# Patient Record
Sex: Male | Born: 1960 | Race: Black or African American | Hispanic: No | Smoking: Current every day smoker
Health system: Southern US, Community
[De-identification: ages and names within clinical notes are randomized; demographics above are authoritative.]

## PROBLEM LIST (undated history)

## (undated) DIAGNOSIS — R569 Unspecified convulsions: Secondary | ICD-10-CM

## (undated) DIAGNOSIS — I10 Essential (primary) hypertension: Secondary | ICD-10-CM

## (undated) DIAGNOSIS — F10239 Alcohol dependence with withdrawal, unspecified: Secondary | ICD-10-CM

## (undated) DIAGNOSIS — F10939 Alcohol use, unspecified with withdrawal, unspecified: Secondary | ICD-10-CM

## (undated) DIAGNOSIS — F1010 Alcohol abuse, uncomplicated: Secondary | ICD-10-CM

## (undated) DIAGNOSIS — F1920 Other psychoactive substance dependence, uncomplicated: Secondary | ICD-10-CM

## (undated) HISTORY — PX: BACK SURGERY: SHX140

---

## 2018-10-21 ENCOUNTER — Encounter: Payer: Self-pay | Admitting: Pediatric Intensive Care

## 2018-10-26 ENCOUNTER — Encounter: Payer: Self-pay | Admitting: Pediatric Intensive Care

## 2018-10-26 NOTE — Congregational Nurse Program (Signed)
  Dept: [facility] Nurse Program Note  Date of Encounter: 10/21/2018  Past Medical History: No past medical history on file.  Encounter Details:new client encounter at [facility]. Client has come here from [facility]. He has a long history of alcohol use including admission at detox programs. He has not had a drink in 3 days and states that he is in withdrawal. CIWA 29 with elevated BP. Client referred to [facility] clinic for treatment. Report to [facility] Placey NNP. Lisette Abu RN BSN CNP [phone]

## 2018-11-06 ENCOUNTER — Encounter (HOSPITAL_COMMUNITY): Payer: Self-pay

## 2018-11-06 ENCOUNTER — Other Ambulatory Visit: Payer: Self-pay

## 2018-11-06 ENCOUNTER — Emergency Department (HOSPITAL_COMMUNITY)
Admission: EM | Admit: 2018-11-06 | Discharge: 2018-11-07 | Disposition: A | Discharge status: Home / Self Care | Payer: No Typology Code available for payment source | Attending: Emergency Medicine | Admitting: Emergency Medicine

## 2018-11-06 ENCOUNTER — Emergency Department (HOSPITAL_COMMUNITY): Payer: Self-pay

## 2018-11-06 DIAGNOSIS — F1010 Alcohol abuse, uncomplicated: Secondary | ICD-10-CM | POA: Insufficient documentation

## 2018-11-06 DIAGNOSIS — Z590 Homelessness: Secondary | ICD-10-CM | POA: Insufficient documentation

## 2018-11-06 DIAGNOSIS — R072 Precordial pain: Secondary | ICD-10-CM

## 2018-11-06 DIAGNOSIS — R7989 Other specified abnormal findings of blood chemistry: Secondary | ICD-10-CM | POA: Insufficient documentation

## 2018-11-06 DIAGNOSIS — F329 Major depressive disorder, single episode, unspecified: Secondary | ICD-10-CM | POA: Insufficient documentation

## 2018-11-06 DIAGNOSIS — F332 Major depressive disorder, recurrent severe without psychotic features: Secondary | ICD-10-CM | POA: Diagnosis present

## 2018-11-06 DIAGNOSIS — F17210 Nicotine dependence, cigarettes, uncomplicated: Secondary | ICD-10-CM | POA: Insufficient documentation

## 2018-11-06 DIAGNOSIS — R45851 Suicidal ideations: Secondary | ICD-10-CM | POA: Insufficient documentation

## 2018-11-06 DIAGNOSIS — R778 Other specified abnormalities of plasma proteins: Secondary | ICD-10-CM

## 2018-11-06 DIAGNOSIS — F1410 Cocaine abuse, uncomplicated: Secondary | ICD-10-CM | POA: Diagnosis present

## 2018-11-06 DIAGNOSIS — I10 Essential (primary) hypertension: Secondary | ICD-10-CM | POA: Insufficient documentation

## 2018-11-06 DIAGNOSIS — Y903 Blood alcohol level of 60-79 mg/100 ml: Secondary | ICD-10-CM | POA: Insufficient documentation

## 2018-11-06 DIAGNOSIS — R0789 Other chest pain: Secondary | ICD-10-CM | POA: Insufficient documentation

## 2018-11-06 HISTORY — DX: Essential (primary) hypertension: I10

## 2018-11-06 LAB — TROPONIN I
Troponin I: 0.04 ng/mL (ref <0.03)
Troponin I: 0.06 ng/mL (ref <0.03)
Troponin I: 0.06 ng/mL (ref <0.03)

## 2018-11-06 LAB — COMPREHENSIVE METABOLIC PANEL
ALT: 21 U/L (ref 0–44)
AST: 32 U/L (ref 15–41)
Albumin: 3.5 g/dL (ref 3.5–5.0)
Alkaline Phosphatase: 47 U/L (ref 38–126)
Anion gap: 15 (ref 5–15)
BUN: 7 mg/dL (ref 6–20)
CO2: 22 mmol/L (ref 22–32)
Calcium: 8.8 mg/dL — ABNORMAL LOW (ref 8.9–10.3)
Chloride: 101 mmol/L (ref 98–111)
Creatinine, Ser: 0.92 mg/dL (ref 0.61–1.24)
GFR calc Af Amer: >60 mL/min (ref >60)
GFR calc non Af Amer: >60 mL/min (ref >60)
Glucose, Bld: 81 mg/dL (ref 70–99)
Potassium: 3.7 mmol/L (ref 3.5–5.1)
Sodium: 138 mmol/L (ref 135–145)
Total Bilirubin: 0.6 mg/dL (ref 0.3–1.2)
Total Protein: 7.4 g/dL (ref 6.5–8.1)

## 2018-11-06 LAB — CBC WITH DIFFERENTIAL/PLATELET
Abs Immature Granulocytes: 0.03 10*3/uL (ref 0.00–0.07)
Basophils Absolute: 0 10*3/uL (ref 0.0–0.1)
Basophils Relative: 0 %
Eosinophils Absolute: 0.1 10*3/uL (ref 0.0–0.5)
Eosinophils Relative: 1 %
HCT: 38.4 % — ABNORMAL LOW (ref 39.0–52.0)
Hemoglobin: 12.4 g/dL — ABNORMAL LOW (ref 13.0–17.0)
Immature Granulocytes: 0 %
Lymphocytes Relative: 21 %
Lymphs Abs: 2.2 10*3/uL (ref 0.7–4.0)
MCH: 28.8 pg (ref 26.0–34.0)
MCHC: 32.3 g/dL (ref 30.0–36.0)
MCV: 89.1 fL (ref 80.0–100.0)
Monocytes Absolute: 1.3 10*3/uL — ABNORMAL HIGH (ref 0.1–1.0)
Monocytes Relative: 12 %
Neutro Abs: 6.9 10*3/uL (ref 1.7–7.7)
Neutrophils Relative %: 66 %
Platelets: 269 10*3/uL (ref 150–400)
RBC: 4.31 MIL/uL (ref 4.22–5.81)
RDW: 13.8 % (ref 11.5–15.5)
WBC: 10.6 10*3/uL — ABNORMAL HIGH (ref 4.0–10.5)
nRBC: 0 % (ref 0.0–0.2)

## 2018-11-06 LAB — RAPID URINE DRUG SCREEN, HOSP PERFORMED
Amphetamines: NOT DETECTED
Barbiturates: NOT DETECTED
Benzodiazepines: POSITIVE — AB
Cocaine: POSITIVE — AB
Opiates: NOT DETECTED
Tetrahydrocannabinol: NOT DETECTED

## 2018-11-06 LAB — ETHANOL: Alcohol, Ethyl (B): 70 mg/dL — ABNORMAL HIGH (ref <10)

## 2018-11-06 MED ORDER — LORAZEPAM 1 MG PO TABS
1.0000 mg | ORAL_TABLET | Freq: Once | ORAL | Status: AC
  Administered 2018-11-06: 1 mg via ORAL
  Filled 2018-11-06: qty 1

## 2018-11-06 MED ORDER — HYDROXYZINE HCL 25 MG PO TABS
25.0000 mg | ORAL_TABLET | Freq: Once | ORAL | Status: AC
  Administered 2018-11-06: 16:00:00 25 mg via ORAL
  Filled 2018-11-06: qty 1

## 2018-11-06 NOTE — ED Provider Notes (Signed)
[facility] EMERGENCY DEPARTMENT Provider Note   CSN: [id number] Arrival date & time: 11/06/18  8315    History   Chief Complaint Chief Complaint  Patient presents with  . Suicidal    HPI  Alvin Warren is a 58 y.o. male who presents with SI. PMH significant for HTN, "enlarged heart", polysubstance abuse. He states that he "just can't do it anymore". He is from [place]. 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 His daughter, who lives here in [place], bought him a Psychiatric nurse here. He is homeless and was staying at [facility] but he was moved to a [employer] due to [employer]. He has been using alcohol and cocaine. He last used cocaine last night and drank this morning. He denies self injury but states that he would jump in front of a car. He denies any access to weapons. He denies fever, URI symptoms, cough, SOB, abdominal pain, diarrhea. He was tested for flu and COVID for screening but denies any symptoms. He also reports some dull central chest pain that has been intermittent for days. Nothing makes it better or worse. It's "eased off" some at this point. He has some N/V last night. He is supposed to take antihypertensives and mental health meds but hasn't been taking them.      HPI  Past Medical History:  Diagnosis Date  . Hypertension     There are no active problems to display for this patient.   History reviewed. No pertinent surgical history.      Home Medications    Prior to Admission medications   Not on File    Family History No family history on file.  Social History Social History   Tobacco Use  . Smoking status: Current Every Day Smoker    Packs/day: 0.50    Types: Cigarettes  Substance Use Topics  . Alcohol use: Yes  . Drug use: Yes    Types: Cocaine    Comment: last used crack cocaine last night     Allergies   Patient has no allergy information on record.   Review of Systems Review of Systems  Constitutional: Negative for fever.   Respiratory: Negative for cough and shortness of breath.   Cardiovascular: Positive for chest pain. Negative for palpitations and leg swelling.  Gastrointestinal: Positive for vomiting. Negative for abdominal pain, diarrhea and nausea.  Psychiatric/Behavioral: Positive for dysphoric mood and suicidal ideas. Negative for self-injury.  All other systems reviewed and are negative.    Physical Exam Updated Vital Signs BP (!) 145/88   Pulse 82   Temp 98.4 F (36.9 C) (Oral)   Resp 16   Ht 6\' 3"  (1.905 m)   Wt 99.8 kg   SpO2 96%   BMI 27.50 kg/m   Physical Exam Vitals signs and nursing note reviewed.  Constitutional:      General: He is not in acute distress.    Appearance: Normal appearance. He is well-developed. He is not ill-appearing.  HENT:     Head: Normocephalic and atraumatic.  Eyes:     General: No scleral icterus.       Right eye: No discharge.        Left eye: No discharge.     Conjunctiva/sclera: Conjunctivae normal.     Pupils: Pupils are equal, round, and reactive to light.  Neck:     Musculoskeletal: Normal range of motion.  Cardiovascular:     Rate and Rhythm: Normal rate and regular rhythm.  Pulmonary:  Effort: Pulmonary effort is normal. No respiratory distress.     Breath sounds: Normal breath sounds.  Abdominal:     General: There is no distension.     Palpations: Abdomen is soft.     Tenderness: There is no abdominal tenderness.  Skin:    General: Skin is warm and dry.  Neurological:     Mental Status: He is alert and oriented to person, place, and time.  Psychiatric:        Mood and Affect: Mood is depressed. Affect is flat.        Speech: Speech is delayed (soft spoken).        Behavior: Behavior is slowed. Behavior is cooperative.        Thought Content: Thought content includes suicidal ideation. Thought content includes suicidal plan.        Cognition and Memory: Cognition is impaired.      ED Treatments / Results  Labs (all labs  ordered are listed, but only abnormal results are displayed) Labs Reviewed  COMPREHENSIVE METABOLIC PANEL - Abnormal; Notable for the following components:      Result Value   Calcium 8.8 (*)    All other components within normal limits  ETHANOL - Abnormal; Notable for the following components:   Alcohol, Ethyl (B) 70 (*)    All other components within normal limits  RAPID URINE DRUG SCREEN, HOSP PERFORMED - Abnormal; Notable for the following components:   Cocaine POSITIVE (*)    Benzodiazepines POSITIVE (*)    All other components within normal limits  CBC WITH DIFFERENTIAL/PLATELET - Abnormal; Notable for the following components:   WBC 10.6 (*)    Hemoglobin 12.4 (*)    HCT 38.4 (*)    Monocytes Absolute 1.3 (*)    All other components within normal limits  TROPONIN I - Abnormal; Notable for the following components:   Troponin I 0.04 (*)    All other components within normal limits  TROPONIN I - Abnormal; Notable for the following components:   Troponin I 0.06 (*)    All other components within normal limits  TROPONIN I    EKG EKG Interpretation  Date/Time:  Friday November 06 2018 08:55:13 EDT Ventricular Rate:  81 PR Interval:    QRS Duration: 87 QT Interval:  369 QTC Calculation: 429 R Axis:   36 Text Interpretation:  Sinus rhythm Left atrial enlargement no prior to compare with Confirmed by Aletta Edouard [phone]) on 11/06/2018 8:59:25 AM   Radiology Dg Chest 2 View  Result Date: 11/06/2018 CLINICAL DATA:  Chest pain EXAM: CHEST - 2 VIEW COMPARISON:  None. FINDINGS: Lungs are clear. Heart is borderline enlarged with pulmonary vascularity normal. No adenopathy. No pneumothorax. No bone lesions. IMPRESSION: Borderline cardiac enlargement.  No edema or consolidation. Electronically Signed   By: Lowella Grip III M.D.   On: 11/06/2018 09:32    Procedures Procedures (including critical care time)  Medications Ordered in ED Medications  hydrOXYzine  (ATARAX/VISTARIL) tablet 25 mg (has no administration in time range)     Initial Impression / Assessment and Plan / ED Course  I have reviewed the triage vital signs and the nursing notes.  Pertinent labs & imaging results that were available during my care of the patient were reviewed by me and considered in my medical decision making (see chart for details).  58 year old male presents with SI. He mentions some chest pain that he's been having on and off for several days which  sounds atypical in nature. He endorses cocaine use along with alcohol. He is hypertensive but otherwise vitals are normal. EKG is SR. CXR shows borderline cardiac enlargement. Initial trop is 0.04. Labs are unremarkable. UDS shows cocaine and benzos. ETOH is 70.  2nd trop is 0.06. Discussed with Cardiology - they do not feel he needs any further cardiac work up and recommend to encourage cocaine cessation. Discussed this with patient. He wants to stop his substance abuse but he has used for years. 3rd Trop is pending at shift change. Care signed out to Dr. Sabra Heck.  Final Clinical Impressions(s) / ED Diagnoses   Final diagnoses:  Suicidal ideation  Cocaine abuse ([place])  Alcohol abuse  Elevated troponin    ED Discharge Orders    None       Recardo Evangelist, PA-C 11/06/18 1600    Noemi Chapel, MD 11/06/18 Kevan Ny    Noemi Chapel, MD 11/06/18 2046

## 2018-11-06 NOTE — ED Notes (Signed)
Dr. Butler notified of elevated troponin 

## 2018-11-06 NOTE — ED Triage Notes (Addendum)
Pt from [facility] via ems; usually stays at [facility], was moved to [facility] due to [employer]; hx depression, increased in last couple of days; binge drinking last night, used crack cocaine yesterday; denies daily drinking;  pt also c/o central CP x a couple of days, non-radiating, dull pressure; pt stated to ems that he "just wants to be done, doesn't want to live anymore"; no hx suicide attempt; current plan to jump in traffic; tested for flu and covid-19 recently, results negative; hx HTN, last took lisinopril 2-3 days ago, states "I didn't bring it with me"  170/102 HR 90 98.8f temporal

## 2018-11-06 NOTE — ED Notes (Signed)
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

## 2018-11-06 NOTE — ED Notes (Signed)
Pt was out in lobby and looking for ride to [facility].  Spoke with patient and he said he came in with SI and Chest Pain.  Pt said he is suicidal and trying to get to [facility] because he was told we don't do that here. Reviewed chart and patient has had sitter the entire time and no TTS or note from behavioral health.  Undischarged patient and brought back into [facility]. Pt was not refusing care.

## 2018-11-06 NOTE — ED Notes (Signed)
Pt A&O x 3, no acute distress noted, calm & cooperative, presents with SI, plan to run in front of car.  Feeling hopeless.  Denies previous SI attempts.  Denies HI or AVH.  Pt is homeless.  Monitoring for safety, !-1 sitter at bedside.

## 2018-11-06 NOTE — ED Triage Notes (Signed)
Pt seen earlier for chest pain and SI. Pt continues with SI and feeling hopeless. Pt endorses ETOH abuse with last drink yesterday morning. Pt endorses specific plan for SI. No pain at this time

## 2018-11-06 NOTE — ED Notes (Signed)
Patients wallet returned- number on valuables envelope [phone]

## 2018-11-06 NOTE — Discharge Instructions (Addendum)
Please stop using Cocaine immediately Reduce the amount of alcohol that you drink Please see the doctor at the clinic above - call today for appointment ER for worsening pain in the chest  Substance Abuse Treatment Programs  Intensive Outpatient Programs [facility]     [address], [place]       [facility] [place] [address] [place], [place]  [facility]     (Inpatient and outpatient)     [address]           [place] [phone] (Suboxone and Methadone)  [place], [place] [address]      [place] [place] [address] [place], [place]  [facility] [place] [facility] (Outpatient/Inpatient, [place])    (insurance only) [phone]             [facility] ([place]) [place], [place]     [facility]     [address]     [place], [place]       [facility] (for caregivers and family) [phone] [address] Kristeen Mans. [place], [place]      Residential Treatment Programs [facility]      [address] [place], [place] [address]  [phone]       [place] [place], [place] [address] [phone]  [facility]        [phone]       [facility] [phone]  [facility] ([place].)             [place], [place] or [place] of [place], [address] [phone]  [facility]    [address]      [place], [place]       [facility] [address] [place], [place]  Star   [address] [place], [place] [address]     [phone]      Admissions: 8am-3pm M-F  [facility] (RTS) [address] [place], [place]  [facility] Program: Residential Program [phone] Days)   [place], [place] or [phone]     [facility]: [facility] [place], [place] (Walk in Hours over the weekend or by referral)  [facility] [address] [place], [place], [place] [address] [phone]  Crisis Mobile: [facility]:  [phone] (for crisis response 24 hours a day) [facility] Hotline:      [phone] Outpatient Psychiatry and Counseling  [facility]: Mobile Crisis Management 24 hours:  [phone]  [facility] [employer] sliding scale fee and walk in schedule: M-F 8am-12pm/1pm-3pm [place], [place] [address] [place], [place] [address] [phone]  [facility] ([facility])- new patient walk-in appointments available Monday - Friday 8am -3pm.          [address] [place], [place] [address] [phone] or crisis line- [place] [facility]/ Intensive Outpatient Therapy Program [place], [place] [address] [place]      [phone] [address] [place], [place] [address]                 [place]   [facility] [phone]. [place], [place] [address]   [employer] [facility] of Care          [address] Johnette Abraham  [place], [place] [address]       [phone]  [facility] [address], [place], [place] [address] [phone]  [facility]    [address] [place],  [place] [address]     [place], [place] Joycelyn Man     [place] [address]     [phone]       [facility] [place] [address]  [facility]     [address], [place], [place], [place] [address] [place]     [address]     [place], [place] [address]     [phone]       [facility] [address] [place], [place] [address] [phone] Resources for Temporary Residential Assistance/Crisis [facility]) M-F 8am-3pm   [address], [place] [address]   [phone] Services include: laundry, barbering, support groups, case management, phone  & computer access, showers, AA/NA mtgs, mental health/substance abuse nurse, job skills class, disability information, VA assistance, spiritual classes, etc.   [facility]   [address] [place]     [place]              [employer] (women and children)       [place]. [place],  [address] [phone] [email or web address] for application and process Application Required  [facility]   [address]    [place] [address]     [phone]                    [place] [address] [place],  [address] [phone]  [phone](ORVIFBPP application appt.) Application Required  [facility] (women only)    [place], [place] [address]     [phone]      Intake starts 6pm daily Need valid ID, SSC, & Police report [facility] [address] [place], [place] [phone] Application Required  [employer] (men only)     [place].      [place], [place]       [facility] (Pregnant women only) [address] [place], [place]  [facility]      [address] Dan[address].      [place], [place] [address]     [phone]             [facility] [address] [place], [place] 90 day commitment/SA/Application process  [facility](men only)     [address]     [place], [place]       Check-in at [facility] [address] [place], [place] [address] [phone] Men/Women/Women and Children must be there by 7 pm  [place], [place]

## 2018-11-06 NOTE — ED Notes (Signed)
Patient transported to X-ray 

## 2018-11-06 NOTE — ED Notes (Signed)
TTS consult in progress/Telepsych by Lytle Michaels at present.

## 2018-11-07 ENCOUNTER — Encounter (HOSPITAL_COMMUNITY): Payer: Self-pay

## 2018-11-07 ENCOUNTER — Other Ambulatory Visit: Payer: Self-pay

## 2018-11-07 ENCOUNTER — Inpatient Hospital Stay (HOSPITAL_COMMUNITY)
Admission: AD | Admit: 2018-11-07 | Discharge: 2018-11-12 | DRG: 885 | Disposition: A | Discharge status: Other Acute Inpatient Hospital | Payer: No Typology Code available for payment source | Source: Intra-hospital | Attending: Psychiatry | Admitting: Psychiatry

## 2018-11-07 DIAGNOSIS — F4310 Post-traumatic stress disorder, unspecified: Secondary | ICD-10-CM | POA: Diagnosis present

## 2018-11-07 DIAGNOSIS — F1424 Cocaine dependence with cocaine-induced mood disorder: Secondary | ICD-10-CM | POA: Diagnosis not present

## 2018-11-07 DIAGNOSIS — F332 Major depressive disorder, recurrent severe without psychotic features: Secondary | ICD-10-CM | POA: Diagnosis present

## 2018-11-07 DIAGNOSIS — Z79899 Other long term (current) drug therapy: Secondary | ICD-10-CM | POA: Diagnosis not present

## 2018-11-07 DIAGNOSIS — F1020 Alcohol dependence, uncomplicated: Secondary | ICD-10-CM | POA: Diagnosis present

## 2018-11-07 DIAGNOSIS — F17210 Nicotine dependence, cigarettes, uncomplicated: Secondary | ICD-10-CM | POA: Diagnosis present

## 2018-11-07 DIAGNOSIS — Z590 Homelessness: Secondary | ICD-10-CM

## 2018-11-07 DIAGNOSIS — R45851 Suicidal ideations: Secondary | ICD-10-CM | POA: Diagnosis present

## 2018-11-07 DIAGNOSIS — F1410 Cocaine abuse, uncomplicated: Secondary | ICD-10-CM | POA: Diagnosis present

## 2018-11-07 DIAGNOSIS — I10 Essential (primary) hypertension: Secondary | ICD-10-CM | POA: Diagnosis present

## 2018-11-07 HISTORY — DX: Unspecified convulsions: R56.9

## 2018-11-07 HISTORY — DX: Alcohol dependence with withdrawal, unspecified: F10.239

## 2018-11-07 HISTORY — DX: Alcohol use, unspecified with withdrawal, unspecified: F10.939

## 2018-11-07 MED ORDER — LOPERAMIDE HCL 2 MG PO CAPS: 2.0000 mg | ORAL_CAPSULE | ORAL | Status: AC | PRN

## 2018-11-07 MED ORDER — HYDROXYZINE HCL 25 MG PO TABS
25.0000 mg | ORAL_TABLET | Freq: Four times a day (QID) | ORAL | Status: AC | PRN
  Administered 2018-11-07 – 2018-11-09 (×4): 25 mg via ORAL
  Filled 2018-11-07 (×4): qty 1

## 2018-11-07 MED ORDER — SERTRALINE HCL 50 MG PO TABS
50.0000 mg | ORAL_TABLET | Freq: Every day | ORAL | Status: DC
  Administered 2018-11-07 – 2018-11-09 (×3): 50 mg via ORAL
  Filled 2018-11-07 (×5): qty 1

## 2018-11-07 MED ORDER — ACETAMINOPHEN 325 MG PO TABS
650.0000 mg | ORAL_TABLET | Freq: Four times a day (QID) | ORAL | Status: DC | PRN
  Administered 2018-11-07 – 2018-11-11 (×6): 650 mg via ORAL
  Filled 2018-11-07 (×6): qty 2

## 2018-11-07 MED ORDER — THIAMINE HCL 100 MG/ML IJ SOLN
100.0000 mg | Freq: Once | INTRAMUSCULAR | Status: AC
  Administered 2018-11-07: 100 mg via INTRAMUSCULAR
  Filled 2018-11-07: qty 2

## 2018-11-07 MED ORDER — HYDROXYZINE HCL 25 MG PO TABS: 25.0000 mg | ORAL_TABLET | Freq: Three times a day (TID) | ORAL | Status: DC | PRN

## 2018-11-07 MED ORDER — ALUM & MAG HYDROXIDE-SIMETH 200-200-20 MG/5ML PO SUSP: 30.0000 mL | ORAL | Status: DC | PRN

## 2018-11-07 MED ORDER — MAGNESIUM HYDROXIDE 400 MG/5ML PO SUSP: 30.0000 mL | Freq: Every day | ORAL | Status: DC | PRN

## 2018-11-07 MED ORDER — TRAZODONE HCL 50 MG PO TABS
50.0000 mg | ORAL_TABLET | Freq: Every evening | ORAL | Status: DC | PRN
  Administered 2018-11-08 – 2018-11-11 (×4): 50 mg via ORAL
  Filled 2018-11-07 (×5): qty 1

## 2018-11-07 MED ORDER — ADULT MULTIVITAMIN W/MINERALS CH
1.0000 | ORAL_TABLET | Freq: Every day | ORAL | Status: DC
  Administered 2018-11-07 – 2018-11-11 (×5): 1 via ORAL
  Filled 2018-11-07 (×8): qty 1

## 2018-11-07 MED ORDER — ONDANSETRON 4 MG PO TBDP: 4.0000 mg | ORAL_TABLET | Freq: Four times a day (QID) | ORAL | Status: AC | PRN

## 2018-11-07 MED ORDER — CHLORDIAZEPOXIDE HCL 25 MG PO CAPS
25.0000 mg | ORAL_CAPSULE | Freq: Four times a day (QID) | ORAL | Status: AC | PRN
  Administered 2018-11-07 – 2018-11-09 (×4): 25 mg via ORAL
  Filled 2018-11-07 (×4): qty 1

## 2018-11-07 MED ORDER — LISINOPRIL 10 MG PO TABS
10.0000 mg | ORAL_TABLET | Freq: Every day | ORAL | Status: DC
  Administered 2018-11-07 – 2018-11-11 (×5): 10 mg via ORAL
  Filled 2018-11-07 (×2): qty 1
  Filled 2018-11-07: qty 14
  Filled 2018-11-07 (×4): qty 1
  Filled 2018-11-07: qty 2

## 2018-11-07 MED ORDER — PNEUMOCOCCAL VAC POLYVALENT 25 MCG/0.5ML IJ INJ
0.5000 mL | INJECTION | INTRAMUSCULAR | Status: DC
  Filled 2018-11-07: qty 0.5

## 2018-11-07 MED ORDER — VITAMIN B-1 100 MG PO TABS
100.0000 mg | ORAL_TABLET | Freq: Every day | ORAL | Status: DC
  Administered 2018-11-08 – 2018-11-11 (×4): 100 mg via ORAL
  Filled 2018-11-07 (×6): qty 1

## 2018-11-07 MED ORDER — NICOTINE 14 MG/24HR TD PT24
14.0000 mg | MEDICATED_PATCH | Freq: Every day | TRANSDERMAL | Status: DC
  Administered 2018-11-07 – 2018-11-11 (×5): 14 mg via TRANSDERMAL
  Filled 2018-11-07 (×8): qty 1

## 2018-11-07 NOTE — ED Notes (Signed)
Pt in shower.  
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
## 2018-11-07 NOTE — Progress Notes (Signed)
Adult Psychoeducational Group Note  Date:  11/07/2018 Time:  8:51 PM  Group Topic/Focus:  Wrap-Up Group:   The focus of this group is to help patients review their daily goal of treatment and discuss progress on daily workbooks.  Participation Level:  Active  Participation Quality:  Appropriate  Affect:  Flat  Cognitive:  Alert  Insight: Lacking  Engagement in Group:    Modes of Intervention:  Discussion  Additional Comments:  Pt rated his day 1/10. The most helpful coping skill that he has learned is to ask for help. Pt stated that he wants to learn how to stay sober and to stay out of his head.   Wynelle Fanny R 11/07/2018, 8:51 PM

## 2018-11-07 NOTE — Tx Team (Signed)
Initial Treatment Plan 11/07/2018 5:08 PM [name] YEB:[id number]    PATIENT STRESSORS:  Financial difficulties Substance abuse   PATIENT STRENGTHS: Capable of independent living Communication skills   PATIENT IDENTIFIED PROBLEMS: 1. "Get back into a long term program"  2. "Get back on meds"                   DISCHARGE CRITERIA:  Verbal commitment to aftercare and medication compliance Withdrawal symptoms are absent or subacute and managed without 24-hour nursing intervention  PRELIMINARY DISCHARGE PLAN: Attend 12-step recovery group  PATIENT/FAMILY INVOLVEMENT: This treatment plan has been presented to and reviewed with the patient, Alvin Warren.  The patient has been given the opportunity to ask questions and make suggestions.  Lesli Albee, RN 11/07/2018, 5:08 PM
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
## 2018-11-07 NOTE — ED Provider Notes (Signed)
Pt reports he feels well.  No complaints.  Pt awaiting telepsych.    Fransico Meadow, [place] 11/07/18 [id number]    Julianne Rice, MD 11/08/18 [phone]

## 2018-11-07 NOTE — BHH Suicide Risk Assessment (Signed)
[facility] Admission Suicide Risk Assessment   Nursing information obtained from:   Patient and chart Demographic factors:   58 year old male, currently homeless, unemployed Current Mental Status:   See below Loss Factors:   Unemployment, homelessness Historical Factors:   Depression, alcohol and cocaine use disorder Risk Reduction Factors:   Resilience  Total Time spent with patient: 45 minutes Principal Problem: MDD , cocaine use disorder  Diagnosis:  Active Problems:   MDD (major depressive disorder), recurrent episode, severe (HCC)  Subjective Data:  Continued Clinical Symptoms:    The "Alcohol Use Disorders Identification Test", Guidelines for Use in Primary Care, Second Edition.  World Pharmacologist [facility]). Score between 0-7:  no or low risk or alcohol related problems. Score between 8-15:  moderate risk of alcohol related problems. Score between 16-19:  high risk of alcohol related problems. Score 20 or above:  warrants further diagnostic evaluation for alcohol dependence and treatment.   CLINICAL FACTORS:  58 year old male, currently homeless, unemployed.  Presented to ED for chest pain, for which she was medically cleared.  Reported also worsening depression, with recent suicidal ideations of walking into traffic or jumping from a bridge.  Endorses neurovegetative symptoms of depression.  History of cocaine/alcohol abuse.  Reports he relapsed recently after a period of several weeks of sobriety.  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      Psychiatric Specialty Exam: Physical Exam  ROS  There were no vitals taken for this visit.There is no height or weight on file to calculate BMI.  See admit note MSE   COGNITIVE FEATURES THAT CONTRIBUTE TO RISK:  Closed-mindedness and Loss of executive function    SUICIDE RISK:   Moderate:  Frequent suicidal ideation with limited intensity, and duration,  some specificity in terms of plans, no associated intent, good self-control, limited dysphoria/symptomatology, some risk factors present, and identifiable protective factors, including available and accessible social support.  PLAN OF CARE: Patient will be admitted to inpatient psychiatric unit for stabilization and safety. Will provide and encourage milieu participation. Provide medication management and maked adjustments as needed. Will also provide medication management to address alcohol WDL symptoms if needed  Will follow daily.    I certify that inpatient services furnished can reasonably be expected to improve the patient's condition.   Jenne Campus, MD 11/07/2018, 4:00 PM

## 2018-11-07 NOTE — Progress Notes (Signed)
Patient has been accepted to [facility] as a voluntary admission. Voluntarily admission paperwork will need to be faxed to [phone] prior to transport.  Bed number: 307-2  Accepting provider: Dr.Cobos  RN Call for report: [phone]  Patient may arrive after 10:00am  Stephanie Acre, [place] Social Worker [phone]
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
## 2018-11-07 NOTE — Progress Notes (Signed)
D: Patient arrived to [facility] Adult unit from [facility] voluntarily. Patient is currently homeless, and was recently asked to leave his sister's home. He has a hx of cocaine and alcohol abuse and recently relapsed. He was in [place] for rehab with [facility], and relapsed shortly after completing the program. He feels hopeless and is ashamed what his children (2) will think. He is suicidal with a plan to walk into traffic. His alcohol level was 70. UDS+benzos and cocaine, although he was given benzos prior to his UDS. He is tremulous and scores a 10 on CIWA scale.  A: Skin assessment performed per protocol, no contraband noted, and skin unremarkable. Patient had no belongings at time of admission. Unit orientation completed. Care plan and unit routines reviewed with patient, understanding verbalized. Emotional support offered to patient. Encouraged patient to voice concerns. Fluids offered to patient. Q15 minute checks initiated for safety on and off unit.  R: Patient verbalizes no concerns and contracts for safety.

## 2018-11-07 NOTE — BHH Group Notes (Signed)
[facility] Group Notes:  (Nursing/MHT/Case Management/Adjunct)  Date:  11/07/2018  Time:130 Type of Therapy:  Nurse Education  Participation Level:  Did Not Attend   Waymond Cera 11/07/2018, 7:49 PM

## 2018-11-07 NOTE — Progress Notes (Addendum)
D: Pt was at nurse's station upon initial approach.  Pt presents with depressed affect and mood.  He reports he feels "about the same."  His goal is "trying to pray and get better."  Pt denies HI, denies hallucinations, reports R sided rib pain of 7/10.  He endorses passive SI without plan or intent.  Pt verbally contracts for safety.  Pt has been visible in milieu interacting with peers and staff cautiously.    A: Introduced self to pt.  Met with pt 1:1.  Actively listened to pt and offered support and encouragement.  PRN medication administered for anxiety and pain.  Q15 minute safety checks maintained.  R: Pt is safe on the unit.  Pt is compliant with medications.  Pt verbally contracts for safety.  Will continue to monitor and assess.

## 2018-11-07 NOTE — ED Notes (Signed)
Telepsych being performed. 

## 2018-11-07 NOTE — ED Notes (Signed)
Ordered breakfast 

## 2018-11-07 NOTE — BHH Counselor (Signed)
Pt reported, having family supports. Clinician asked the pt if she could contact his family to gather additional collateral information. Pt declined.     Vertell Novak, [place], [facility], [facility] Triage Specialist [phone]

## 2018-11-07 NOTE — Progress Notes (Signed)
CSW met with patient to discuss discharge options and follow-up. CSW confirmed with [facility] patient's bed is not longer available. CSW discussed with this patient. CSW informed him at this time PART bus is not running and there is not other supports the hospital can provide to go to [place] but would be able to help patient get somewhere in [place]. CSW noted patient stated "I give up, I'm just a disappointment to everywhere." CSW attempted to process this with patient and noted patient clarified he did not want to continue speaking to CSW. CSW is following for additional supports if needed.  Lamonte Richer, LCSW, [place] Worker II [phone]

## 2018-11-07 NOTE — ED Notes (Signed)
Pt voiced understanding and agreement w/tx plan - accepted to [facility] - pt signed consent form - copy faxed to [facility] - copy sent to Medical Records - and original placed in envelope for [facility]. Offered for pt to call someone to advise - declined.

## 2018-11-08 LAB — TSH: TSH: 0.754 u[IU]/mL (ref 0.350–4.500)

## 2018-11-08 NOTE — Plan of Care (Addendum)
D: Patient presents calm and cooperative. He is moving slowly and cautiously, and continues to report he feels unsteady. Safety precautions reinforced. Patient denies HI/AVH. He continues to endorse suicidal thoughts without intent. He intends to carry out suicide after discharge because he feels it would be best for everyone. Patient slept poorly last night, despite receiving medication. His appetite is poor, energy low, and concentration poor. He rates his depression 10/10, hopelessness 5/10, and anxiety 8/10. He complains of tremors, chills, and irritability. He has chest pain 5/10. Goal is "get a plan to get help so I can one day be a better people. Learn how to fight my mind and addiction." A: Patient checked q15 min, and checks reviewed. Reviewed medication changes with patient and educated on side effects. Educated patient on importance of attending group therapy sessions and educated on several coping skills. Encouarged participation in milieu through recreation therapy and attending meals with peers. Support and encouragement provided. Fluids offered. R: Patient receptive to education on medications, and is medication compliant. Patient contracts for safety on the unit.  Problem: Medication: Goal: Compliance with prescribed medication regimen will improve Outcome: Progressing   Problem: Self-Concept: Goal: Ability to disclose and discuss suicidal ideas will improve Outcome: Progressing   Problem: Self-Concept: Goal: Will verbalize positive feelings about self Outcome: Not Progressing

## 2018-11-08 NOTE — Progress Notes (Signed)
NUTRITION ASSESSMENT  Pt identified as at risk on the Malnutrition Screen Tool  INTERVENTION: 1. Supplements: No needs identified at this time.  NUTRITION DIAGNOSIS: Unintentional weight loss related to sub-optimal intake as evidenced by pt report.   Goal: Pt to meet >/= 90% of their estimated nutrition needs.  Monitor:  PO intake  Assessment:  Pt admitted with depression and substance abuse (ETOH, crack cocaine). Pt reports poor appetite. No weight history available in chart. No needs for supplements identified at this time.  Height: Ht Readings from Last 1 Encounters:  11/07/18 6\' 3"  (1.905 m)    Weight: Wt Readings from Last 1 Encounters:  11/07/18 97.5 kg    Weight Hx: Wt Readings from Last 10 Encounters:  11/07/18 97.5 kg  11/06/18 99.7 kg    BMI:  Body mass index is 26.87 kg/m. Pt meets criteria for overweight based on current BMI.  Estimated Nutritional Needs: Kcal: 25-30 kcal/kg Protein: > 1 gram protein/kg Fluid: 1 ml/kcal  Diet Order:  Diet Order            Diet regular Room service appropriate? Yes; Fluid consistency: Thin  Diet effective now             Pt is also offered choice of unit snacks mid-morning and mid-afternoon.  Pt is eating as desired.   Lab results and medications reviewed.   Clayton Bibles, MS, RD, [place] Dietitian Pager: [phone] After Hours Pager: [phone]

## 2018-11-08 NOTE — BHH Group Notes (Signed)
Pt was invited but did not attend orientation/goals group. 

## 2018-11-08 NOTE — BHH Suicide Risk Assessment (Signed)
[facility] INPATIENT:  Family/Significant Other Suicide Prevention Education  Suicide Prevention Education:  Patient Refusal for Family/Significant Other Suicide Prevention Education: The patient Alvin Warren has refused to provide written consent for family/significant other to be provided Family/Significant Other Suicide Prevention Education during admission and/or prior to discharge.  Physician notified.  Berlin Hun Grossman-Orr 11/08/2018, 4:40 PM

## 2018-11-08 NOTE — Progress Notes (Signed)
Adult Psychoeducational Group Note  Date:  11/08/2018 Time:  9:21 PM  Group Topic/Focus:  Wrap-Up Group:   The focus of this group is to help patients review their daily goal of treatment and discuss progress on daily workbooks.  Participation Level:  Active  Participation Quality:  Appropriate  Affect:  Appropriate  Cognitive:  Appropriate  Insight: Appropriate  Engagement in Group:  Engaged  Modes of Intervention:  Discussion  Additional Comments:  Pt stated his goal was to talk to someone about his transitioning plan and he did speak with the social worker about that.  Pt rated the day at a 8/10.  Naesha Buckalew 11/08/2018, 9:21 PM
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
## 2018-11-08 NOTE — Progress Notes (Signed)
[facility] MD Progress Note  11/08/2018 10:49 AM Zeev Deakins  MRN:  [id number] Subjective:  Patient reports some improvement , but describes lingering depression, sadness.   Objective : I have reviewed chart notes and have met with patient . 58 year old male, currently homeless, unemployed.  Presented to ED for chest pain, for which he was medically cleared.  Reported also worsening depression, with recent suicidal ideations of walking into traffic or jumping from a bridge.  Endorses neurovegetative symptoms of depression.  History of cocaine/alcohol abuse. Reports he relapsed recently after a period of several weeks of sobriety.  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   Patient remains depressed, constricted, but states he is feeling somewhat better . He has endorsed passive SI to Nursing staff . At this time, describes  stressors including homelessness and current unemployment and reports some intermittent thoughts of " feeling like I am not contributing , like I am a burden", but currently denies suicidal plan or intention and contracts for safety on unit  .  Denies medication side effects. No significant WDL symptoms at this time-  No  tremors or diaphoresis, does not appear to be in any acute distress, no psychomotor agitation or restlessness noted . BP 129/96, pulse 85.  Behavior on unit in good control, polite on approach.  Labs - TSH 0.754   Principal Problem: Depression  Diagnosis: Active Problems:   MDD (major depressive disorder), recurrent episode, severe (HCC)  Total Time spent with patient: 20 minutes  Past Psychiatric History:   Past Medical History:  Past Medical History:  Diagnosis Date  . Alcohol withdrawal seizure ([place])   . Hypertension    History reviewed. No pertinent surgical history. Family History: History reviewed. No pertinent family history. Family Psychiatric  History:  Social History:   Social History   Substance and Sexual Activity  Alcohol Use Yes   Comment: Patient is not sure how much he has been drinking     Social History   Substance and Sexual Activity  Drug Use Yes  . Types: Cocaine   Comment: last used crack cocaine last night    Social History   Socioeconomic History  . Marital status: Single    Spouse name: Not on file  . Number of children: Not on file  . Years of education: Not on file  . Highest education level: Not on file  Occupational History  . Not on file  Social Needs  . Financial resource strain: Not on file  . Food insecurity:    Worry: Not on file    Inability: Not on file  . Transportation needs:    Medical: Not on file    Non-medical: Not on file  Tobacco Use  . Smoking status: Current Every Day Smoker    Packs/day: 0.50    Types: Cigarettes  . Smokeless tobacco: Never Used  Substance and Sexual Activity  . Alcohol use: Yes    Comment: Patient is not sure how much he has been drinking  . Drug use: Yes    Types: Cocaine    Comment: last used crack cocaine last night  . Sexual activity: Yes    Birth control/protection: Condom  Lifestyle  . Physical activity:    Days per week: Not on file    Minutes per session: Not on file  . Stress: Not on file  Relationships  . Social connections:    Talks on phone: Not  on file    Gets together: Not on file    Attends religious service: Not on file    Active member of club or organization: Not on file    Attends meetings of clubs or organizations: Not on file    Relationship status: Not on file  Other Topics Concern  . Not on file  Social History Narrative  . Not on file   Additional Social History:    Sleep: improving   Appetite:  Fair  Current Medications: Current Facility-Administered Medications  Medication Dose Route Frequency Provider Last Rate Last Dose  . acetaminophen (TYLENOL) tablet 650 mg  650 mg Oral Q6H PRN Money, Lowry Ram, FNP   650 mg at 11/08/18 0542   . alum & mag hydroxide-simeth (MAALOX/MYLANTA) 200-200-20 MG/5ML suspension 30 mL  30 mL Oral Q4H PRN Money, Lowry Ram, FNP      . chlordiazePOXIDE (LIBRIUM) capsule 25 mg  25 mg Oral Q6H PRN Chrys Landgrebe, Myer Peer, MD   25 mg at 11/07/18 1610  . hydrOXYzine (ATARAX/VISTARIL) tablet 25 mg  25 mg Oral Q6H PRN Murline Weigel, Myer Peer, MD   25 mg at 11/07/18 1940  . lisinopril (ZESTRIL) tablet 10 mg  10 mg Oral Daily Sujey Gundry, Myer Peer, MD   10 mg at 11/08/18 0805  . loperamide (IMODIUM) capsule 2-4 mg  2-4 mg Oral PRN Jacquees Gongora, Myer Peer, MD      . magnesium hydroxide (MILK OF MAGNESIA) suspension 30 mL  30 mL Oral Daily PRN Money, Darnelle Maffucci B, FNP      . multivitamin with minerals tablet 1 tablet  1 tablet Oral Daily Wille Aubuchon, Myer Peer, MD   1 tablet at 11/08/18 0805  . nicotine (NICODERM CQ - dosed in mg/24 hours) patch 14 mg  14 mg Transdermal Daily Falicia Lizotte, Myer Peer, MD   14 mg at 11/08/18 0805  . ondansetron (ZOFRAN-ODT) disintegrating tablet 4 mg  4 mg Oral Q6H PRN Celine Dishman, Myer Peer, MD      . pneumococcal 23 valent vaccine (PNU-IMMUNE) injection 0.5 mL  0.5 mL Intramuscular Tomorrow-1000 Leiby Pigeon A, MD      . sertraline (ZOLOFT) tablet 50 mg  50 mg Oral Daily Madalyne Husk, Myer Peer, MD   50 mg at 11/08/18 0805  . thiamine (VITAMIN B-1) tablet 100 mg  100 mg Oral Daily Sumeet Geter, Myer Peer, MD   100 mg at 11/08/18 0805  . traZODone (DESYREL) tablet 50 mg  50 mg Oral QHS PRN Money, Lowry Ram, FNP        Lab Results:  Results for orders placed or performed during the hospital encounter of 11/07/18 (from the past 48 hour(s))  TSH     Status: None   Collection Time: 11/08/18  5:57 AM  Result Value Ref Range   TSH 0.754 0.350 - 4.500 uIU/mL    Comment: Performed by a 3rd Generation assay with a functional sensitivity of <=0.01 uIU/mL. Performed at [facility], [place] [address], [place], [place] [address]     Blood Alcohol level:  Lab Results  Component Value Date   ETH 70 (H) 24/26/8341     Metabolic Disorder Labs: No results found for: HGBA1C, MPG No results found for: PROLACTIN No results found for: CHOL, TRIG, HDL, CHOLHDL, VLDL, LDLCALC  Physical Findings: AIMS: Facial and Oral Movements Muscles of Facial Expression: None, normal Lips and Perioral Area: None, normal Jaw: None, normal Tongue: None, normal,Extremity Movements Upper (arms, wrists, hands, fingers): None, normal Lower (legs, knees, ankles,  toes): None, normal, Trunk Movements Neck, shoulders, hips: None, normal, Overall Severity Severity of abnormal movements (highest score from questions above): None, normal Incapacitation due to abnormal movements: None, normal Patient's awareness of abnormal movements (rate only patient's report): No Awareness, Dental Status Current problems with teeth and/or dentures?: No Does patient usually wear dentures?: No  CIWA:  CIWA-Ar Total: 2 COWS:  COWS Total Score: 2  Musculoskeletal: Strength & Muscle Tone: within normal limits no significant tremors or diaphoresis Gait & Station: normal Patient leans: N/A  Psychiatric Specialty Exam: Physical Exam  ROS no headache, no chest pain at this time, describes R side chest pain earlier today, now resolved,  feels it was  likely muscular, no shortness of breath, no vomiting , no fever , no chills   Blood pressure (!) 129/96, pulse 85, temperature 98.6 F (37 C), resp. rate 12, height 6' 3" (1.905 m), weight 97.5 kg, SpO2 99 %.Body mass index is 26.87 kg/m.  General Appearance: Fairly Groomed  Eye Contact:  Good  Speech:  Normal Rate  Volume:  Decreased  Mood:  Depressed  Affect:  remains constricted, sad  Thought Process:  Linear and Descriptions of Associations: Intact  Orientation:  Other:  fully alert and attentive  Thought Content:  no hallucinations, no delusions, not internally preoccupied   Suicidal Thoughts:  Yes.  without intent/plan denies suicidal plan or intention, contracts for safety on unit    Homicidal Thoughts:  No  Memory:  recent and remote grossly intact   Judgement:  Other:  improving   Insight:  fair- improving   Psychomotor Activity:  Normal no psychomotor restlessness or agitation, minimal distal tremors, no diaphoresis   Concentration:  Concentration: Good and Attention Span: Good  Recall:  Good  Fund of Knowledge:  Good  Language:  Good  Akathisia:  Negative  Handed:  Right  AIMS (if indicated):     Assets:  Communication Skills Desire for Improvement Resilience  ADL's:  Intact  Cognition:  WNL  Sleep:  Number of Hours: 6.5   Assessment -  58 year old male, currently homeless, unemployed.  Presented to ED for chest pain, for which she was medically cleared.  Reported also worsening depression, with recent suicidal ideations of walking into traffic or jumping from a bridge.  Endorses neurovegetative symptoms of depression.  History of cocaine/alcohol abuse. Reports he relapsed recently after a period of several weeks of sobriety.  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   Patient remains depressed, with constricted affect, and has reported passive SI, but denies suicidal plan or intention and contracts for safety on unit. No significant withdrawal symptoms noted at this time, appears comfortable and in no acute distress . Thus far tolerating Zoloft trial well .  Treatment Plan Summary: Daily contact with patient to assess and evaluate symptoms and progress in treatment, Medication management, Plan inpatient treatment and medications as below Encourage group and milieu participation to work on coping skills and symptom reduction Encourage efforts to work on sobriety and relapse prevention Treatment team working on disposition planning Continue Zoloft 50 mgrs QDAY for depression Continue Librium detox protocol to address alcohol WDL as needed  Continue Lisinopril 10 mgrs QDAY for HTN    Jenne Campus, MD 11/08/2018, 10:49 AM

## 2018-11-08 NOTE — Progress Notes (Signed)
   11/08/18 0801  COVID-19 Daily Checkoff  Have you had a fever (temp > 37.80C/100F)  in the past 24 hours?  No  If you have had runny nose, nasal congestion, sneezing in the past 24 hours, has it worsened? No  COVID-19 EXPOSURE  Have you traveled outside the state in the past 14 days? No  Have you been in contact with someone with a confirmed diagnosis of COVID-19 or PUI in the past 14 days without wearing appropriate PPE? No  Have you been living in the same home as a person with confirmed diagnosis of COVID-19 or a PUI (household contact)? No  Have you been diagnosed with COVID-19? No

## 2018-11-08 NOTE — Progress Notes (Signed)
   11/08/18 0004  COVID-19 Daily Checkoff  Have you had a fever (temp > 37.80C/100F)  in the past 24 hours?  No  COVID-19 EXPOSURE  Have you traveled outside the state in the past 14 days? No  Have you been in contact with someone with a confirmed diagnosis of COVID-19 or PUI in the past 14 days without wearing appropriate PPE? No  Have you been living in the same home as a person with confirmed diagnosis of COVID-19 or a PUI (household contact)? No  Have you been diagnosed with COVID-19? No

## 2018-11-08 NOTE — Progress Notes (Signed)
D    Pt is pleasant on approach and cooperative   He endorses depression and anxiety and pain   He requested medication early so he could go to bed    He contracts for safety  A   Verbal support given   Medications administered and effectiveness monitored   Q 15 min checks R   Pt remains safe at this time  [place] CORONAVIRUS (COVID-19) DAILY CHECK-OFF SYMPTOMS - answer yes or no to each - every day NO YES  Have you had a fever in the past 24 hours?  . Fever (Temp > 37.80C / 100F) X   Have you had any of these symptoms in the past 24 hours? . New Cough .  Sore Throat  .  Shortness of Breath .  Difficulty Breathing .  Unexplained Body Aches   X   Have you had any one of these symptoms in the past 24 hours not related to allergies?   . Runny Nose .  Nasal Congestion .  Sneezing   X   If you have had runny nose, nasal congestion, sneezing in the past 24 hours, has it worsened?  X   EXPOSURES - check yes or no X   Have you traveled outside the state in the past 14 days?  X   Have you been in contact with someone with a confirmed diagnosis of COVID-19 or PUI in the past 14 days without wearing appropriate PPE?  X   Have you been living in the same home as a person with confirmed diagnosis of COVID-19 or a PUI (household contact)?    X   Have you been diagnosed with COVID-19?    X              What to do next: Answered NO to all: Answered YES to anything:   Proceed with unit schedule Follow the [facility] Inpatient Flowsheet.

## 2018-11-09 MED ORDER — PROPRANOLOL HCL 20 MG PO TABS
40.0000 mg | ORAL_TABLET | Freq: Two times a day (BID) | ORAL | Status: DC
  Administered 2018-11-09 – 2018-11-11 (×5): 40 mg via ORAL
  Filled 2018-11-09: qty 1
  Filled 2018-11-09 (×2): qty 56
  Filled 2018-11-09 (×5): qty 1

## 2018-11-09 MED ORDER — PRIMIDONE 50 MG PO TABS
50.0000 mg | ORAL_TABLET | Freq: Two times a day (BID) | ORAL | Status: AC
  Administered 2018-11-09 – 2018-11-10 (×4): 50 mg via ORAL
  Filled 2018-11-09 (×5): qty 1

## 2018-11-09 MED ORDER — SERTRALINE HCL 100 MG PO TABS
100.0000 mg | ORAL_TABLET | Freq: Every day | ORAL | Status: DC
  Administered 2018-11-10 – 2018-11-11 (×2): 100 mg via ORAL
  Filled 2018-11-09 (×2): qty 1
  Filled 2018-11-09: qty 14
  Filled 2018-11-09: qty 1

## 2018-11-09 MED ORDER — GABAPENTIN 300 MG PO CAPS
300.0000 mg | ORAL_CAPSULE | Freq: Three times a day (TID) | ORAL | Status: DC
  Administered 2018-11-09 – 2018-11-11 (×8): 300 mg via ORAL
  Filled 2018-11-09 (×4): qty 1
  Filled 2018-11-09: qty 42
  Filled 2018-11-09 (×4): qty 1
  Filled 2018-11-09 (×2): qty 42
  Filled 2018-11-09: qty 1

## 2018-11-09 NOTE — Progress Notes (Signed)
Adult Psychoeducational Group Note  Date:  11/09/2018 Time:  9:03 PM  Group Topic/Focus:  Wrap-Up Group:   The focus of this group is to help patients review their daily goal of treatment and discuss progress on daily workbooks.  Participation Level:  Active  Participation Quality:  Appropriate  Affect:  Appropriate  Cognitive:  Appropriate  Insight: Appropriate  Engagement in Group:  Engaged  Modes of Intervention:  Discussion  Additional Comments:  Patient attended group and said that his day was a 6. His coping skills for today were attending groups, socializing and playing cards.   Damiya Sandefur W Toyna Erisman [id number], 9:03 PM

## 2018-11-09 NOTE — Progress Notes (Signed)
D   Pt  Reports feeling much better today than when he first got here   He has been visible on the milieu and his behavior is appropriate A   Verbal support given   Medications administered and effectiveness monitored   Q 15 min checks R   Pt remains safe at this time  [place] NOVEL CORONAVIRUS (COVID-19) DAILY CHECK-OFF SYMPTOMS - answer yes or no to each - every day NO YES  Have you had a fever in the past 24 hours?  . Fever (Temp > 37.80C / 100F) X   Have you had any of these symptoms in the past 24 hours? . New Cough .  Sore Throat  .  Shortness of Breath .  Difficulty Breathing .  Unexplained Body Aches   X   Have you had any one of these symptoms in the past 24 hours not related to allergies?   . Runny Nose .  Nasal Congestion .  Sneezing   X   If you have had runny nose, nasal congestion, sneezing in the past 24 hours, has it worsened?  X   EXPOSURES - check yes or no X   Have you traveled outside the state in the past 14 days?  X   Have you been in contact with someone with a confirmed diagnosis of COVID-19 or PUI in the past 14 days without wearing appropriate PPE?  X   Have you been living in the same home as a person with confirmed diagnosis of COVID-19 or a PUI (household contact)?    X   Have you been diagnosed with COVID-19?    X              What to do next: Answered NO to all: Answered YES to anything:   Proceed with unit schedule Follow the [facility] Inpatient Flowsheet.

## 2018-11-09 NOTE — [facility] Group Notes (Signed)
Adult Psychoeducational Nursing Group Note  Date:  11/09/2018 Time:  4:00 PM  Group Topic/Focus: Emotional First Aid Emotional Education:   The focus of this group is to discuss what feelings/emotions are, and how they are experienced, and how to cope.  Participation Level:  Active  Participation Quality:  Appropriate  Affect:  Appropriate  Cognitive:  Alert and Oriented  Insight: Improving  Engagement in Group:  Developing/Improving  Modes of Intervention:  Activity, Discussion, Education, Socialization and Support  Additional Comments:  Patient attended group, was engaged and contributed positively to the discussion.  Estill Bamberg A Dillan Candela 11/09/2018, 5:00 PM

## 2018-11-09 NOTE — Progress Notes (Signed)
[facility] MD Progress Note  11/09/2018 12:11 PM Alvin [facility]  MRN:  [id number] Subjective:    Patient showing good insight and cognitive therapy discussing how he is disappointed his family though he is not suicidal states every time he seems to do better and get into recovery he is drawn back to alcohol.  Has a little bit of tremors his blood pressure resolved denies wanting to harm himself now.  Discussed with insight his concern about these cravings were this desire to get back to an addicting compound even though he knows its destructive Principal Problem: alcoholism cocaine abuse chronic depression and anxiety Diagnosis: Active Problems:   MDD (major depressive disorder), recurrent episode, severe (HCC)  Total Time spent with patient: 20 minutes  Past Medical History:  Past Medical History:  Diagnosis Date  . Alcohol withdrawal seizure ([place])   . Hypertension    History reviewed. No pertinent surgical history. Family History: History reviewed. No pertinent family history.  Social History   Substance and Sexual Activity  Alcohol Use Yes   Comment: Patient is not sure how much he has been drinking     Social History   Substance and Sexual Activity  Drug Use Yes  . Types: Cocaine   Comment: last used crack cocaine last night    Social History   Socioeconomic History  . Marital status: Single    Spouse name: Not on file  . Number of children: Not on file  . Years of education: Not on file  . Highest education level: Not on file  Occupational History  . Not on file  Social Needs  . Financial resource strain: Not on file  . Food insecurity:    Worry: Not on file    Inability: Not on file  . Transportation needs:    Medical: Not on file    Non-medical: Not on file  Tobacco Use  . Smoking status: Current Every Day Smoker    Packs/day: 0.50    Types: Cigarettes  . Smokeless tobacco: Never Used  Substance and Sexual Activity  . Alcohol use: Yes    Comment: Patient is not  sure how much he has been drinking  . Drug use: Yes    Types: Cocaine    Comment: last used crack cocaine last night  . Sexual activity: Yes    Birth control/protection: Condom  Lifestyle  . Physical activity:    Days per week: Not on file    Minutes per session: Not on file  . Stress: Not on file  Relationships  . Social connections:    Talks on phone: Not on file    Gets together: Not on file    Attends religious service: Not on file    Active member of club or organization: Not on file    Attends meetings of clubs or organizations: Not on file    Relationship status: Not on file  Other Topics Concern  . Not on file  Social History Narrative  . Not on file   Additional Social History:                         Sleep: Fair  Appetite:  Fair  Current Medications: Current Facility-Administered Medications  Medication Dose Route Frequency Provider Last Rate Last Dose  . acetaminophen (TYLENOL) tablet 650 mg  650 mg Oral Q6H PRN Money, Lowry Ram, FNP   650 mg at 11/08/18 2026  . alum & mag hydroxide-simeth (MAALOX/MYLANTA) 200-200-20 MG/5ML  suspension 30 mL  30 mL Oral Q4H PRN Money, Lowry Ram, FNP      . chlordiazePOXIDE (LIBRIUM) capsule 25 mg  25 mg Oral Q6H PRN Cobos, Myer Peer, MD   25 mg at 11/09/18 1119  . gabapentin (NEURONTIN) capsule 300 mg  300 mg Oral TID Johnn Hai, MD      . hydrOXYzine (ATARAX/VISTARIL) tablet 25 mg  25 mg Oral Q6H PRN Cobos, Myer Peer, MD   25 mg at 11/08/18 2026  . lisinopril (ZESTRIL) tablet 10 mg  10 mg Oral Daily Cobos, Myer Peer, MD   10 mg at 11/09/18 1117  . loperamide (IMODIUM) capsule 2-4 mg  2-4 mg Oral PRN Cobos, Myer Peer, MD      . magnesium hydroxide (MILK OF MAGNESIA) suspension 30 mL  30 mL Oral Daily PRN Money, Darnelle Maffucci B, FNP      . multivitamin with minerals tablet 1 tablet  1 tablet Oral Daily Cobos, Myer Peer, MD   1 tablet at 11/09/18 1118  . nicotine (NICODERM CQ - dosed in mg/24 hours) patch 14 mg  14 mg  Transdermal Daily Cobos, Myer Peer, MD   14 mg at 11/09/18 1118  . ondansetron (ZOFRAN-ODT) disintegrating tablet 4 mg  4 mg Oral Q6H PRN Cobos, Myer Peer, MD      . pneumococcal 23 valent vaccine (PNU-IMMUNE) injection 0.5 mL  0.5 mL Intramuscular Tomorrow-1000 Cobos, Myer Peer, MD      . primidone (MYSOLINE) tablet 50 mg  50 mg Oral BID Johnn Hai, MD      . propranolol (INDERAL) tablet 40 mg  40 mg Oral BID Johnn Hai, MD      . Derrill Memo ON 11/10/2018] sertraline (ZOLOFT) tablet 100 mg  100 mg Oral Daily Johnn Hai, MD      . thiamine (VITAMIN B-1) tablet 100 mg  100 mg Oral Daily Cobos, Myer Peer, MD   100 mg at 11/09/18 1118  . traZODone (DESYREL) tablet 50 mg  50 mg Oral QHS PRN Money, Lowry Ram, FNP   50 mg at 11/08/18 2026    Lab Results:  Results for orders placed or performed during the hospital encounter of 11/07/18 (from the past 48 hour(s))  TSH     Status: None   Collection Time: 11/08/18  5:57 AM  Result Value Ref Range   TSH 0.754 0.350 - 4.500 uIU/mL    Comment: Performed by a 3rd Generation assay with a functional sensitivity of <=0.01 uIU/mL. Performed at [facility], [address] [place], [place] [address]     Blood Alcohol level:  Lab Results  Component Value Date   ETH 70 (H) 81/19/1478    Metabolic Disorder Labs: No results found for: HGBA1C, MPG No results found for: PROLACTIN No results found for: CHOL, TRIG, HDL, CHOLHDL, VLDL, LDLCALC  Physical Findings: AIMS: Facial and Oral Movements Muscles of Facial Expression: None, normal Lips and Perioral Area: None, normal Jaw: None, normal Tongue: None, normal,Extremity Movements Upper (arms, wrists, hands, fingers): None, normal Lower (legs, knees, ankles, toes): None, normal, Trunk Movements Neck, shoulders, hips: None, normal, Overall Severity Severity of abnormal movements (highest score from questions above): None, normal Incapacitation due to abnormal movements: None,  normal Patient's awareness of abnormal movements (rate only patient's report): No Awareness, Dental Status Current problems with teeth and/or dentures?: No Does patient usually wear dentures?: No  CIWA:  CIWA-Ar Total: 4 COWS:  COWS Total Score: 2  Musculoskeletal: Strength & Muscle Tone:  within normal limits Gait & Station: normal Patient leans: N/A  Psychiatric Specialty Exam: Physical Exam  ROS  Blood pressure (!) 135/105, pulse 83, temperature 98.6 F (37 C), resp. rate 20, height 6\' 3"  (1.905 m), weight 97.5 kg, SpO2 99 %.Body mass index is 26.87 kg/m.  General Appearance: Casual  Eye Contact:  Good  Speech:  Clear and Coherent  Volume:  Decreased  Mood:  Anxious and Depressed  Affect:  Appropriate  Thought Process:  Coherent  Orientation:  Full (Time, Place, and Person)  Thought Content:  Rumination  Suicidal Thoughts:  No  Homicidal Thoughts:  No  Memory:  Immediate;   Fair  Judgement:  Intact  Insight:  Good  Psychomotor Activity:  Normal  Concentration:  Concentration: Good  Recall:  3/3  Fund of Knowledge:  Fair  Language:  Good  Akathisia:  Negative  Handed:  Right  AIMS (if indicated):     Assets:  Physical Health Resilience  ADL's:  Intact  Cognition:  WNL  Sleep:  Number of Hours: 6.75     Treatment Plan Summary: Daily contact with patient to assess and evaluate symptoms and progress in treatment, Medication management and Plan Continue but escalate sertraline add Toprol continue detox measures on appearing basis but add Mysoline short-term add gabapentin continue cognitive therapy  Kayna Suppa, MD 11/09/2018, 12:11 PM

## 2018-11-09 NOTE — Progress Notes (Addendum)
Pt attended spiritual care group on grief and loss facilitated by chaplain Jerene Pitch   Group opened with brief discussion and psycho-social ed around grief and loss in relationships and in relation to self - identifying life patterns, circumstances, changes that cause losses. Established group norm of speaking from own life experience. Group goal of establishing open and affirming space for members to share loss and experience with grief, normalize grief experience and provide psycho social education and grief support.  PT NOTE Alvin [facility] was present throughout group.  He was attentive to group discussion as evidenced by voiced affirmation to other group members.

## 2018-11-09 NOTE — Tx Team (Signed)
Interdisciplinary Treatment and Diagnostic Plan Update  11/09/2018 Time of Session: 9:00am Alvin Warren MRN: [id number]  Principal Diagnosis: <principal problem not specified>  Secondary Diagnoses: Active Problems:   MDD (major depressive disorder), recurrent episode, severe (HCC)   Current Medications:  Current Facility-Administered Medications  Medication Dose Route Frequency Provider Last Rate Last Dose  . acetaminophen (TYLENOL) tablet 650 mg  650 mg Oral Q6H PRN Money, Lowry Ram, FNP   650 mg at 11/08/18 2026  . alum & mag hydroxide-simeth (MAALOX/MYLANTA) 200-200-20 MG/5ML suspension 30 mL  30 mL Oral Q4H PRN Money, Lowry Ram, FNP      . chlordiazePOXIDE (LIBRIUM) capsule 25 mg  25 mg Oral Q6H PRN Cobos, Myer Peer, MD   25 mg at 11/08/18 2026  . hydrOXYzine (ATARAX/VISTARIL) tablet 25 mg  25 mg Oral Q6H PRN Cobos, Myer Peer, MD   25 mg at 11/08/18 2026  . lisinopril (ZESTRIL) tablet 10 mg  10 mg Oral Daily Cobos, Myer Peer, MD   10 mg at 11/08/18 0805  . loperamide (IMODIUM) capsule 2-4 mg  2-4 mg Oral PRN Cobos, Myer Peer, MD      . magnesium hydroxide (MILK OF MAGNESIA) suspension 30 mL  30 mL Oral Daily PRN Money, Darnelle Maffucci B, FNP      . multivitamin with minerals tablet 1 tablet  1 tablet Oral Daily Cobos, Myer Peer, MD   1 tablet at 11/08/18 0805  . nicotine (NICODERM CQ - dosed in mg/24 hours) patch 14 mg  14 mg Transdermal Daily Cobos, Myer Peer, MD   14 mg at 11/08/18 0805  . ondansetron (ZOFRAN-ODT) disintegrating tablet 4 mg  4 mg Oral Q6H PRN Cobos, Myer Peer, MD      . pneumococcal 23 valent vaccine (PNU-IMMUNE) injection 0.5 mL  0.5 mL Intramuscular Tomorrow-1000 Cobos, Alvin A, MD      . sertraline (ZOLOFT) tablet 50 mg  50 mg Oral Daily Cobos, Myer Peer, MD   50 mg at 11/08/18 0805  . thiamine (VITAMIN B-1) tablet 100 mg  100 mg Oral Daily Cobos, Myer Peer, MD   100 mg at 11/08/18 0805  . traZODone (DESYREL) tablet 50 mg  50 mg Oral QHS PRN Money, Lowry Ram, FNP    50 mg at 11/08/18 2026   PTA Medications: Medications Prior to Admission  Medication Sig Dispense Refill Last Dose  . chlordiazePOXIDE (LIBRIUM) 25 MG capsule take 2 caps 3 times daily on day 1, then 1-2 caps 2 times daily on day 2, then 1-2 caps daily on day 3     . hydrochlorothiazide (HYDRODIURIL) 25 MG tablet Take 25 mg by mouth daily.     Alvin [facility] lisinopril (ZESTRIL) 10 MG tablet Take 40 mg by mouth daily.       Patient Stressors: Financial difficulties Substance abuse  Patient Strengths: Capable of independent living Communication skills  Treatment Modalities: Medication Management, Group therapy, Case management,  1 to 1 session with clinician, Psychoeducation, Recreational therapy.   Physician Treatment Plan for Primary Diagnosis: <principal problem not specified> Long Term Goal(s): Improvement in symptoms so as ready for discharge Improvement in symptoms so as ready for discharge   Short Term Goals: Ability to identify changes in lifestyle to reduce recurrence of condition will improve Ability to verbalize feelings will improve Ability to disclose and discuss suicidal ideas Ability to demonstrate self-control will improve Ability to identify and develop effective coping behaviors will improve Ability to maintain clinical measurements within normal limits will improve Ability to  identify changes in lifestyle to reduce recurrence of condition will improve Ability to identify triggers associated with substance abuse/mental health issues will improve  Medication Management: Evaluate patient's response, side effects, and tolerance of medication regimen.  Therapeutic Interventions: 1 to 1 sessions, Unit Group sessions and Medication administration.  Evaluation of Outcomes: Progressing  Physician Treatment Plan for Secondary Diagnosis: Active Problems:   MDD (major depressive disorder), recurrent episode, severe ([place])  Long Term Goal(s): Improvement in symptoms so as ready for  discharge Improvement in symptoms so as ready for discharge   Short Term Goals: Ability to identify changes in lifestyle to reduce recurrence of condition will improve Ability to verbalize feelings will improve Ability to disclose and discuss suicidal ideas Ability to demonstrate self-control will improve Ability to identify and develop effective coping behaviors will improve Ability to maintain clinical measurements within normal limits will improve Ability to identify changes in lifestyle to reduce recurrence of condition will improve Ability to identify triggers associated with substance abuse/mental health issues will improve     Medication Management: Evaluate patient's response, side effects, and tolerance of medication regimen.  Therapeutic Interventions: 1 to 1 sessions, Unit Group sessions and Medication administration.  Evaluation of Outcomes: Progressing   RN Treatment Plan for Primary Diagnosis: <principal problem not specified> Long Term Goal(s): Knowledge of disease and therapeutic regimen to maintain health will improve  Short Term Goals: Ability to participate in decision making will improve, Ability to disclose and discuss suicidal ideas and Ability to identify and develop effective coping behaviors will improve  Medication Management: RN will administer medications as ordered by provider, will assess and evaluate patient's response and provide education to patient for prescribed medication. RN will report any adverse and/or side effects to prescribing provider.  Therapeutic Interventions: 1 on 1 counseling sessions, Psychoeducation, Medication administration, Evaluate responses to treatment, Monitor vital signs and CBGs as ordered, Perform/monitor CIWA, COWS, AIMS and Fall Risk screenings as ordered, Perform wound care treatments as ordered.  Evaluation of Outcomes: Progressing   LCSW Treatment Plan for Primary Diagnosis: <principal problem not specified> Long Term  Goal(s): Safe transition to appropriate next level of care at discharge, Engage patient in therapeutic group addressing interpersonal concerns.  Short Term Goals: Engage patient in aftercare planning with referrals and resources, Increase social support, Identify triggers associated with mental health/substance abuse issues and Increase skills for wellness and recovery  Therapeutic Interventions: Assess for all discharge needs, 1 to 1 time with Social worker, Explore available resources and support systems, Assess for adequacy in community support network, Educate family and significant other(s) on suicide prevention, Complete Psychosocial Assessment, Interpersonal group therapy.  Evaluation of Outcomes: Progressing   Progress in Treatment: Attending groups: Yes. Participating in groups: Yes. Taking medication as prescribed: Yes. Toleration medication: Yes. Family/Significant other contact made: No, will contact:  patient declined consents, SPE was reviewed with patient. Patient understands diagnosis: Yes. Discussing patient identified problems/goals with staff: Yes. Medical problems stabilized or resolved: Yes. Denies suicidal/homicidal ideation: No. Issues/concerns per patient self-inventory: Yes.  New problem(s) identified: Yes, Describe:  homeless, limited social supports  New Short Term/Long Term Goal(s): detox, medication management for mood stabilization; elimination of SI thoughts; development of comprehensive mental wellness/sobriety plan.  Patient Goals: Patient is interested in [facility] residential [facility].  Discharge Plan or Barriers: CSW assessing for appropriate referrals. It is unlikely patient will be able to enter [facility] residential treatment program due to [place] [facility].  Reason for Continuation of Hospitalization: Anxiety Depression Suicidal  ideation  Estimated Length of Stay: 1-3 days  Attendees: Patient: Alvin [facility] 11/09/2018 10:51 AM  Physician:  11/09/2018 10:51  AM  Nursing:  11/09/2018 10:51 AM  RN Care Manager: 11/09/2018 10:51 AM  Social Worker: Stephanie Acre, [place] 11/09/2018 10:51 AM  Recreational Therapist:  11/09/2018 10:51 AM  Other:  11/09/2018 10:51 AM  Other:  11/09/2018 10:51 AM  Other: 11/09/2018 10:51 AM    Scribe for Treatment Team: Joellen [place], [place] 11/09/2018 10:51 AM

## 2018-11-09 NOTE — Plan of Care (Signed)
Nursing Progress Note [id number]  On initial approach, patient remains resting in bed. Patient presents minimal and depressed. Patient requires encouragement to get out of his room and take medications. Patient currently denies SI/HI/AVH. Patient reports he is having withdrawal symptoms including tremors, sweating and anxiety.  Patient is educated about and provided medication per provider's orders. Patient safety maintained with q15 min safety checks and high fall risk precautions. Emotional support given, 1:1 interaction, and active listening provided. Patient encouraged to attend meals, groups, and work on treatment plan and goals. Labs, vital signs and patient behavior monitored throughout shift.   Patient contracts for safety with staff. Patient remains safe on the unit at this time and agrees to come to staff with any issues/concerns. Patient is interacting with peers appropriately on the unit. Will continue to support and monitor.   Patient's self-inventory sheet Rated Energy Level  Low  Rated Sleep  Fair  Rated Appetite  Fair  Rated Anxiety (0-10)  9  Rated Hopelessness (0-10)  7  Rated Depression (0-10)  8  Daily Goal  "get help, pray, follow staff"  Any Additional Comments:      Problem: Education: Goal: Ability to make informed decisions regarding treatment will improve Outcome: Progressing   Problem: Medication: Goal: Compliance with prescribed medication regimen will improve Outcome: Progressing   Problem: Physical Regulation: Goal: Ability to maintain clinical measurements within normal limits will improve Outcome: Progressing   Problem: Safety: Goal: Periods of time without injury will increase Outcome: Progressing   [place] NOVEL CORONAVIRUS (COVID-19) DAILY CHECK-OFF SYMPTOMS - answer yes or no to each - every day NO YES  Have you had a fever in the past 24 hours?  Fever (Temp > 37.80C / 100F) X   Have you had any of these symptoms in the past 24  hours? New Cough  Sore Throat   Shortness of Breath  Difficulty Breathing  Unexplained Body Aches   X   Have you had any one of these symptoms in the past 24 hours not related to allergies?   Runny Nose  Nasal Congestion  Sneezing   X   If you have had runny nose, nasal congestion, sneezing in the past 24 hours, has it worsened?  X   EXPOSURES - check yes or no X   Have you traveled outside the state in the past 14 days?  X   Have you been in contact with someone with a confirmed diagnosis of COVID-19 or PUI in the past 14 days without wearing appropriate PPE?  X   Have you been living in the same home as a person with confirmed diagnosis of COVID-19 or a PUI (household contact)?    X   Have you been diagnosed with COVID-19?    X              What to do next: Answered NO to all: Answered YES to anything:   Proceed with unit schedule Follow the [facility] Inpatient Flowsheet.

## 2018-11-09 NOTE — Progress Notes (Signed)
Recreation Therapy Notes  Date:  4.20.20 Time: 0930 Location: [address] Dayroom  Group Topic: Stress Management  Goal Area(s) Addresses:  Patient will identify positive stress management techniques. Patient will identify benefits of using stress management post d/c.  Intervention: Stress Management  Activity :  Meditation.  LRT introduced the stress management technique of meditation.  LRT played a meditation that focused on making the most of the day and the possibilities that are available.  Patients were to listen and follow along as meditation was played to engage in activity.  Education: Stress Management, Discharge Planning.   Education Outcome: Acknowledges Education  Clinical Observations/Feedback: Pt did not attend group.     Victorino Sparrow, LRT/CTRS         Victorino Sparrow A 11/09/2018 11:14 AM

## 2018-11-09 NOTE — Progress Notes (Signed)
CSW met with patient on unit to discuss referrals and aftercare. Patient reports he has an [facility] caseworker who was working to get the patient into transitional housing and patient had an appointment with [facility] outpatient. Patient hopes these are still in place, if so, he wishes to re-enter transitional housing and follow up with [facility] outpatient.  If patient is not able to enter transitional housing, he is interested in residential substance use treatment. CSW will refer patient to [place], [place] Worker

## 2018-11-10 NOTE — Progress Notes (Signed)
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 Alcohol withdrawal seizure ([place])   . Hypertension    History reviewed. No pertinent surgical history. Family History: History reviewed. No pertinent family history.  Social History:  Social History   Substance and Sexual Activity  Alcohol Use Yes   Comment: Patient is not sure how much he has been drinking     Social History   Substance and Sexual Activity  Drug Use Yes  . Types: Cocaine   Comment: last used crack cocaine last night    Social History   Socioeconomic History  . Marital status: Single    Spouse name: Not on file  . Number of children: Not on file  . Years of education: Not on file  . Highest education level: Not on file  Occupational History  . Not on file  Social Needs  . Financial resource strain: Not on file  . Food insecurity:    Worry: Not on file    Inability: Not on file  . Transportation needs:    Medical: Not on file    Non-medical: Not on file  Tobacco Use  . Smoking status: Current Every Day Smoker    Packs/day: 0.50    Types: Cigarettes  . Smokeless tobacco: Never Used  Substance and Sexual Activity  . Alcohol use: Yes    Comment: Patient is not sure how much he has been drinking  . Drug use: Yes    Types: Cocaine     Comment: last used crack cocaine last night  . Sexual activity: Yes    Birth control/protection: Condom  Lifestyle  . Physical activity:    Days per week: Not on file    Minutes per session: Not on file  . Stress: Not on file  Relationships  . Social connections:    Talks on phone: Not on file    Gets together: Not on file    Attends religious service: Not on file    Active member of club or organization: Not on file    Attends meetings of clubs or organizations: Not on file    Relationship status: Not on file  Other Topics Concern  . Not on file  Social History Narrative  . Not on file   Additional Social History:                         Sleep: Fair  Appetite:  Fair  Current Medications: Current Facility-Administered Medications  Medication Dose Route Frequency Provider Last Rate Last Dose  . acetaminophen (TYLENOL) tablet 650 mg  650 mg Oral Q6H PRN Money, Lowry Ram, FNP   650 mg at 11/08/18 2026  . alum & mag hydroxide-simeth (MAALOX/MYLANTA) 200-200-20 MG/5ML suspension 30 mL  30 mL Oral Q4H PRN Money, Lowry Ram, FNP      .  chlordiazePOXIDE (LIBRIUM) capsule 25 mg  25 mg Oral Q6H PRN Cobos, Myer Peer, MD   25 mg at 11/09/18 2120  . gabapentin (NEURONTIN) capsule 300 mg  300 mg Oral TID Johnn Hai, MD   300 mg at 11/10/18 1205  . hydrOXYzine (ATARAX/VISTARIL) tablet 25 mg  25 mg Oral Q6H PRN Cobos, Myer Peer, MD   25 mg at 11/09/18 2120  . lisinopril (ZESTRIL) tablet 10 mg  10 mg Oral Daily Cobos, Myer Peer, MD   10 mg at 11/10/18 0820  . loperamide (IMODIUM) capsule 2-4 mg  2-4 mg Oral PRN Cobos, Myer Peer, MD      . magnesium hydroxide (MILK OF MAGNESIA) suspension 30 mL  30 mL Oral Daily PRN Money, Darnelle Maffucci B, FNP      . multivitamin with minerals tablet 1 tablet  1 tablet Oral Daily Cobos, Myer Peer, MD   1 tablet at 11/10/18 0820  . nicotine (NICODERM CQ - dosed in mg/24 hours) patch 14 mg  14 mg Transdermal Daily Cobos, Myer Peer, MD   14 mg at 11/10/18  0820  . ondansetron (ZOFRAN-ODT) disintegrating tablet 4 mg  4 mg Oral Q6H PRN Cobos, Fernando A, MD      . pneumococcal 23 valent vaccine (PNU-IMMUNE) injection 0.5 mL  0.5 mL Intramuscular Tomorrow-1000 Cobos, Fernando A, MD      . primidone (MYSOLINE) tablet 50 mg  50 mg Oral BID Johnn Hai, MD   50 mg at 11/10/18 1205  . propranolol (INDERAL) tablet 40 mg  40 mg Oral BID Johnn Hai, MD   40 mg at 11/10/18 0820  . sertraline (ZOLOFT) tablet 100 mg  100 mg Oral Daily Johnn Hai, MD   100 mg at 11/10/18 0820  . thiamine (VITAMIN B-1) tablet 100 mg  100 mg Oral Daily Cobos, Myer Peer, MD   100 mg at 11/10/18 0820  . traZODone (DESYREL) tablet 50 mg  50 mg Oral QHS PRN Money, Lowry Ram, FNP   50 mg at 11/09/18 2120    Lab Results: No results found for this or any previous visit (from the past 48 hour(s)).  Blood Alcohol level:  Lab Results  Component Value Date   ETH 70 (H) 35/32/9924    Metabolic Disorder Labs: No results found for: HGBA1C, MPG No results found for: PROLACTIN No results found for: CHOL, TRIG, HDL, CHOLHDL, VLDL, LDLCALC  Physical Findings: AIMS: Facial and Oral Movements Muscles of Facial Expression: None, normal Lips and Perioral Area: None, normal Jaw: None, normal Tongue: None, normal,Extremity Movements Upper (arms, wrists, hands, fingers): None, normal Lower (legs, knees, ankles, toes): None, normal, Trunk Movements Neck, shoulders, hips: None, normal, Overall Severity Severity of abnormal movements (highest score from questions above): None, normal Incapacitation due to abnormal movements: None, normal Patient's awareness of abnormal movements (rate only patient's report): No Awareness, Dental Status Current problems with teeth and/or dentures?: No Does patient usually wear dentures?: No  CIWA:  CIWA-Ar Total: 0 COWS:  COWS Total Score: 2  Musculoskeletal: Strength & Muscle Tone: within normal limits Gait & Station: normal Patient leans:  N/A  Psychiatric Specialty Exam: Physical Exam  ROS  Blood pressure 140/87, pulse (!) 57, temperature 97.9 F (36.6 C), temperature source Oral, resp. rate 20, height 6\' 3"  (1.905 m), weight 97.5 kg, SpO2 99 %.Body mass index is 26.87 kg/m.  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

## 2018-11-10 NOTE — Plan of Care (Addendum)
Nursing Progress Note [id number]  On initial approach, patient is seen resting in his room. Patient presents with sad/sullen affect but is pleasant and brightens during interactions. Patient compliant with scheduled medications. Patient is seen attending groups and visible in the milieu. Patient currently denies SI/HI/AVH. Patient's withdrawal symptoms appear to be improving. Patient denies concerns for writer this morning. Patient declined to complete self-inventory sheet.  Patient is educated about and provided medication per provider's orders. Patient safety maintained with q15 min safety checks and high fall risk precautions. Emotional support given, 1:1 interaction, and active listening provided. Patient encouraged to attend meals, groups, and work on treatment plan and goals. Labs, vital signs and patient behavior monitored throughout shift.   Patient contracts for safety with staff. Patient remains safe on the unit at this time and agrees to come to staff with any issues/concerns. Patient is interacting with peers appropriately on the unit. Will continue to support and monitor.   Patient's self-inventory sheet Rated Energy Level  Normal  Rated Sleep  Fair  Rated Appetite  Fair  Rated Anxiety (0-10)  6  Rated Hopelessness (0-10)  4  Rated Depression (0-10)  6  Daily Goal  "continue to work on my after plan, work with staff"  Any Additional Comments:  "Thank you, you are amazing"    Problem: Education: Goal: Knowledge of [place] Education information/materials will improve Outcome: Progressing   Problem: Activity: Goal: Interest or engagement in activities will improve Outcome: Progressing   Problem: Health Behavior/Discharge Planning: Goal: Compliance with treatment plan for underlying cause of condition will improve Outcome: Progressing   Problem: Physical Regulation: Goal: Ability to maintain clinical measurements within normal limits will improve Outcome:  Progressing   Problem: Safety: Goal: Periods of time without injury will increase Outcome: Progressing   [place] NOVEL CORONAVIRUS (COVID-19) DAILY CHECK-OFF SYMPTOMS - answer yes or no to each - every day NO YES  Have you had a fever in the past 24 hours?  . Fever (Temp > 37.80C / 100F) X   Have you had any of these symptoms in the past 24 hours? . New Cough .  Sore Throat  .  Shortness of Breath .  Difficulty Breathing .  Unexplained Body Aches   X   Have you had any one of these symptoms in the past 24 hours not related to allergies?   . Runny Nose .  Nasal Congestion .  Sneezing   X   If you have had runny nose, nasal congestion, sneezing in the past 24 hours, has it worsened?  X   EXPOSURES - check yes or no X   Have you traveled outside the state in the past 14 days?  X   Have you been in contact with someone with a confirmed diagnosis of COVID-19 or PUI in the past 14 days without wearing appropriate PPE?  X   Have you been living in the same home as a person with confirmed diagnosis of COVID-19 or a PUI (household contact)?    X   Have you been diagnosed with COVID-19?    X              What to do next: Answered NO to all: Answered YES to anything:   Proceed with unit schedule Follow the [facility] Inpatient Flowsheet.

## 2018-11-10 NOTE — Progress Notes (Signed)
Adult Psychoeducational Group Note  Date:  11/10/2018 Time:  9:41 PM  Group Topic/Focus:  Wrap-Up Group:   The focus of this group is to help patients review their daily goal of treatment and discuss progress on daily workbooks.  Participation Level:  Active  Participation Quality:  Appropriate  Affect:  Appropriate  Cognitive:  Appropriate  Insight: Appropriate  Engagement in Group:  Engaged  Modes of Intervention:  Discussion  Additional Comments:  Patient attended wrap up group and participated.   Samadhi Mahurin W Talton Delpriore 3/53/9122, 9:41 PM

## 2018-11-10 NOTE — [facility] Group Notes (Signed)
Adult Psychoeducational Group Note  Date:  11/10/2018 Time:  8:40 AM  Group Topic/Focus:  Healthy Communication:   The focus of this group is to discuss communication, barriers to communication, as well as healthy ways to communicate with others.  Pt did not attend morning group.  Alvin Warren 11/10/2018, 8:40 AM

## 2018-11-10 NOTE — Progress Notes (Signed)
Patient ID: Alvin Warren, male   DOB: [birth year], 58 y.o.   MRN: [id number]  D: Patient pleasant on approach tonight. Reports that his mood is much better than when he came in. Reports possible discharge Thursday. Interacting well with other peers. No active SI at present. A: Staff will monitor on q 15 minute checks, follow treatment plan, and give medications as ordered. R: Cooperative on the unit.

## 2018-11-11 MED ORDER — LISINOPRIL 10 MG PO TABS: 10.0000 mg | ORAL_TABLET | Freq: Every day | ORAL | 0 refills | Status: DC

## 2018-11-11 MED ORDER — LISINOPRIL 10 MG PO TABS: 10.0000 mg | ORAL_TABLET | Freq: Every day | ORAL | 2 refills | Status: DC

## 2018-11-11 MED ORDER — PROPRANOLOL HCL 40 MG PO TABS: 40.0000 mg | ORAL_TABLET | Freq: Two times a day (BID) | ORAL | 2 refills | Status: DC

## 2018-11-11 MED ORDER — TRAZODONE HCL 100 MG PO TABS: 100.0000 mg | ORAL_TABLET | Freq: Every evening | ORAL | 1 refills | Status: DC | PRN

## 2018-11-11 MED ORDER — SERTRALINE HCL 100 MG PO TABS: 100.0000 mg | ORAL_TABLET | Freq: Every day | ORAL | 1 refills | Status: DC

## 2018-11-11 MED ORDER — GABAPENTIN 300 MG PO CAPS: 300.0000 mg | ORAL_CAPSULE | Freq: Three times a day (TID) | ORAL | 2 refills | Status: DC

## 2018-11-11 MED ORDER — TRAZODONE HCL 100 MG PO TABS
100.0000 mg | ORAL_TABLET | Freq: Every evening | ORAL | Status: DC | PRN
  Filled 2018-11-11: qty 14

## 2018-11-11 NOTE — Progress Notes (Signed)
Recreation Therapy Notes  Date:  4.22.20 Time: 0930 Location: [address] Dayroom  Group Topic: Stress Management  Goal Area(s) Addresses:  Patient will identify positive stress management techniques. Patient will identify benefits of using stress management post d/c.  Intervention:  Stress Management  Activity :  Guided Imagery.  LRT introduced the stress management technique of guided imagery.  LRT read a script that allowed patients to envision their peaceful place.  Patients were to listen and follow as script was read to engage in activity.  Education:  Stress Management, Discharge Planning.   Education Outcome: Acknowledges Education  Clinical Observations/Feedback:  Pt did not attend group.     Victorino Sparrow, LRT/CTRS          Victorino Sparrow A 11/11/2018 10:36 AM

## 2018-11-11 NOTE — Progress Notes (Signed)
Patient has been in contact with his [facility] caseworker, [place]. Patient has reportedly been approved for a transitional housing program with the [facility]. He would prefer to still discharge tomorrow morning and head to the [facility] early in the day to complete his intake for the program.  Patient reports the [facility] will provide outpatient follow up, this is most likely through the [facility]. CSW to schedule a [facility] appt patient to ensure continuity of care.  Psychiatry informed.   Stephanie Acre, LCSW-A Clinical Social Worker

## 2018-11-11 NOTE — Progress Notes (Signed)
[facility] MD Progress Note  11/11/2018 9:15 AM Alvin Warren  MRN:  [id number] Subjective:   Generally improved affect still constricted mood dysphoric but improved without thoughts of self-harm without cravings tremors or withdrawal symptoms discharge tomorrow to [facility] Principal Problem: chemical dependency and depression Diagnosis: Active Problems:   MDD (major depressive disorder), recurrent episode, severe ([place])  Total Time spent with patient: 20 minutes   Past Medical History:   Past Medical History:  Diagnosis Date  . Alcohol withdrawal seizure ([place])   . Hypertension    History reviewed. No pertinent surgical history. Family History: History reviewed. No pertinent family history.  Social History:  Social History   Substance and Sexual Activity  Alcohol Use Yes   Comment: Patient is not sure how much he has been drinking     Social History   Substance and Sexual Activity  Drug Use Yes  . Types: Cocaine   Comment: last used crack cocaine last night    Social History   Socioeconomic History  . Marital status: Single    Spouse name: Not on file  . Number of children: Not on file  . Years of education: Not on file  . Highest education level: Not on file  Occupational History  . Not on file  Social Needs  . Financial resource strain: Not on file  . Food insecurity:    Worry: Not on file    Inability: Not on file  . Transportation needs:    Medical: Not on file    Non-medical: Not on file  Tobacco Use  . Smoking status: Current Every Day Smoker    Packs/day: 0.50    Types: Cigarettes  . Smokeless tobacco: Never Used  Substance and Sexual Activity  . Alcohol use: Yes    Comment: Patient is not sure how much he has been drinking  . Drug use: Yes    Types: Cocaine    Comment: last used crack cocaine last night  . Sexual activity: Yes    Birth control/protection: Condom  Lifestyle  . Physical activity:    Days per week: Not on file    Minutes per session: Not  on file  . Stress: Not on file  Relationships  . Social connections:    Talks on phone: Not on file    Gets together: Not on file    Attends religious service: Not on file    Active member of club or organization: Not on file    Attends meetings of clubs or organizations: Not on file    Relationship status: Not on file  Other Topics Concern  . Not on file  Social History Narrative  . Not on file   Additional Social History:                         Sleep: Fair  Appetite:  Fair  Current Medications: Current Facility-Administered Medications  Medication Dose Route Frequency Provider Last Rate Last Dose  . acetaminophen (TYLENOL) tablet 650 mg  650 mg Oral Q6H PRN Money, Lowry Ram, FNP   650 mg at 11/10/18 2127  . alum & mag hydroxide-simeth (MAALOX/MYLANTA) 200-200-20 MG/5ML suspension 30 mL  30 mL Oral Q4H PRN Money, Darnelle Maffucci B, FNP      . gabapentin (NEURONTIN) capsule 300 mg  300 mg Oral TID Johnn Hai, MD   300 mg at 11/11/18 1443  . lisinopril (ZESTRIL) tablet 10 mg  10 mg Oral Daily Cobos, Myer Peer, MD  10 mg at 11/11/18 2671  . magnesium hydroxide (MILK OF MAGNESIA) suspension 30 mL  30 mL Oral Daily PRN Money, Lowry Ram, FNP      . multivitamin with minerals tablet 1 tablet  1 tablet Oral Daily Cobos, Myer Peer, MD   1 tablet at 11/11/18 [phone]  . nicotine (NICODERM CQ - dosed in mg/24 hours) patch 14 mg  14 mg Transdermal Daily Cobos, Myer Peer, MD   14 mg at 11/11/18 0807  . pneumococcal 23 valent vaccine (PNU-IMMUNE) injection 0.5 mL  0.5 mL Intramuscular Tomorrow-1000 Cobos, Fernando A, MD      . propranolol (INDERAL) tablet 40 mg  40 mg Oral BID Johnn Hai, MD   40 mg at 11/11/18 0998  . sertraline (ZOLOFT) tablet 100 mg  100 mg Oral Daily Johnn Hai, MD   100 mg at 11/11/18 3382  . thiamine (VITAMIN B-1) tablet 100 mg  100 mg Oral Daily Cobos, Myer Peer, MD   100 mg at 11/11/18 0807  . traZODone (DESYREL) tablet 50 mg  50 mg Oral QHS PRN Money, Lowry Ram,  FNP   50 mg at 11/10/18 2126    Lab Results: No results found for this or any previous visit (from the past 48 hour(s)).  Blood Alcohol level:  Lab Results  Component Value Date   ETH 70 (H) 50/53/9767    Metabolic Disorder Labs: No results found for: HGBA1C, MPG No results found for: PROLACTIN No results found for: CHOL, TRIG, HDL, CHOLHDL, VLDL, LDLCALC  Physical Findings: AIMS: Facial and Oral Movements Muscles of Facial Expression: None, normal Lips and Perioral Area: None, normal Jaw: None, normal Tongue: None, normal,Extremity Movements Upper (arms, wrists, hands, fingers): None, normal Lower (legs, knees, ankles, toes): None, normal, Trunk Movements Neck, shoulders, hips: None, normal, Overall Severity Severity of abnormal movements (highest score from questions above): None, normal Incapacitation due to abnormal movements: None, normal Patient's awareness of abnormal movements (rate only patient's report): No Awareness, Dental Status Current problems with teeth and/or dentures?: No Does patient usually wear dentures?: No  CIWA:  CIWA-Ar Total: 0 COWS:  COWS Total Score: 2  Musculoskeletal: Strength & Muscle Tone: within normal limits Gait & Station: normal Patient leans: N/A  Psychiatric Specialty Exam: Physical Exam  ROS  Blood pressure 133/89, pulse 60, temperature 97.9 F (36.6 C), temperature source Oral, resp. rate 20, height 6\' 3"  (1.905 m), weight 97.5 kg, SpO2 99 %.Body mass index is 26.87 kg/m.  General Appearance: Casual  Eye Contact:  nl  Speech:  Clear and Coherent  Volume:  Decreased  Mood:  Anxious and Dysphoric  Affect:  Appropriate  Thought Process:  Goal Directed  Orientation:  Full (Time, Place, and Person)  Thought Content:  Tangential  Suicidal Thoughts:  No  Homicidal Thoughts:  No  Memory:  Immediate;   Fair  Judgement:  Fair  Insight:  Fair  Psychomotor Activity:  Normal  Concentration:  Concentration: Fair  Recall:  [employer] of Knowledge:  Fair  Language:  Good  Akathisia:  Negative  Handed:  Right  AIMS (if indicated):     Assets:  Leisure Time Physical Health Resilience Social Support Talents/Skills  ADL's:  Intact  Cognition:  WNL  Sleep:  Number of Hours: 6.5     Treatment Plan Summary: Daily contact with patient to assess and evaluate symptoms and progress in treatment, Medication management and Plan No change in meds probable discharge tomorrow morning to [facility]a Koyanagi, MD  11/11/2018, 9:15 AM

## 2018-11-11 NOTE — Progress Notes (Signed)
Patient has a [facility] residential screening tomorrow morning (04/23).   Patient will need discharge orders in after 3pm today.  Patient will need a 28 day supply of medication.  CSW will leave a taxi voucher on patient's chart, patient will need to leave at 7:00am in order to arrive at [facility] in time for his 7:45am screening.  Stephanie Acre, LCSW-A Clinical Social Worker

## 2018-11-11 NOTE — [facility] Suicide Risk Assessment (Signed)
[facility] Discharge Suicide Risk Assessment   Principal Problem: Chemical dependency's/depression Discharge Diagnoses: Active Problems:   MDD (major depressive disorder), recurrent episode, severe ([place])   Total Time spent with patient: 45 minutes  Musculoskeletal: Strength & Muscle Tone: within normal limits Gait & Station: normal Patient leans: N/A  Psychiatric Specialty Exam: ROS  Blood pressure 133/89, pulse 60, temperature 97.9 F (36.6 C), temperature source Oral, resp. rate 20, height 6\' 3"  (1.905 m), weight 97.5 kg, SpO2 99 %.Body mass index is 26.87 kg/m.  General Appearance: Casual  Eye Contact::  Good  Speech:  Normal Rate409  Volume:  Decreased  Mood:  Euthymic  Affect:  Congruent  Thought Process:  Coherent and Goal Directed  Orientation:  Full (Time, Place, and Person)  Thought Content:  Logical and Rumination  Suicidal Thoughts:  No  Homicidal Thoughts:  No  Memory:  Immediate;   Good  Judgement:  Good  Insight:  Good  Psychomotor Activity:  Normal  Concentration:  Good  Recall:  Good  Fund of Knowledge:Good  Language: Normal cadence tone and vocabulary  Akathisia:  Negative  Handed:  Right  AIMS (if indicated):     Assets:  Communication Skills Desire for Improvement Leisure Time Physical Health Resilience Talents/Skills Transportation Vocational/Educational  Sleep:  Number of Hours: 6.5  Cognition: WNL  ADL's:  Intact   Mental Status Per Nursing Assessment::   On Admission:  Suicide plan, Suicidal ideation indicated by patient, Intention to act on suicide plan  Demographic Factors:  Male and Divorced or widowed  Loss Factors: Decrease in vocational status  Historical Factors: NA  Risk Reduction Factors:   Sense of responsibility to family, Religious beliefs about death and Positive social support  Continued Clinical Symptoms:  Alcohol/Substance Abuse/Dependencies  Cognitive Features That Contribute To Risk:  None    Suicide Risk:   Minimal: No identifiable suicidal ideation.  Patients presenting with no risk factors but with morbid ruminations; may be classified as minimal risk based on the severity of the depressive symptoms  Follow-up Information    Services, [facility] Follow up on 11/12/2018.   Why:  Screening appointment for possible admission is Thursday, 4/23 at 7:45a.  Please bring your photo ID, SSN,  30-day supply of medications, and 2 weeks of clothing. Contact information: [address] [place] [address] [phone]           Plan Of Care/Follow-up recommendations:  Activity:  full  Shalese Strahan, MD 11/11/2018, 2:59 PM

## 2018-11-11 NOTE — Progress Notes (Signed)
Adult Psychoeducational Group Note  Date:  11/11/2018 Time:  10:14 PM  Group Topic/Focus:  Wrap-Up Group:   The focus of this group is to help patients review their daily goal of treatment and discuss progress on daily workbooks.  Participation Level:  Active  Participation Quality:  Appropriate  Affect:  Appropriate  Cognitive:  Appropriate  Insight: Appropriate  Engagement in Group:  Engaged  Modes of Intervention:  Discussion  Additional Comments:  Pt stated his gaol was to get in touch with an outside resource and she helped him come up with an after discharge plan.  Pt did his met his goal. Pt rated the day at a 8/10.  Cyan Moultrie 11/11/2018, 10:14 PM

## 2018-11-11 NOTE — Plan of Care (Addendum)
Patient was pleasant upon approach. Denies SI HI AVH. Denies physical pain. Patient said he is excited to go to [facility] early tomorrow morning. Safety is maintained with 15 minute checks as well as environmental checks. Will continue to monitor.   Problem: Coping: Goal: Coping ability will improve Outcome: Adequate for Discharge   Problem: Health Behavior/Discharge Planning: Goal: Identification of resources available to assist in meeting health care needs will improve Outcome: Adequate for Discharge   Problem: Medication: Goal: Compliance with prescribed medication regimen will improve Outcome: Adequate for Discharge   Problem: Self-Concept: Goal: Ability to disclose and discuss suicidal ideas will improve Outcome: Adequate for Discharge

## 2018-11-12 NOTE — Progress Notes (Signed)
Pt was discharged and received all follow up information   Pt denies SI/HI   Received prescriptions and 2 week medication supply

## 2018-11-13 NOTE — Progress Notes (Signed)
[place] Screening performed. Temperature, PHQ-9, and need for medical care and medications assessed. PHQ-9=5. Patient given a copy of the [employer] resources form. No other needs reported.  Arnold Long RN MSN

## 2018-11-16 NOTE — Discharge Summary (Signed)
Physician Discharge Summary Note  Patient:  Alvin Warren is an 58 y.o., male MRN:  [id number] DOB:  02/18/1961 Patient phone:  [phone] (home)  Patient address:   [place] [address],  Total Time spent with patient: 45 minutes  Date of Admission:  11/07/2018 Date of Discharge: 11/12/2018  Reason for Admission:   History of Present Illness:  58 year old male, currently homeless . Presented to ED voluntarily for chest pain, for which he was medically cleared and which has currently subsided  , and also reported worsening depression and suicidal ideations over recent days, with thoughts of walking in front of traffic or jumping off a bridge, and states " I was hoping I would get that virus that is going around so I could die". He has a history of alcohol and cocaine use disorders. Reports he relapsed on alcohol and cocaine about  one week ago after a period of several weeks of sobriety. 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 He states depression preceded relapse.  Admission BAL 70. Admission UDS positive for Cocaine, BZDs .  Describes neuro-vegetative symptoms as below.    Principal Problem: Homelessness depression substance abuse Discharge Diagnoses: Active Problems:   MDD (major depressive disorder), recurrent episode, severe ([place])  Past Medical History:  Past Medical History:  Diagnosis Date  . Alcohol withdrawal seizure ([place])   . Hypertension    History reviewed. No pertinent surgical history. Family History: History reviewed. No pertinent family history. Family Psychiatric  History: no new data  Social History:  Social History   Substance and Sexual Activity  Alcohol Use Yes   Comment: Patient is not sure how much he has been drinking     Social History   Substance and Sexual Activity  Drug Use Yes  . Types: Cocaine   Comment: last used crack cocaine last night     Social History   Socioeconomic History  . Marital status: Single    Spouse name: Not on file  . Number of children: Not on file  . Years of education: Not on file  . Highest education level: Not on file  Occupational History  . Not on file  Social Needs  . Financial resource strain: Not on file  . Food insecurity:    Worry: Not on file    Inability: Not on file  . Transportation needs:    Medical: Not on file    Non-medical: Not on file  Tobacco Use  . Smoking status: Current Every Day Smoker    Packs/day: 0.50    Types: Cigarettes  . Smokeless tobacco: Never Used  Substance and Sexual Activity  . Alcohol use: Yes    Comment: Patient is not sure how much he has been drinking  . Drug use: Yes    Types: Cocaine    Comment: last used crack cocaine last night  . Sexual activity: Yes    Birth control/protection: Condom  Lifestyle  . Physical activity:    Days per week: Not on file    Minutes per session: Not on file  . Stress: Not on file  Relationships  . Social connections:    Talks on phone: Not on file    Gets together: Not on file    Attends religious service: Not on file    Active member of club or organization: Not on file    Attends meetings of clubs or organizations: Not  on file    Relationship status: Not on file  Other Topics Concern  . Not on file  Social History Narrative  . Not on file    Hospital Course:   Patient detox excessively displayed no danger behaviors and was always compliant with the treatment plan by the date of the 23rd of been cleared to go to [facility] recovery services alert oriented cooperative no thoughts of harming self or others no cravings tremors or withdrawal no psychosis  Physical Findings: AIMS: Facial and Oral Movements Muscles of Facial Expression: None, normal Lips and Perioral Area: None, normal Jaw: None, normal Tongue: None, normal,Extremity Movements Upper (arms, wrists, hands, fingers): None, normal Lower (legs,  knees, ankles, toes): None, normal, Trunk Movements Neck, shoulders, hips: None, normal, Overall Severity Severity of abnormal movements (highest score from questions above): None, normal Incapacitation due to abnormal movements: None, normal Patient's awareness of abnormal movements (rate only patient's report): No Awareness, Dental Status Current problems with teeth and/or dentures?: No Does patient usually wear dentures?: No  CIWA:  CIWA-Ar Total: 1 COWS:  COWS Total Score: 2  Musculoskeletal: Strength & Muscle Tone: within normal limits Gait & Station: normal Patient leans: N/A  Psychiatric Specialty Exam: Physical Exam  ROS  Blood pressure (!) 159/102, pulse (!) 52, temperature 97.9 F (36.6 C), temperature source Oral, resp. rate 20, height 6\' 3"  (1.905 m), weight 97.5 kg, SpO2 99 %.Body mass index is 26.87 kg/m.  General Appearance: Casual  Eye Contact:  Fair  Speech:  Clear and Coherent  Volume:  Normal  Mood:  Euthymic  Affect:  Constricted  Thought Process:  Coherent and goal-directed  Orientation:  Full (Time, Place, and Person)  Thought Content:  Logical and Tangential  Suicidal Thoughts:  No  Homicidal Thoughts:  No  Memory:  Immediate;   Good  Judgement:  Good  Insight:  Good  Psychomotor Activity:  Negative  Concentration:  Concentration: Poor  Recall:  Good  Fund of Knowledge:  Fair  Language:  Good  Akathisia:  Negative  Handed:  Right  AIMS (if indicated):     Assets: Overall health compliance and insight despite homelessness  ADL's:  Intact  Cognition:  WNL  Sleep:  Number of Hours: 6.75     Have you used any form of tobacco in the last 30 days? (Cigarettes, Smokeless Tobacco, Cigars, and/or Pipes): Yes  Has this patient used any form of tobacco in the last 30 days? (Cigarettes, Smokeless Tobacco, Cigars, and/or Pipes) Yes, No  Blood Alcohol level:  Lab Results  Component Value Date   ETH 70 (H) 09/38/1829    Metabolic Disorder Labs:  No  results found for: HGBA1C, MPG No results found for: PROLACTIN No results found for: CHOL, TRIG, HDL, CHOLHDL, VLDL, LDLCALC  See Psychiatric Specialty Exam and Suicide Risk Assessment completed by Attending Physician prior to discharge.  Discharge destination:  Home  Is patient on multiple antipsychotic therapies at discharge:  No   Has Patient had three or more failed trials of antipsychotic monotherapy by history:  No  Recommended Plan for Multiple Antipsychotic Therapies: NA   Allergies as of 11/12/2018   No Known Allergies     Medication List    STOP taking these medications   chlordiazePOXIDE 25 MG capsule Commonly known as:  LIBRIUM   hydrochlorothiazide 25 MG tablet Commonly known as:  HYDRODIURIL     TAKE these medications     Indication  gabapentin 300 MG capsule Commonly known as:  NEURONTIN Take 1 capsule (300 mg total) by mouth 3 (three) times daily.  Indication:  Abuse or Misuse of Alcohol   lisinopril 10 MG tablet Commonly known as:  ZESTRIL Take 1 tablet (10 mg total) by mouth daily. What changed:  how much to take  Indication:  High Blood Pressure Disorder   propranolol 40 MG tablet Commonly known as:  INDERAL Take 1 tablet (40 mg total) by mouth 2 (two) times daily.  Indication:  High Blood Pressure Disorder   sertraline 100 MG tablet Commonly known as:  ZOLOFT Take 1 tablet (100 mg total) by mouth daily.  Indication:  Major Depressive Disorder   traZODone 100 MG tablet Commonly known as:  DESYREL Take 1 tablet (100 mg total) by mouth at bedtime as needed for sleep.  Indication:  Abuse or Misuse of Alcohol      Follow-up Information    [facility] Follow up on 11/16/2018.   Why:  Hospital follow up appointment is Monday, 4/27 at 11:00a. The appointment will be held over the phone and the provider will contact you.  Contact information: [address] [place]  [id number] [phone]           SignedJohnn Hai,  MD 11/16/2018, 2:02 PM

## 2018-11-20 ENCOUNTER — Other Ambulatory Visit: Payer: Self-pay

## 2018-11-20 ENCOUNTER — Emergency Department (HOSPITAL_COMMUNITY): Admission: EM | Admit: 2018-11-20 | Discharge: 2018-11-20 | Discharge status: Absent without leave | Payer: Self-pay

## 2018-11-20 NOTE — Progress Notes (Signed)
[place] Screening performed. Temperature, PHQ-9, and need for medical care and medications assessed. No additional needs at this time.  Arnold Long RN MSN

## 2018-11-22 ENCOUNTER — Emergency Department (HOSPITAL_COMMUNITY)
Admission: EM | Admit: 2018-11-22 | Discharge: 2018-11-22 | Disposition: A | Discharge status: Home / Self Care | Payer: Self-pay | Attending: Emergency Medicine | Admitting: Emergency Medicine

## 2018-11-22 ENCOUNTER — Encounter (HOSPITAL_COMMUNITY): Payer: Self-pay

## 2018-11-22 ENCOUNTER — Emergency Department (HOSPITAL_COMMUNITY): Payer: Self-pay

## 2018-11-22 ENCOUNTER — Other Ambulatory Visit: Payer: Self-pay

## 2018-11-22 DIAGNOSIS — I10 Essential (primary) hypertension: Secondary | ICD-10-CM | POA: Insufficient documentation

## 2018-11-22 DIAGNOSIS — S20221A Contusion of right back wall of thorax, initial encounter: Secondary | ICD-10-CM | POA: Insufficient documentation

## 2018-11-22 DIAGNOSIS — Y93E1 Activity, personal bathing and showering: Secondary | ICD-10-CM | POA: Insufficient documentation

## 2018-11-22 DIAGNOSIS — F17210 Nicotine dependence, cigarettes, uncomplicated: Secondary | ICD-10-CM | POA: Insufficient documentation

## 2018-11-22 DIAGNOSIS — Y999 Unspecified external cause status: Secondary | ICD-10-CM | POA: Insufficient documentation

## 2018-11-22 DIAGNOSIS — W01198A Fall on same level from slipping, tripping and stumbling with subsequent striking against other object, initial encounter: Secondary | ICD-10-CM | POA: Insufficient documentation

## 2018-11-22 DIAGNOSIS — Y92002 Bathroom of unspecified non-institutional (private) residence single-family (private) house as the place of occurrence of the external cause: Secondary | ICD-10-CM | POA: Insufficient documentation

## 2018-11-22 DIAGNOSIS — Z79899 Other long term (current) drug therapy: Secondary | ICD-10-CM | POA: Insufficient documentation

## 2018-11-22 MED ORDER — IBUPROFEN 600 MG PO TABS: 600.0000 mg | ORAL_TABLET | Freq: Four times a day (QID) | ORAL | 0 refills | Status: DC | PRN

## 2018-11-22 MED ORDER — CYCLOBENZAPRINE HCL 10 MG PO TABS: 10.0000 mg | ORAL_TABLET | Freq: Two times a day (BID) | ORAL | 0 refills | Status: DC | PRN

## 2018-11-22 MED ORDER — HYDROCODONE-ACETAMINOPHEN 5-325 MG PO TABS
2.0000 | ORAL_TABLET | Freq: Once | ORAL | Status: AC
  Administered 2018-11-22: 12:00:00 2 via ORAL
  Filled 2018-11-22: qty 2

## 2018-11-22 NOTE — ED Provider Notes (Signed)
[facility] DEPT Provider Note   CSN: [id number] Arrival date & time: 11/22/18  7616    History   Chief Complaint Chief Complaint  Patient presents with  . Fall  . hematoma  . Back Pain    HPI Alvin Warren is a 58 y.o. male.     The history is provided by the patient. No language interpreter was used.  Fall   Back Pain  Associated symptoms: no fever and no numbness      58 year old male with history of polysubstance abuse including cocaine abuse and alcohol abuse, history of hypertension, history of depression, prior back surgery presenting for evaluation of back injury.  Patient brought here via EMS.  Patient report 3 days ago he was showering, and was soaking his foot when he lost balance, fell backward and struck his lower back and right hip against the edge of a commode.  He did not hit his head no loss of consciousness.  He has been having persistent throbbing sharp pain to the affected area.  Pain is nonradiating worsening with standing and with walking.  Pain is not adequately controlled with ibuprofen at home.  He denies any associated numbness, no hematuria, no change in bowel bladder or abdominal pain.  No complaints of knees or ankle pain.  He report difficulty walking secondary to the pain.  Past Medical History:  Diagnosis Date  . Alcohol withdrawal seizure ([place])   . Hypertension     Patient Active Problem List   Diagnosis Date Noted  . MDD (major depressive disorder), recurrent episode, severe ([place]) 11/07/2018  . Cocaine abuse ([place]) 11/07/2018    Past Surgical History:  Procedure Laterality Date  . BACK SURGERY          Home Medications    Prior to Admission medications   Medication Sig Start Date End Date Taking? Authorizing Provider  gabapentin (NEURONTIN) 300 MG capsule Take 1 capsule (300 mg total) by mouth 3 (three) times daily. 11/11/18   Johnn Hai, MD  lisinopril (ZESTRIL) 10 MG tablet Take 1 tablet (10 mg  total) by mouth daily. 11/12/18   Rankin, Shuvon B, NP  propranolol (INDERAL) 40 MG tablet Take 1 tablet (40 mg total) by mouth 2 (two) times daily. 11/11/18   Johnn Hai, MD  sertraline (ZOLOFT) 100 MG tablet Take 1 tablet (100 mg total) by mouth daily. 11/12/18   Johnn Hai, MD  traZODone (DESYREL) 100 MG tablet Take 1 tablet (100 mg total) by mouth at bedtime as needed for sleep. 11/11/18   Johnn Hai, MD    Family History Family History  Problem Relation Age of Onset  . Heart failure Mother     Social History Social History   Tobacco Use  . Smoking status: Current Every Day Smoker    Packs/day: 0.50    Types: Cigarettes  . Smokeless tobacco: Never Used  Substance Use Topics  . Alcohol use: Yes    Comment: last used last night  . Drug use: Not Currently    Types: Cocaine    Comment: last used 2 months ago     Allergies   Patient has no known allergies.   Review of Systems Review of Systems  Constitutional: Negative for fever.  Genitourinary: Negative for flank pain.  Musculoskeletal: Positive for back pain.  Skin: Negative for wound.  Neurological: Negative for numbness.     Physical Exam Updated Vital Signs BP (!) 148/100 (BP Location: Left Arm)   Pulse 63  Temp 98.5 F (36.9 C) (Oral)   Resp 18   Ht 6\' 3"  (1.905 m)   Wt 102.1 kg   SpO2 96%   BMI 28.12 kg/m   Physical Exam Vitals signs and nursing note reviewed.  Constitutional:      General: He is not in acute distress.    Appearance: He is well-developed.  HENT:     Head: Atraumatic.  Eyes:     Conjunctiva/sclera: Conjunctivae normal.  Neck:     Musculoskeletal: Neck supple.  Abdominal:     General: Abdomen is flat.     Palpations: Abdomen is soft.     Tenderness: There is no abdominal tenderness.  Musculoskeletal:        General: Tenderness (Tenderness along lumbar spine as well as tenderness to posterior right hip on palpation without any significant overlying skin changes, no  hematoma no crepitus.  Increased right hip pain with flexion abduction and abduction.  ) present.  Skin:    Findings: No rash.     Comments: Right mid back lipoma appreciated without any significant tenderness.  Neurological:     Mental Status: He is alert.      ED Treatments / Results  Labs (all labs ordered are listed, but only abnormal results are displayed) Labs Reviewed - No data to display  EKG None  Radiology Dg Lumbar Spine Complete  Result Date: 11/22/2018 CLINICAL DATA:  [place] yesterday.  Low back pain. EXAM: LUMBAR SPINE - COMPLETE 4+ VIEW COMPARISON:  None. FINDINGS: Normal alignment of the lumbar spine. Severe disc space narrowing at L5-S1 with degenerative endplate changes. The other disc spaces are maintained. The vertebral body heights are maintained. Atherosclerotic calcifications in the abdominal aorta. Negative for a pars defect. IMPRESSION: 1. No acute abnormality in the lumbar spine. 2. Disc space narrowing and disease at L5-S1. Electronically Signed   By: Markus Daft M.D.   On: 11/22/2018 12:24   Dg Hip Unilat W Or Wo Pelvis 2-3 Views Right  Result Date: 11/22/2018 CLINICAL DATA:  Status post fall, right hip pain EXAM: DG HIP (WITH OR WITHOUT PELVIS) 2-3V RIGHT COMPARISON:  None. FINDINGS: There is no evidence of hip fracture or dislocation. Mild osteoarthritis of the right hip. No aggressive osseous lesion. IMPRESSION: No acute osseous injury of the right hip. Electronically Signed   By: Kathreen Devoid   On: 11/22/2018 12:22    Procedures Procedures (including critical care time)  Medications Ordered in ED Medications  HYDROcodone-acetaminophen (NORCO/VICODIN) 5-325 MG per tablet 2 tablet (2 tablets Oral Given 11/22/18 1224)     Initial Impression / Assessment and Plan / ED Course  I have reviewed the triage vital signs and the nursing notes.  Pertinent labs & imaging results that were available during my care of the patient were reviewed by me and considered in  my medical decision making (see chart for details).        BP (!) 158/97   Pulse (!) 52   Temp 98.5 F (36.9 C) (Oral)   Resp 18   Ht 6\' 3"  (1.905 m)   Wt 102.1 kg   SpO2 98%   BMI 28.12 kg/m    Final Clinical Impressions(s) / ED Diagnoses   Final diagnoses:  Back contusion, right, initial encounter    ED Discharge Orders         Ordered    ibuprofen (ADVIL) 600 MG tablet  Every 6 hours PRN     11/22/18 1319    cyclobenzaprine (  FLEXERIL) 10 MG tablet  2 times daily PRN     11/22/18 1319         11:56 AM Patient fell in the shower several days ago when he lost balance.  He is not complaining of pain to his lower back and right hip from the impact.  No obvious hematoma noted on exam.  He does have a lipoma to his right mid back which is nontender.  His abdomen is soft and nontender.  Will obtain x-ray of L-spine and right hip for further evaluation.  Pain medication given.  1:16 PM X-ray of right hip and lumbar spine without any acute abnormalities.  On reexamination, abdomen is soft and nontender.  Will discharge home with muscle relaxant and anti-inflammatory medication.  Return precaution discussed.   Domenic Moras, PA-C 11/22/18 [place], MD 11/22/18 1924

## 2018-11-22 NOTE — ED Notes (Signed)
Pt reports falling 2 days ago and has pain to RT lower back shooting 10/10. Pt is alert and oriented x 4 and is verbally reponsive. Pt PMS x 4 extremities.

## 2018-11-22 NOTE — ED Triage Notes (Signed)
Per EMS- patient fell in the shower 3 days ago. Patient denies LOC or hitting his head. Patient has a hematoma to the lower back . Patient c/o right lower back pain.

## 2018-11-27 NOTE — Progress Notes (Signed)
[facility] [place] Screening performed. Temperature, PHQ-9, and need for medical care and medications assessed. No additional needs assessed at this time.  Samantha Olivera RN MSN 

## 2018-12-03 NOTE — Congregational Nurse Program (Signed)
  Dept: [facility] Nurse Program Note  Date of Encounter: 10/26/2018  Past Medical History: Past Medical History:  Diagnosis Date  . Alcohol withdrawal seizure ([place])   . Hypertension     Encounter Details: Follow up encounter. Client has finished librium taper and he does not endorse any withdrawal symptoms. He states he is feeling more hopeful about the future. He has scheduled follow up appointments with [facility] and with [facility] clinic.Follow up with CN as needed. Lisette Abu RN BSN CNP [phone]

## 2018-12-06 ENCOUNTER — Observation Stay (HOSPITAL_COMMUNITY)
Admission: EM | Admit: 2018-12-06 | Discharge: 2018-12-07 | Disposition: A | Discharge status: Home / Self Care | Payer: Self-pay | Attending: Internal Medicine | Admitting: Internal Medicine

## 2018-12-06 ENCOUNTER — Encounter (HOSPITAL_COMMUNITY): Payer: Self-pay

## 2018-12-06 ENCOUNTER — Other Ambulatory Visit: Payer: Self-pay

## 2018-12-06 ENCOUNTER — Observation Stay (HOSPITAL_COMMUNITY): Payer: Self-pay

## 2018-12-06 DIAGNOSIS — N179 Acute kidney failure, unspecified: Secondary | ICD-10-CM | POA: Insufficient documentation

## 2018-12-06 DIAGNOSIS — F332 Major depressive disorder, recurrent severe without psychotic features: Secondary | ICD-10-CM | POA: Insufficient documentation

## 2018-12-06 DIAGNOSIS — F1410 Cocaine abuse, uncomplicated: Secondary | ICD-10-CM | POA: Insufficient documentation

## 2018-12-06 DIAGNOSIS — Z8249 Family history of ischemic heart disease and other diseases of the circulatory system: Secondary | ICD-10-CM | POA: Insufficient documentation

## 2018-12-06 DIAGNOSIS — R197 Diarrhea, unspecified: Secondary | ICD-10-CM

## 2018-12-06 DIAGNOSIS — F1010 Alcohol abuse, uncomplicated: Secondary | ICD-10-CM | POA: Insufficient documentation

## 2018-12-06 DIAGNOSIS — F1910 Other psychoactive substance abuse, uncomplicated: Secondary | ICD-10-CM | POA: Insufficient documentation

## 2018-12-06 DIAGNOSIS — R3911 Hesitancy of micturition: Secondary | ICD-10-CM | POA: Insufficient documentation

## 2018-12-06 DIAGNOSIS — Z79899 Other long term (current) drug therapy: Secondary | ICD-10-CM | POA: Insufficient documentation

## 2018-12-06 DIAGNOSIS — Z791 Long term (current) use of non-steroidal anti-inflammatories (NSAID): Secondary | ICD-10-CM | POA: Insufficient documentation

## 2018-12-06 DIAGNOSIS — I10 Essential (primary) hypertension: Secondary | ICD-10-CM | POA: Insufficient documentation

## 2018-12-06 DIAGNOSIS — R3915 Urgency of urination: Secondary | ICD-10-CM | POA: Insufficient documentation

## 2018-12-06 DIAGNOSIS — F17210 Nicotine dependence, cigarettes, uncomplicated: Secondary | ICD-10-CM | POA: Insufficient documentation

## 2018-12-06 DIAGNOSIS — T783XXA Angioneurotic edema, initial encounter: Principal | ICD-10-CM | POA: Insufficient documentation

## 2018-12-06 DIAGNOSIS — N401 Enlarged prostate with lower urinary tract symptoms: Secondary | ICD-10-CM | POA: Insufficient documentation

## 2018-12-06 DIAGNOSIS — Z1159 Encounter for screening for other viral diseases: Secondary | ICD-10-CM | POA: Insufficient documentation

## 2018-12-06 DIAGNOSIS — Z720 Tobacco use: Secondary | ICD-10-CM | POA: Diagnosis present

## 2018-12-06 DIAGNOSIS — X58XXXA Exposure to other specified factors, initial encounter: Secondary | ICD-10-CM | POA: Insufficient documentation

## 2018-12-06 LAB — BASIC METABOLIC PANEL
Anion gap: 14 (ref 5–15)
BUN: 20 mg/dL (ref 6–20)
CO2: 21 mmol/L — ABNORMAL LOW (ref 22–32)
Calcium: 9.3 mg/dL (ref 8.9–10.3)
Chloride: 94 mmol/L — ABNORMAL LOW (ref 98–111)
Creatinine, Ser: 2.13 mg/dL — ABNORMAL HIGH (ref 0.61–1.24)
GFR calc Af Amer: 39 mL/min — ABNORMAL LOW (ref >60)
GFR calc non Af Amer: 34 mL/min — ABNORMAL LOW (ref >60)
Glucose, Bld: 116 mg/dL — ABNORMAL HIGH (ref 70–99)
Potassium: 3.8 mmol/L (ref 3.5–5.1)
Sodium: 129 mmol/L — ABNORMAL LOW (ref 135–145)

## 2018-12-06 LAB — CBC WITH DIFFERENTIAL/PLATELET
Abs Immature Granulocytes: 0.02 10*3/uL (ref 0.00–0.07)
Basophils Absolute: 0 10*3/uL (ref 0.0–0.1)
Basophils Relative: 0 %
Eosinophils Absolute: 0.1 10*3/uL (ref 0.0–0.5)
Eosinophils Relative: 2 %
HCT: 38.9 % — ABNORMAL LOW (ref 39.0–52.0)
Hemoglobin: 13.2 g/dL (ref 13.0–17.0)
Immature Granulocytes: 0 %
Lymphocytes Relative: 33 %
Lymphs Abs: 1.7 10*3/uL (ref 0.7–4.0)
MCH: 29.7 pg (ref 26.0–34.0)
MCHC: 33.9 g/dL (ref 30.0–36.0)
MCV: 87.6 fL (ref 80.0–100.0)
Monocytes Absolute: 0.7 10*3/uL (ref 0.1–1.0)
Monocytes Relative: 14 %
Neutro Abs: 2.5 10*3/uL (ref 1.7–7.7)
Neutrophils Relative %: 51 %
Platelets: 131 10*3/uL — ABNORMAL LOW (ref 150–400)
RBC: 4.44 MIL/uL (ref 4.22–5.81)
RDW: 14.1 % (ref 11.5–15.5)
WBC: 5.1 10*3/uL (ref 4.0–10.5)
nRBC: 0 % (ref 0.0–0.2)

## 2018-12-06 LAB — SARS CORONAVIRUS 2 BY RT PCR (HOSPITAL ORDER, PERFORMED IN ~~LOC~~ HOSPITAL LAB): SARS Coronavirus 2: NEGATIVE

## 2018-12-06 MED ORDER — DIPHENHYDRAMINE HCL 50 MG/ML IJ SOLN: 25.0000 mg | Freq: Four times a day (QID) | INTRAMUSCULAR | Status: DC | PRN

## 2018-12-06 MED ORDER — SODIUM CHLORIDE 0.9% FLUSH
3.0000 mL | Freq: Two times a day (BID) | INTRAVENOUS | Status: DC
  Administered 2018-12-06: 3 mL via INTRAVENOUS

## 2018-12-06 MED ORDER — VITAMIN B-1 100 MG PO TABS
100.0000 mg | ORAL_TABLET | Freq: Every day | ORAL | Status: DC
  Administered 2018-12-06 – 2018-12-07 (×2): 100 mg via ORAL
  Filled 2018-12-06 (×2): qty 1

## 2018-12-06 MED ORDER — FAMOTIDINE 20 MG PO TABS
20.0000 mg | ORAL_TABLET | Freq: Two times a day (BID) | ORAL | Status: DC
  Administered 2018-12-06 – 2018-12-07 (×2): 20 mg via ORAL
  Filled 2018-12-06 (×2): qty 1

## 2018-12-06 MED ORDER — DIPHENHYDRAMINE HCL 50 MG/ML IJ SOLN
25.0000 mg | Freq: Once | INTRAMUSCULAR | Status: AC
  Administered 2018-12-06: 25 mg via INTRAVENOUS
  Filled 2018-12-06: qty 1

## 2018-12-06 MED ORDER — THIAMINE HCL 100 MG/ML IJ SOLN: 100.0000 mg | Freq: Every day | INTRAMUSCULAR | Status: DC

## 2018-12-06 MED ORDER — TRANEXAMIC ACID-NACL 1000-0.7 MG/100ML-% IV SOLN
1000.0000 mg | Freq: Once | INTRAVENOUS | Status: DC
  Filled 2018-12-06: qty 100

## 2018-12-06 MED ORDER — ONDANSETRON HCL 4 MG/2ML IJ SOLN: 4.0000 mg | Freq: Four times a day (QID) | INTRAMUSCULAR | Status: DC | PRN

## 2018-12-06 MED ORDER — LORAZEPAM 1 MG PO TABS: 1.0000 mg | ORAL_TABLET | Freq: Four times a day (QID) | ORAL | Status: DC | PRN

## 2018-12-06 MED ORDER — METHYLPREDNISOLONE SODIUM SUCC 125 MG IJ SOLR
125.0000 mg | Freq: Once | INTRAMUSCULAR | Status: AC
  Administered 2018-12-06: 125 mg via INTRAVENOUS
  Filled 2018-12-06: qty 2

## 2018-12-06 MED ORDER — LORAZEPAM 2 MG/ML IJ SOLN: 1.0000 mg | Freq: Four times a day (QID) | INTRAMUSCULAR | Status: DC | PRN

## 2018-12-06 MED ORDER — EPINEPHRINE 0.3 MG/0.3ML IJ SOAJ
0.3000 mg | Freq: Once | INTRAMUSCULAR | Status: AC
  Administered 2018-12-06: 0.3 mg via INTRAMUSCULAR
  Filled 2018-12-06: qty 0.3

## 2018-12-06 MED ORDER — SODIUM CHLORIDE 0.9 % IV SOLN
INTRAVENOUS | Status: DC
  Administered 2018-12-06 – 2018-12-07 (×2): via INTRAVENOUS

## 2018-12-06 MED ORDER — FAMOTIDINE IN NACL 20-0.9 MG/50ML-% IV SOLN
20.0000 mg | Freq: Once | INTRAVENOUS | Status: AC
  Administered 2018-12-06: 20 mg via INTRAVENOUS
  Filled 2018-12-06: qty 50

## 2018-12-06 MED ORDER — ALBUTEROL SULFATE (2.5 MG/3ML) 0.083% IN NEBU: 2.5000 mg | INHALATION_SOLUTION | Freq: Four times a day (QID) | RESPIRATORY_TRACT | Status: DC | PRN

## 2018-12-06 MED ORDER — ACETAMINOPHEN 325 MG PO TABS: 650.0000 mg | ORAL_TABLET | Freq: Four times a day (QID) | ORAL | Status: DC | PRN

## 2018-12-06 MED ORDER — ONDANSETRON HCL 4 MG PO TABS: 4.0000 mg | ORAL_TABLET | Freq: Four times a day (QID) | ORAL | Status: DC | PRN

## 2018-12-06 MED ORDER — NICOTINE 21 MG/24HR TD PT24
21.0000 mg | MEDICATED_PATCH | Freq: Every day | TRANSDERMAL | Status: DC
  Administered 2018-12-06 – 2018-12-07 (×2): 21 mg via TRANSDERMAL
  Filled 2018-12-06 (×2): qty 1

## 2018-12-06 MED ORDER — ACETAMINOPHEN 650 MG RE SUPP: 650.0000 mg | Freq: Four times a day (QID) | RECTAL | Status: DC | PRN

## 2018-12-06 MED ORDER — METHYLPREDNISOLONE SODIUM SUCC 125 MG IJ SOLR
60.0000 mg | Freq: Three times a day (TID) | INTRAMUSCULAR | Status: DC
  Administered 2018-12-06 – 2018-12-07 (×2): 60 mg via INTRAVENOUS
  Filled 2018-12-06 (×2): qty 2

## 2018-12-06 MED ORDER — HEPARIN SODIUM (PORCINE) 5000 UNIT/ML IJ SOLN
5000.0000 [IU] | Freq: Three times a day (TID) | INTRAMUSCULAR | Status: DC
  Administered 2018-12-06 – 2018-12-07 (×3): 5000 [IU] via SUBCUTANEOUS
  Filled 2018-12-06 (×2): qty 1

## 2018-12-06 MED ORDER — FOLIC ACID 1 MG PO TABS
1.0000 mg | ORAL_TABLET | Freq: Every day | ORAL | Status: DC
  Administered 2018-12-06 – 2018-12-07 (×2): 1 mg via ORAL
  Filled 2018-12-06 (×2): qty 1

## 2018-12-06 MED ORDER — SODIUM CHLORIDE 0.9 % IV BOLUS
1000.0000 mL | Freq: Once | INTRAVENOUS | Status: AC
  Administered 2018-12-06: 1000 mL via INTRAVENOUS

## 2018-12-06 MED ORDER — ADULT MULTIVITAMIN W/MINERALS CH
1.0000 | ORAL_TABLET | Freq: Every day | ORAL | Status: DC
  Administered 2018-12-06 – 2018-12-07 (×2): 1 via ORAL
  Filled 2018-12-06 (×2): qty 1

## 2018-12-06 NOTE — ED Notes (Addendum)
ED TO INPATIENT HANDOFF REPORT  ED Nurse Name and Phone #:  Lonn Georgia [phone]  S Name/Age/Gender [name] 58 y.o. male Room/Bed: 031C/031C  Code Status   Code Status: Prior  Home/SNF/Other Home Patient oriented to: self, place, time and situation Is this baseline? Yes   Triage Complete: Triage complete  Chief Complaint diarrhea/lip swelling  Triage Note Patient taking medications and has has no changes.  Presents with swollen lips and some diarrhea since yesterday.   No difficulty breathing.   Allergies No Known Allergies  Level of Care/Admitting Diagnosis ED Disposition    None      B Medical/Surgery History Past Medical History:  Diagnosis Date  . Alcohol withdrawal seizure ([place])   . Hypertension    Past Surgical History:  Procedure Laterality Date  . BACK SURGERY       A IV Location/Drains/Wounds Patient Lines/Drains/Airways Status   Active Line/Drains/Airways    Name:   Placement date:   Placement time:   Site:   Days:   Peripheral IV 12/06/18 Right Antecubital   12/06/18    1008    Antecubital   less than 1          Intake/Output Last 24 hours  Intake/Output Summary (Last 24 hours) at 12/06/2018 1437 Last data filed at 12/06/2018 1326 Gross per 24 hour  Intake 50 ml  Output 1500 ml  Net -1450 ml    Labs/Imaging Results for orders placed or performed during the hospital encounter of 12/06/18 (from the past 48 hour(s))  CBC with Differential     Status: Abnormal   Collection Time: 12/06/18  9:46 AM  Result Value Ref Range   WBC 5.1 4.0 - 10.5 K/uL   RBC 4.44 4.22 - 5.81 MIL/uL   Hemoglobin 13.2 13.0 - 17.0 g/dL   HCT 38.9 (L) 39.0 - 52.0 %   MCV 87.6 80.0 - 100.0 fL   MCH 29.7 26.0 - 34.0 pg   MCHC 33.9 30.0 - 36.0 g/dL   RDW 14.1 11.5 - 15.5 %   Platelets 131 (L) 150 - 400 K/uL    Comment: REPEATED TO VERIFY   nRBC 0.0 0.0 - 0.2 %   Neutrophils Relative % 51 %   Neutro Abs 2.5 1.7 - 7.7 K/uL   Lymphocytes Relative 33 %   Lymphs  Abs 1.7 0.7 - 4.0 K/uL   Monocytes Relative 14 %   Monocytes Absolute 0.7 0.1 - 1.0 K/uL   Eosinophils Relative 2 %   Eosinophils Absolute 0.1 0.0 - 0.5 K/uL   Basophils Relative 0 %   Basophils Absolute 0.0 0.0 - 0.1 K/uL   Immature Granulocytes 0 %   Abs Immature Granulocytes 0.02 0.00 - 0.07 K/uL    Comment: Performed at [facility], [address], [place], [place] [address]  Basic metabolic panel     Status: Abnormal   Collection Time: 12/06/18  9:46 AM  Result Value Ref Range   Sodium 129 (L) 135 - 145 mmol/L   Potassium 3.8 3.5 - 5.1 mmol/L   Chloride 94 (L) 98 - 111 mmol/L   CO2 21 (L) 22 - 32 mmol/L   Glucose, Bld 116 (H) 70 - 99 mg/dL   BUN 20 6 - 20 mg/dL   Creatinine, Ser 2.13 (H) 0.61 - 1.24 mg/dL   Calcium 9.3 8.9 - 10.3 mg/dL   GFR calc non Af Amer 34 (L) >60 mL/min   GFR calc Af Amer 39 (L) >60 mL/min  Anion gap 14 5 - 15    Comment: Performed at [facility] [address], [place], [place] [address]   No results found.  Pending Labs Unresulted Labs (From admission, onward)    Start     Ordered   12/06/18 1355  SARS Coronavirus 2 (CEPHEID - Performed in [place] hospital lab), Hosp Order  (Asymptomatic Patients Labs)  Once,   R    Question:  Rule Out  Answer:  Yes   12/06/18 1354          Vitals/Pain Today's Vitals   12/06/18 1300 12/06/18 1330 12/06/18 1400 12/06/18 1415  BP: 110/66 117/79 115/70 115/62  Pulse: 79 79 81 76  Resp:      Temp:      TempSrc:      SpO2: 93% 95% 97% 99%  Weight:      Height:        Isolation Precautions No active isolations  Medications Medications  diphenhydrAMINE (BENADRYL) injection 25 mg (25 mg Intravenous Given 12/06/18 1011)  famotidine (PEPCID) IVPB 20 mg premix (0 mg Intravenous Stopped 12/06/18 1108)  methylPREDNISolone sodium succinate (SOLU-MEDROL) 125 mg/2 mL injection 125 mg (125 mg Intravenous Given 12/06/18 1010)  sodium chloride 0.9 % bolus 1,000 mL (1,000 mLs Intravenous New  Bag/Given 12/06/18 1420)    Mobility walks Low fall risk   Focused Assessments    R Recommendations: See Admitting Provider Note  Report given to: Gwenlyn Perking RN  Additional Notes:

## 2018-12-06 NOTE — ED Notes (Signed)
Pt reporting increased swelling to bottom lip, no respiratory distress at this time. PA Anda Kraft aware and will assess patient as well.

## 2018-12-06 NOTE — ED Notes (Signed)
Patient reports taking lisinipril for at least 7 years.  Patient did have a type of soup last night that he had never had before, but his symptoms started yesterday morning at 11am.

## 2018-12-06 NOTE — H&P (Addendum)
History and Physical    Alvin Warren ZOX:[id number] DOB: 1960/10/01 DOA: 12/06/2018  Referring MD/NP/PA: Emeterio Reeve, PA-C PCP: Patient, No Pcp Per  Patient coming from: home   Chief Complaint: Lip swelling  I have personally briefly reviewed patient's old medical records in [place]   HPI: Alvin Warren is a 58 y.o. male with medical history significant of HTN, polysubstance use, and alcohol abuse; who presents with acute onset of lip swelling which started yesterday morning at around 11 AM.  Patient notes his upper lip started to swell and it progressively worsened throughout the day.  The night before he reports eating beef stew is not out of the norm.  He has been on lisinopril for over 7 years.  He had hurt his back on 5/3 and was seen in the emergency department given Flexeril.  He last took the medication the night before onset of symptoms.  Also reports being started on gabapentin in the last 2 months.  Other associated symptoms include complaints of nonbloody diarrhea now resolving.  Currently reports being able to swallow secretions, but notes that his tongue feels funny.  He reports drinking about a quart of alcohol every day or every other day.  ED Course: Upon admission into the emergency department patient was noted to be afebrile, respiration 20-24, blood pressure 95/59-118/60, and O2 saturation maintained on room air.  Labs revealed WBC 5.1, platelets 131, sodium 129, chloride 94, BUN 20, and creatinine 2.13.  Patient was given 125 mg of Solu-Medrol IV, Pepcid, and Benadryl.  TRH called to admit due to recurrence of lip swelling and acute kidney injury.  Review of Systems  Eyes: Negative for discharge.  Respiratory: Negative for cough, shortness of breath and stridor.   Cardiovascular: Negative for chest pain and leg swelling.  Gastrointestinal: Negative for abdominal pain and constipation.  Genitourinary: Negative for dysuria, frequency and urgency.   Musculoskeletal: Positive for back pain and falls. Negative for neck pain.  Skin:       Positive for lip swelling  Neurological: Negative for focal weakness and loss of consciousness.  Psychiatric/Behavioral: Positive for substance abuse. Negative for memory loss.    Past Medical History:  Diagnosis Date  . Alcohol withdrawal seizure ([place])   . Hypertension     Past Surgical History:  Procedure Laterality Date  . BACK SURGERY       reports that he has been smoking cigarettes. He has been smoking about 0.50 packs per day. He has never used smokeless tobacco. He reports current alcohol use. He reports previous drug use. Drug: Cocaine.  No Known Allergies  Family History  Problem Relation Age of Onset  . Heart failure Mother     Prior to Admission medications   Medication Sig Start Date End Date Taking? Authorizing Provider  cyclobenzaprine (FLEXERIL) 10 MG tablet Take 1 tablet (10 mg total) by mouth 2 (two) times daily as needed for muscle spasms. 11/22/18  Yes Domenic Moras, PA-C  gabapentin (NEURONTIN) 300 MG capsule Take 1 capsule (300 mg total) by mouth 3 (three) times daily. 11/11/18  Yes Johnn Hai, MD  hydrochlorothiazide (HYDRODIURIL) 25 MG tablet Take 25 mg by mouth daily.   Yes [provider]  lisinopril (ZESTRIL) 10 MG tablet Take 1 tablet (10 mg total) by mouth daily. Patient taking differently: Take 40 mg by mouth daily.  11/12/18  Yes Rankin, Shuvon B, NP  traZODone (DESYREL) 100 MG tablet Take 1 tablet (100 mg total) by mouth at bedtime as  needed for sleep. 11/11/18  Yes Johnn Hai, MD  ibuprofen (ADVIL) 600 MG tablet Take 1 tablet (600 mg total) by mouth every 6 (six) hours as needed. Patient not taking: Reported on 12/06/2018 11/22/18   Domenic Moras, PA-C  propranolol (INDERAL) 40 MG tablet Take 1 tablet (40 mg total) by mouth 2 (two) times daily. Patient not taking: Reported on 12/06/2018 11/11/18   Johnn Hai, MD  sertraline (ZOLOFT) 100 MG tablet Take 1  tablet (100 mg total) by mouth daily. Patient not taking: Reported on 12/06/2018 11/12/18   Johnn Hai, MD    Physical Exam:  Constitutional: Middle-aged male appears to be in no acute distress Vitals:   12/06/18 1300 12/06/18 1330 12/06/18 1400 12/06/18 1415  BP: 110/66 117/79 115/70 115/62  Pulse: 79 79 81 76  Resp:      Temp:      TempSrc:      SpO2: 93% 95% 97% 99%  Weight:      Height:       Eyes: PERRL, lids and conjunctivae normal ENMT: Significant swelling of upper and lower lips at this time.  Mild tongue swelling noted. Neck: No stridor appreciated at this time. Respiratory: clear to auscultation bilaterally, no wheezing, no crackles. Normal respiratory effort. No accessory muscle use.  Cardiovascular: Regular rate and rhythm, no murmurs / rubs / gallops. No lower extremity edema. 2+ pedal pulses. No carotid bruits.  Abdomen: no tenderness, no masses palpated. No hepatosplenomegaly. Bowel sounds positive.  Musculoskeletal: no clubbing / cyanosis. No joint deformity upper and lower extremities. Good ROM, no contractures. Normal muscle tone.  Skin: no rashes, lesions, ulcers. No induration Neurologic: CN 2-12 grossly intact. Sensation intact, DTR normal. Strength 5/5 in all 4.  Psychiatric: Normal judgment and insight. Alert and oriented x 3. Normal mood.     Labs on Admission: I have personally reviewed following labs and imaging studies  CBC: Recent Labs  Lab 12/06/18 0946  WBC 5.1  NEUTROABS 2.5  HGB 13.2  HCT 38.9*  MCV 87.6  PLT 481*   Basic Metabolic Panel: Recent Labs  Lab 12/06/18 0946  NA 129*  K 3.8  CL 94*  CO2 21*  GLUCOSE 116*  BUN 20  CREATININE 2.13*  CALCIUM 9.3   GFR: Estimated Creatinine Clearance: 50.1 mL/min (A) (by C-G formula based on SCr of 2.13 mg/dL (H)). Liver Function Tests: No results for input(s): AST, ALT, ALKPHOS, BILITOT, PROT, ALBUMIN in the last 168 hours. No results for input(s): LIPASE, AMYLASE in the last 168  hours. No results for input(s): AMMONIA in the last 168 hours. Coagulation Profile: No results for input(s): INR, PROTIME in the last 168 hours. Cardiac Enzymes: No results for input(s): CKTOTAL, CKMB, CKMBINDEX, TROPONINI in the last 168 hours. BNP (last 3 results) No results for input(s): PROBNP in the last 8760 hours. HbA1C: No results for input(s): HGBA1C in the last 72 hours. CBG: No results for input(s): GLUCAP in the last 168 hours. Lipid Profile: No results for input(s): CHOL, HDL, LDLCALC, TRIG, CHOLHDL, LDLDIRECT in the last 72 hours. Thyroid Function Tests: No results for input(s): TSH, T4TOTAL, FREET4, T3FREE, THYROIDAB in the last 72 hours. Anemia Panel: No results for input(s): VITAMINB12, FOLATE, FERRITIN, TIBC, IRON, RETICCTPCT in the last 72 hours. Urine analysis: No results found for: COLORURINE, APPEARANCEUR, LABSPEC, PHURINE, GLUCOSEU, HGBUR, BILIRUBINUR, KETONESUR, PROTEINUR, UROBILINOGEN, NITRITE, LEUKOCYTESUR Sepsis Labs: No results found for this or any previous visit (from the past 240 hour(s)).   Radiological Exams on  Admission: No results found.    Assessment/Plan Angioedema: Acute.  Patient presents with upper and lower lip swelling and signs of tongue swelling.  No airway compromise noted on physical exam.  Given initially Solu-Medrol, Pepcid, and Benadryl.  Symptoms had initially improved, but appear to be worsening now.  Possible medications include most recently started Flexeril, gabapentin, and lisinopril. -Admit to a MedSurg bed -Heart healthy diet as tolerated -Give 0.3 mg of epinephrine now, repeat epinephrine if needed for lip swelling -Solu-Medrol 60 mg every 8 hours -Continue Pepcid twice daily -Benadryl as needed for itching -Holding all possible inciting agents  Acute kidney injury: Patient baseline creatinine previously noted to be around 0.92 on 4/17, but presents with creatinine elevated up to 2.13 with BUN 20.  Suspect recent episodes  of diarrhea, but Flexeril can cause acute urinary retention. -Check renal ultrasound -IV fluids normal saline at 100 mL/h -Hold nephrotoxic agents  Diarrhea: Patient denies having any further episodes of diarrhea since being in the hospital. -Monitor intake and output  Polysubstance abuse: Patient admitted to using cocaine couple weeks ago.  He still reports smoking cigarettes and drinking alcohol -Check urine drug screen -Nicotine patch  -CWIA protocols -Continue to counsel on need of cessation of substances  DVT prophylaxis: Heparin Code Status: Full Family Communication: No family present at bedside Disposition Plan: Possible discharge home tomorrow Consults called: None Admission status: Observation  Norval Morton MD [facility] Pager [phone]   If 7PM-7AM, please contact night-coverage [email or web address] Password [facility]  12/06/2018, 2:51 PM

## 2018-12-06 NOTE — ED Triage Notes (Signed)
Patient taking medications and has has no changes.  Presents with swollen lips and some diarrhea since yesterday.   No difficulty breathing.

## 2018-12-06 NOTE — ED Provider Notes (Signed)
[facility] EMERGENCY DEPARTMENT Provider Note   CSN: [id number] Arrival date & time: 12/06/18  0901    History   Chief Complaint Chief Complaint  Patient presents with  . Allergic Reaction    swollen lips    HPI Alvin Warren is a 58 y.o. male with history of hypertension, PTSD presenting to emergency department today with chief complaint of lip swelling x1 day.  Patient states yesterday morning around 11 AM he noticed that his upper lip was starting to swell.  He states throughout the day the swelling progressively worsened.  Patient states he ate a can of vegetable beef soup last night which he has never had before. He also reports 3 episodes of non bloody diarrhea in the last 24 hours. He denies any associated nausea, vomiting, abdominal pain, urinary symptoms. Also denies difficulty breathing, swelling of his tongue, fever, chills, sick contacts. He has not taken any medications for his lip swelling or diarrhea prior to arrival.  Patient is on lisinopril and has been for 7 years. He last reports using Cocaine x 3 months ago. He drinks alcohol daily.   Of note patient was seen on 11/22/18 for back pain and was discharged home with Flexeril.  He last took Flexeril x2 days ago. History provided by patient with additional history obtained from chart review.    Past Medical History:  Diagnosis Date  . Alcohol withdrawal seizure ([place])   . Hypertension     Patient Active Problem List   Diagnosis Date Noted  . MDD (major depressive disorder), recurrent episode, severe ([place]) 11/07/2018  . Cocaine abuse ([place]) 11/07/2018    Past Surgical History:  Procedure Laterality Date  . BACK SURGERY          Home Medications    Prior to Admission medications   Medication Sig Start Date End Date Taking? Authorizing Provider  cyclobenzaprine (FLEXERIL) 10 MG tablet Take 1 tablet (10 mg total) by mouth 2 (two) times daily as needed for muscle spasms. 11/22/18  Yes Domenic Moras,  PA-C  gabapentin (NEURONTIN) 300 MG capsule Take 1 capsule (300 mg total) by mouth 3 (three) times daily. 11/11/18  Yes Johnn Hai, MD  hydrochlorothiazide (HYDRODIURIL) 25 MG tablet Take 25 mg by mouth daily.   Yes [provider]  lisinopril (ZESTRIL) 10 MG tablet Take 1 tablet (10 mg total) by mouth daily. Patient taking differently: Take 40 mg by mouth daily.  11/12/18  Yes Rankin, Shuvon B, NP  traZODone (DESYREL) 100 MG tablet Take 1 tablet (100 mg total) by mouth at bedtime as needed for sleep. 11/11/18  Yes Johnn Hai, MD  ibuprofen (ADVIL) 600 MG tablet Take 1 tablet (600 mg total) by mouth every 6 (six) hours as needed. Patient not taking: Reported on 12/06/2018 11/22/18   Domenic Moras, PA-C  propranolol (INDERAL) 40 MG tablet Take 1 tablet (40 mg total) by mouth 2 (two) times daily. Patient not taking: Reported on 12/06/2018 11/11/18   Johnn Hai, MD  sertraline (ZOLOFT) 100 MG tablet Take 1 tablet (100 mg total) by mouth daily. Patient not taking: Reported on 12/06/2018 11/12/18   Johnn Hai, MD    Family History Family History  Problem Relation Age of Onset  . Heart failure Mother     Social History Social History   Tobacco Use  . Smoking status: Current Every Day Smoker    Packs/day: 0.50    Types: Cigarettes  . Smokeless tobacco: Never Used  Substance Use Topics  .  Alcohol use: Yes    Comment: last used last night  . Drug use: Not Currently    Types: Cocaine    Comment: last used 2 months ago     Allergies   Patient has no known allergies.   Review of Systems Review of Systems  Constitutional: Negative for chills and fever.  HENT: Positive for facial swelling (lip swelling). Negative for congestion, rhinorrhea, sinus pressure, sore throat and trouble swallowing.   Eyes: Negative for pain and redness.  Respiratory: Negative for cough, shortness of breath and wheezing.   Cardiovascular: Negative for chest pain and palpitations.  Gastrointestinal:  Positive for diarrhea. Negative for abdominal pain, constipation, nausea and vomiting.  Genitourinary: Negative for dysuria.  Musculoskeletal: Negative for arthralgias, back pain, myalgias and neck pain.  Skin: Negative for rash and wound.  Neurological: Negative for dizziness, syncope, weakness, numbness and headaches.  Psychiatric/Behavioral: Negative for confusion.     Physical Exam Updated Vital Signs BP 117/79   Pulse 79   Temp 98.3 F (36.8 C) (Oral)   Resp (!) 24   Ht 6\' 3"  (1.905 m)   Wt 102 kg   SpO2 95%   BMI 28.11 kg/m   Physical Exam Vitals signs and nursing note reviewed.  Constitutional:      Appearance: He is well-developed. He is not toxic-appearing.  HENT:     Head: Normocephalic and atraumatic.     Mouth/Throat:     Mouth: Mucous membranes are moist. No angioedema.     Dentition: Has dentures.     Tongue: Tongue does not deviate from midline.     Pharynx: Oropharynx is clear. No pharyngeal swelling, posterior oropharyngeal erythema or uvula swelling.     Comments: Significant swelling to upper lip Eyes:     General: No scleral icterus.       Right eye: No discharge.        Left eye: No discharge.     Conjunctiva/sclera: Conjunctivae normal.  Neck:     Musculoskeletal: Normal range of motion. No muscular tenderness.  Cardiovascular:     Rate and Rhythm: Normal rate and regular rhythm.     Pulses: Normal pulses.     Heart sounds: Normal heart sounds.  Pulmonary:     Effort: Pulmonary effort is normal.     Breath sounds: Normal breath sounds.  Abdominal:     General: There is no distension.  Musculoskeletal: Normal range of motion.  Lymphadenopathy:     Cervical: No cervical adenopathy.  Skin:    General: Skin is warm and dry.  Neurological:     Mental Status: He is oriented to person, place, and time.     Comments: Fluent speech, no facial droop.  Psychiatric:        Behavior: Behavior normal.      ED Treatments / Results  Labs (all  labs ordered are listed, but only abnormal results are displayed) Labs Reviewed  CBC WITH DIFFERENTIAL/PLATELET - Abnormal; Notable for the following components:      Result Value   HCT 38.9 (*)    Platelets 131 (*)    All other components within normal limits  BASIC METABOLIC PANEL - Abnormal; Notable for the following components:   Sodium 129 (*)    Chloride 94 (*)    CO2 21 (*)    Glucose, Bld 116 (*)    Creatinine, Ser 2.13 (*)    GFR calc non Af Amer 34 (*)    GFR calc Af Wyvonnia Lora  39 (*)    All other components within normal limits  SARS CORONAVIRUS 2 (HOSPITAL ORDER, [place] [facility])    EKG None  Radiology No results found.  Procedures Procedures (including critical care time)  Medications Ordered in ED Medications  sodium chloride 0.9 % bolus 1,000 mL (has no administration in time range)  diphenhydrAMINE (BENADRYL) injection 25 mg (25 mg Intravenous Given 12/06/18 1011)  famotidine (PEPCID) IVPB 20 mg premix (0 mg Intravenous Stopped 12/06/18 1108)  methylPREDNISolone sodium succinate (SOLU-MEDROL) 125 mg/2 mL injection 125 mg (125 mg Intravenous Given 12/06/18 1010)     Initial Impression / Assessment and Plan / ED Course  I have reviewed the triage vital signs and the nursing notes.  Pertinent labs & imaging results that were available during my care of the patient were reviewed by me and considered in my medical decision making (see chart for details).  58 year old male presents with swelling of upper lip.  He takes lisinopril daily and has for 7 years. The swelling started 22 hours prior to arrival and he did not try any interventions. He is afebrile, non toxic appearing. DDx includes allergic reaction vs medication related angioedema. Pt has been taking Flexeril for back pain. His last dose was x 2 days ago, another possible cause of angioedema. On exam there is significant swelling to upper lip. His airway is intact, no swelling of tongue. He  denies difficulty breathing. Will give benadryl, pepcid, solumedrol and observe for 6 hours. Will get basic labs.  CBC without leukocytosis, overall unremarkable. BMP is significant for hyponatremia of 129 and creatinine of 2.13. BUN is 20. Compared to x1 month ago, pt's BUN/Creatinine was 7/0.92. IVF given. Given this new AKI will consult for admission.  On reassessment pt now has swelling to bottom lip as well. Airway is patent, no difficulty breathing. Pt states his tongue is feeling different. On exam no swelling noted to tongue. Covid test is negative. Pt case discussed with Dr. Rogene Houston who agrees with my plan. Spoke with Dr. Tamala Julian  with hospitalist service who agrees to assume care of patient and bring into the hospital for further evaluation and management.     This note was prepared with assistance of Systems analyst. Occasional wrong-word or sound-a-like substitutions may have occurred due to the inherent limitations of voice recognition software.    Final Clinical Impressions(s) / ED Diagnoses   Final diagnoses:  Angioedema, initial encounter  AKI (acute kidney injury) [facility])    ED Discharge Orders    None       Flint Melter 12/06/18 1907    Fredia Sorrow, MD 12/07/18 [phone]

## 2018-12-06 NOTE — ED Notes (Signed)
Pt to ultrasound then will go upstairs

## 2018-12-07 DIAGNOSIS — T783XXD Angioneurotic edema, subsequent encounter: Secondary | ICD-10-CM

## 2018-12-07 DIAGNOSIS — F1010 Alcohol abuse, uncomplicated: Secondary | ICD-10-CM

## 2018-12-07 DIAGNOSIS — I10 Essential (primary) hypertension: Secondary | ICD-10-CM

## 2018-12-07 DIAGNOSIS — Z720 Tobacco use: Secondary | ICD-10-CM

## 2018-12-07 DIAGNOSIS — T783XXA Angioneurotic edema, initial encounter: Secondary | ICD-10-CM | POA: Diagnosis present

## 2018-12-07 LAB — BASIC METABOLIC PANEL
Anion gap: 9 (ref 5–15)
BUN: 21 mg/dL — ABNORMAL HIGH (ref 6–20)
CO2: 22 mmol/L (ref 22–32)
Calcium: 9.1 mg/dL (ref 8.9–10.3)
Chloride: 100 mmol/L (ref 98–111)
Creatinine, Ser: 1.21 mg/dL (ref 0.61–1.24)
GFR calc Af Amer: >60 mL/min (ref >60)
GFR calc non Af Amer: >60 mL/min (ref >60)
Glucose, Bld: 124 mg/dL — ABNORMAL HIGH (ref 70–99)
Potassium: 4.1 mmol/L (ref 3.5–5.1)
Sodium: 131 mmol/L — ABNORMAL LOW (ref 135–145)

## 2018-12-07 LAB — CBC
HCT: 38.6 % — ABNORMAL LOW (ref 39.0–52.0)
Hemoglobin: 13.1 g/dL (ref 13.0–17.0)
MCH: 29.7 pg (ref 26.0–34.0)
MCHC: 33.9 g/dL (ref 30.0–36.0)
MCV: 87.5 fL (ref 80.0–100.0)
Platelets: 140 10*3/uL — ABNORMAL LOW (ref 150–400)
RBC: 4.41 MIL/uL (ref 4.22–5.81)
RDW: 13.6 % (ref 11.5–15.5)
WBC: 11.5 10*3/uL — ABNORMAL HIGH (ref 4.0–10.5)
nRBC: 0 % (ref 0.0–0.2)

## 2018-12-07 MED ORDER — NICOTINE 21 MG/24HR TD PT24: 21.0000 mg | MEDICATED_PATCH | Freq: Every day | TRANSDERMAL | 0 refills | Status: DC

## 2018-12-07 MED ORDER — HYDROCHLOROTHIAZIDE 25 MG PO TABS: 25.0000 mg | ORAL_TABLET | Freq: Every day | ORAL | 0 refills | Status: DC

## 2018-12-07 MED ORDER — FAMOTIDINE 20 MG PO TABS: 20.0000 mg | ORAL_TABLET | Freq: Two times a day (BID) | ORAL | 0 refills | Status: DC

## 2018-12-07 MED ORDER — DIPHENHYDRAMINE HCL 50 MG PO TABS: 25.0000 mg | ORAL_TABLET | Freq: Four times a day (QID) | ORAL | 0 refills | Status: DC | PRN

## 2018-12-07 MED ORDER — TAMSULOSIN HCL 0.4 MG PO CAPS: 0.4000 mg | ORAL_CAPSULE | Freq: Every day | ORAL | 0 refills | Status: AC

## 2018-12-07 MED ORDER — PREDNISONE 20 MG PO TABS: 40.0000 mg | ORAL_TABLET | Freq: Every day | ORAL | 0 refills | Status: DC

## 2018-12-07 NOTE — Progress Notes (Signed)
DISCHARGE NOTE HOME Zanden Colver to be discharged Home per MD order. Discussed prescriptions and follow up appointments with the patient. Prescriptions given to patient; medication list explained in detail. Patient verbalized understanding.  Skin clean, dry and intact without evidence of skin break down, no evidence of skin tears noted. IV catheter discontinued intact. Site without signs and symptoms of complications. Dressing and pressure applied. Pt denies pain at the site currently. No complaints noted.  Patient free of lines, drains, and wounds.   An After Visit Summary (AVS) was printed and given to the patient. Patient escorted via wheelchair, and discharged home via private auto.  Arlyss Repress, RN

## 2018-12-07 NOTE — Discharge Summary (Addendum)
Physician Discharge Summary  Alvin Warren CZY:[id number] DOB: 21-Jun-1961 DOA: 12/06/2018  PCP: Marliss Coots NP Admit date: 12/06/2018 Discharge date: 12/07/2018  Admitted From: Home Disposition: Home  Recommendations for Outpatient Follow-up:  1. Follow up with PCP in 1 week.  Patient has been listed as allergy to ACE inhibitor and ARB due to angioedema and should not be on it for life. 2. I have also discontinued patient's Flexeril and gabapentin with concern for angioedema.    Home Health: None Equipment/Devices: None  Discharge Condition: Fair CODE STATUS: Full code) Diet recommendation: Low-sodium   Discharge Diagnoses:  Principal Problem:   Angioedema of lips  Active Problems:   MDD (major depressive disorder), recurrent episode, severe (HCC)   Acute kidney injury ([place])   Essential hypertension   Tobacco abuse   ETOH abuse   Brief narrative/HPI 58 year old male with history of hypertension, prior polysubstance abuse (reports quitting cocaine), active tobacco and alcohol use presented with acute onset of swelling of his lips since the morning of admission.  He reports the night before he ate some beef stew which did not taste different.  He has been on lisinopril for several years.  Earlier this month he hurt his back and was seen in the ED and given some Flexeril which he was taking until the day of admission.  He also has been started on gabapentin for past 2 months for ?  Restlessness and has been helping his symptoms.  He also reported some nonbloody diarrhea that has now subsided.  He noted his tongue to be heavy and funny but denies any difficulty swallowing, hoarseness of voice or stridor. In the ED vitals were stable.  Blood work showed sodium of 129, creatinine of 2.13 suggestive of acute kidney injury was 0.92).  COVID-19 was tested and negative.  Patient given IV Solu-Medrol, Benadryl and Pepcid and placed on observation for acute angioedema and acute kidney  injury.  Hospital course Angioedema of the lips Patient is on multiple medications including lisinopril, Flexeril and gabapentin which could contribute.  Have discontinued all of them.  Listed lisinopril as adequately causing angioedema and he should be not be on both ACE inhibitor or ARB for life. Symptoms have improved on steroids, as needed Benadryl and Pepcid.  I will discharge him on oral prednisone for 5 days, twice daily Pepcid for 5 days and PRN Benadryl. Instructed him to return to the ED if he has further increase in the swelling of his lips, difficulty breathing, stridor or hoarseness of voice.  Acute kidney injury ([place]) Likely prerenal with dehydration.  Also on off and on NSAIDs for pain.  Patient on lisinopril and HCTZ which are both held.  Lisinopril will be discontinued.  Renal function has returned to baseline with IV fluids.  HCTZ can be resumed after 2 days.  Encouraged to keep himself hydrated.  Patient instructed to avoid NSAIDs.  Tobacco and alcohol use Counseled family on cessation.  Nicotine patch applied.  Patient reports that he has cut down significantly on drinking after he lost his job recently.  Essential hypertension Blood pressure stable.  Resume HCTZ after 2 days.  Also on propanolol.  Depression Continue trazodone at bedtime.  BPH symptoms with enlarged prostate Patient reports urinary urgency and hesitancy.  Noted for enlarged prostate on bladder ultrasound.  Prescribed Flomax.  Procedure: Renal ultrasound Consults: None Disposition: Home Family communication: None at bedside   Discharge Instructions   Allergies as of 12/07/2018      Reactions  Lisinopril    angioedema      Medication List    STOP taking these medications   cyclobenzaprine 10 MG tablet Commonly known as:  FLEXERIL   gabapentin 300 MG capsule Commonly known as:  NEURONTIN   ibuprofen 600 MG tablet Commonly known as:  ADVIL   lisinopril 10 MG tablet Commonly known  as:  ZESTRIL   sertraline 100 MG tablet Commonly known as:  ZOLOFT     TAKE these medications   diphenhydrAMINE 50 MG tablet Commonly known as:  BENADRYL Take 0.5 tablets (25 mg total) by mouth every 6 (six) hours as needed for itching or allergies (angioedema).   famotidine 20 MG tablet Commonly known as:  PEPCID Take 1 tablet (20 mg total) by mouth 2 (two) times daily.   hydrochlorothiazide 25 MG tablet Commonly known as:  HYDRODIURIL Take 1 tablet (25 mg total) by mouth daily. Start taking on:  Dec 09, 2018 What changed:  These instructions start on Dec 09, 2018. If you are unsure what to do until then, ask your doctor or other care provider.   nicotine 21 mg/24hr patch Commonly known as:  NICODERM CQ - dosed in mg/24 hours Place 1 patch (21 mg total) onto the skin daily.   predniSONE 20 MG tablet Commonly known as:  DELTASONE Take 2 tablets (40 mg total) by mouth daily.   propranolol 40 MG tablet Commonly known as:  INDERAL Take 1 tablet (40 mg total) by mouth 2 (two) times daily.   tamsulosin 0.4 MG Caps capsule Commonly known as:  Flomax Take 1 capsule (0.4 mg total) by mouth daily after supper for 30 days.   traZODone 100 MG tablet Commonly known as:  DESYREL Take 1 tablet (100 mg total) by mouth at bedtime as needed for sleep.      Follow-up Information    Placey, Audrea Muscat, NP. Schedule an appointment as soon as possible for a visit in 1 week(s).   Contact information: [place] [address] [phone]        Marigene Ehlers, [place] .   Specialty:  Chiropractic Medicine Contact information: [address] [place] [address] [phone]          Allergies  Allergen Reactions  . Lisinopril     angioedema        Procedures/Studies: Dg Lumbar Spine Complete  Result Date: 11/22/2018 CLINICAL DATA:  [place] yesterday.  Low back pain. EXAM: LUMBAR SPINE - COMPLETE 4+ VIEW COMPARISON:  None. FINDINGS: Normal alignment  of the lumbar spine. Severe disc space narrowing at L5-S1 with degenerative endplate changes. The other disc spaces are maintained. The vertebral body heights are maintained. Atherosclerotic calcifications in the abdominal aorta. Negative for a pars defect. IMPRESSION: 1. No acute abnormality in the lumbar spine. 2. Disc space narrowing and disease at L5-S1. Electronically Signed   By: Markus Daft M.D.   On: 11/22/2018 12:24   US Renal  Result Date: 12/06/2018 CLINICAL DATA:  Acute renal insufficiency EXAM: RENAL / URINARY TRACT ULTRASOUND COMPLETE COMPARISON:  None. FINDINGS: Right Kidney: Renal measurements: 12.9 x 5.2 x 5.6 cm = volume: 195 mL . Echogenicity within normal limits. No mass or hydronephrosis visualized. Left Kidney: Renal measurements: 12.6 x 5.7 x 6.1 cm = volume: 227 mL. Echogenicity within normal limits. No mass or hydronephrosis visualized. Bladder: Appears normal for degree of bladder distention. IMPRESSION: 1. The kidneys are normal in appearance. 2. The prostate is enlarged measuring 5.4 x  4.0 x 5.1 cm with a volume of 57 cc. Electronically Signed   By: Dorise Bullion III M.D   On: 12/06/2018 16:30   Dg Hip Unilat W Or Wo Pelvis 2-3 Views Right  Result Date: 11/22/2018 CLINICAL DATA:  Status post fall, right hip pain EXAM: DG HIP (WITH OR WITHOUT PELVIS) 2-3V RIGHT COMPARISON:  None. FINDINGS: There is no evidence of hip fracture or dislocation. Mild osteoarthritis of the right hip. No aggressive osseous lesion. IMPRESSION: No acute osseous injury of the right hip. Electronically Signed   By: Kathreen Devoid   On: 11/22/2018 12:22       Subjective: Reports his lip swelling has improved greater than 50% since yesterday.  Denies any difficulty breathing, hoarseness of voice, difficulty swallowing or stridor.  Discharge Exam: Vitals:   12/07/18 0506 12/07/18 0908  BP: 131/78 (!) 120/59  Pulse: 74 81  Resp: 18 18  Temp: 98.1 F (36.7 C) 98.2 F (36.8 C)  SpO2: 99% 98%    Vitals:   12/06/18 1707 12/06/18 2105 12/07/18 0506 12/07/18 0908  BP: 114/66 131/68 131/78 (!) 120/59  Pulse: 83 88 74 81  Resp: '20 18 18 18  ' Temp: 99 F (37.2 C) 98.6 F (37 C) 98.1 F (36.7 C) 98.2 F (36.8 C)  TempSrc: Oral Oral Oral Oral  SpO2: 98% 99% 99% 98%  Weight:      Height:        General: Middle-aged male not in distress HEENT: Swollen lips, moist mucosa, supple neck, Chest: Clear bilaterally CVS: S1-S2 GI: Soft, nondistended, nontender Musculoskeletal: Warm, no edema     The results of significant diagnostics from this hospitalization (including imaging, microbiology, ancillary and laboratory) are listed below for reference.     Microbiology: Recent Results (from the past 240 hour(s))  SARS Coronavirus 2 (CEPHEID - Performed in [place] hospital lab), Hosp Order     Status: None   Collection Time: 12/06/18  2:22 PM  Result Value Ref Range Status   SARS Coronavirus 2 NEGATIVE NEGATIVE Final    Comment: (NOTE) If result is NEGATIVE SARS-CoV-2 target nucleic acids are NOT DETECTED. The SARS-CoV-2 RNA is generally detectable in upper and lower  respiratory specimens during the acute phase of infection. The lowest  concentration of SARS-CoV-2 viral copies this assay can detect is 250  copies / mL. A negative result does not preclude SARS-CoV-2 infection  and should not be used as the sole basis for treatment or other  patient management decisions.  A negative result may occur with  improper specimen collection / handling, submission of specimen other  than nasopharyngeal swab, presence of viral mutation(s) within the  areas targeted by this assay, and inadequate number of viral copies  (<250 copies / mL). A negative result must be combined with clinical  observations, patient history, and epidemiological information. If result is POSITIVE SARS-CoV-2 target nucleic acids are DETECTED. The SARS-CoV-2 RNA is generally detectable in upper and lower   respiratory specimens dur ing the acute phase of infection.  Positive  results are indicative of active infection with SARS-CoV-2.  Clinical  correlation with patient history and other diagnostic information is  necessary to determine patient infection status.  Positive results do  not rule out bacterial infection or co-infection with other viruses. If result is PRESUMPTIVE POSTIVE SARS-CoV-2 nucleic acids MAY BE PRESENT.   A presumptive positive result was obtained on the submitted specimen  and confirmed on repeat testing.  While 2019 novel coronavirus  (  SARS-CoV-2) nucleic acids may be present in the submitted sample  additional confirmatory testing may be necessary for epidemiological  and / or clinical management purposes  to differentiate between  SARS-CoV-2 and other Sarbecovirus currently known to infect humans.  If clinically indicated additional testing with an alternate test  methodology [phone]) is advised. The SARS-CoV-2 RNA is generally  detectable in upper and lower respiratory sp ecimens during the acute  phase of infection. The expected result is Negative. Fact Sheet for Patients:  StrictlyIdeas.no Fact Sheet for Healthcare Providers: BankingDealers.co.za This test is not yet approved or cleared by the [place] FDA and has been authorized for detection and/or diagnosis of SARS-CoV-2 by FDA under an Emergency Use Authorization (EUA).  This EUA will remain in effect (meaning this test can be used) for the duration of the COVID-19 declaration under Section 564(b)(1) of the Act, 21 U.S.C. section 360bbb-3(b)(1), unless the authorization is terminated or revoked sooner. Performed at [facility], [place] [address], [place], [place] [address]      Labs: BNP (last 3 results) No results for input(s): BNP in the last 8760 hours. Basic Metabolic Panel: Recent Labs  Lab 12/06/18 0946 12/07/18 0341  NA 129* 131*   K 3.8 4.1  CL 94* 100  CO2 21* 22  GLUCOSE 116* 124*  BUN 20 21*  CREATININE 2.13* 1.21  CALCIUM 9.3 9.1   Liver Function Tests: No results for input(s): AST, ALT, ALKPHOS, BILITOT, PROT, ALBUMIN in the last 168 hours. No results for input(s): LIPASE, AMYLASE in the last 168 hours. No results for input(s): AMMONIA in the last 168 hours. CBC: Recent Labs  Lab 12/06/18 0946 12/07/18 0341  WBC 5.1 11.5*  NEUTROABS 2.5  --   HGB 13.2 13.1  HCT 38.9* 38.6*  MCV 87.6 87.5  PLT 131* 140*   Cardiac Enzymes: No results for input(s): CKTOTAL, CKMB, CKMBINDEX, TROPONINI in the last 168 hours. BNP: Invalid input(s): POCBNP CBG: No results for input(s): GLUCAP in the last 168 hours. D-Dimer No results for input(s): DDIMER in the last 72 hours. Hgb A1c No results for input(s): HGBA1C in the last 72 hours. Lipid Profile No results for input(s): CHOL, HDL, LDLCALC, TRIG, CHOLHDL, LDLDIRECT in the last 72 hours. Thyroid function studies No results for input(s): TSH, T4TOTAL, T3FREE, THYROIDAB in the last 72 hours.  Invalid input(s): FREET3 Anemia work up No results for input(s): VITAMINB12, FOLATE, FERRITIN, TIBC, IRON, RETICCTPCT in the last 72 hours. Urinalysis No results found for: COLORURINE, APPEARANCEUR, [place], [place], [place], [place], [place], [place], PROTEINUR, UROBILINOGEN, NITRITE, LEUKOCYTESUR Sepsis Labs Invalid input(s): PROCALCITONIN,  WBC,  LACTICIDVEN Microbiology Recent Results (from the past 240 hour(s))  SARS Coronavirus 2 (CEPHEID - Performed in [place] hospital lab), Hosp Order     Status: None   Collection Time: 12/06/18  2:22 PM  Result Value Ref Range Status   SARS Coronavirus 2 NEGATIVE NEGATIVE Final    Comment: (NOTE) If result is NEGATIVE SARS-CoV-2 target nucleic acids are NOT DETECTED. The SARS-CoV-2 RNA is generally detectable in upper and lower  respiratory specimens during the acute phase of infection. The lowest  concentration  of SARS-CoV-2 viral copies this assay can detect is 250  copies / mL. A negative result does not preclude SARS-CoV-2 infection  and should not be used as the sole basis for treatment or other  patient management decisions.  A negative result may occur with  improper specimen collection / handling, submission of specimen other  than  nasopharyngeal swab, presence of viral mutation(s) within the  areas targeted by this assay, and inadequate number of viral copies  (<250 copies / mL). A negative result must be combined with clinical  observations, patient history, and epidemiological information. If result is POSITIVE SARS-CoV-2 target nucleic acids are DETECTED. The SARS-CoV-2 RNA is generally detectable in upper and lower  respiratory specimens dur ing the acute phase of infection.  Positive  results are indicative of active infection with SARS-CoV-2.  Clinical  correlation with patient history and other diagnostic information is  necessary to determine patient infection status.  Positive results do  not rule out bacterial infection or co-infection with other viruses. If result is PRESUMPTIVE POSTIVE SARS-CoV-2 nucleic acids MAY BE PRESENT.   A presumptive positive result was obtained on the submitted specimen  and confirmed on repeat testing.  While 2019 novel coronavirus  (SARS-CoV-2) nucleic acids may be present in the submitted sample  additional confirmatory testing may be necessary for epidemiological  and / or clinical management purposes  to differentiate between  SARS-CoV-2 and other Sarbecovirus currently known to infect humans.  If clinically indicated additional testing with an alternate test  methodology [phone]) is advised. The SARS-CoV-2 RNA is generally  detectable in upper and lower respiratory sp ecimens during the acute  phase of infection. The expected result is Negative. Fact Sheet for Patients:  StrictlyIdeas.no Fact Sheet for Healthcare  Providers: BankingDealers.co.za This test is not yet approved or cleared by the [place] FDA and has been authorized for detection and/or diagnosis of SARS-CoV-2 by FDA under an Emergency Use Authorization (EUA).  This EUA will remain in effect (meaning this test can be used) for the duration of the COVID-19 declaration under Section 564(b)(1) of the Act, 21 U.S.C. section 360bbb-3(b)(1), unless the authorization is terminated or revoked sooner. Performed at [facility], [place] [address] [place], [place] [address]      Time coordinating discharge: <30 minutes  SIGNED:   Louellen Molder, MD  [facility] 12/07/2018, 9:46 AM Pager   If 7PM-7AM, please contact night-coverage [email or web address] Password [email or web address]

## 2018-12-07 NOTE — Discharge Instructions (Signed)
Angioedema  Angioedema is sudden swelling in the body. The swelling can happen in any part of the body. It often happens on the skin and causes itchy, bumpy patches (hives) to form. This condition may:  Happen only one time.  Happen more than one time. It may come back at random times.  Keep coming back for a number of years. Someday it may stop coming back. Follow these instructions at home:  Take over-the-counter and prescription medicines only as told by your doctor.  If you were given medicines for emergency allergy treatment, always carry them with you.  Wear a medical bracelet as told by your doctor.  Avoid the things that cause your attacks (triggers).  If this condition was passed to you from your parents and you want to have kids, talk to your doctor. Your kids may also have this condition. Contact a doctor if:  You have another attack.  Your attacks happen more often, even after you take steps to prevent them.  This condition was passed to you by your parents and you want to have kids. Get help right away if:  Your mouth, tongue, or lips get very swollen.  You have trouble breathing.  You have trouble swallowing.  You pass out (faint). This information is not intended to replace advice given to you by your health care provider. Make sure you discuss any questions you have with your health care provider. Document Released: 06/26/2009 Document Revised: 02/07/2016 Document Reviewed: 01/16/2016 Elsevier Interactive Patient Education  2019 [employer]vier Inc.  

## 2019-02-03 ENCOUNTER — Other Ambulatory Visit: Payer: Self-pay

## 2019-02-03 ENCOUNTER — Emergency Department (HOSPITAL_COMMUNITY)
Admission: EM | Admit: 2019-02-03 | Discharge: 2019-02-04 | Disposition: A | Discharge status: Other Acute Inpatient Hospital | Payer: Self-pay | Attending: Emergency Medicine | Admitting: Emergency Medicine

## 2019-02-03 DIAGNOSIS — F1020 Alcohol dependence, uncomplicated: Secondary | ICD-10-CM | POA: Insufficient documentation

## 2019-02-03 DIAGNOSIS — Z79899 Other long term (current) drug therapy: Secondary | ICD-10-CM | POA: Insufficient documentation

## 2019-02-03 DIAGNOSIS — F332 Major depressive disorder, recurrent severe without psychotic features: Secondary | ICD-10-CM | POA: Insufficient documentation

## 2019-02-03 DIAGNOSIS — Y908 Blood alcohol level of 240 mg/100 ml or more: Secondary | ICD-10-CM | POA: Insufficient documentation

## 2019-02-03 DIAGNOSIS — F32A Depression, unspecified: Secondary | ICD-10-CM

## 2019-02-03 DIAGNOSIS — F329 Major depressive disorder, single episode, unspecified: Secondary | ICD-10-CM

## 2019-02-03 DIAGNOSIS — F17210 Nicotine dependence, cigarettes, uncomplicated: Secondary | ICD-10-CM | POA: Insufficient documentation

## 2019-02-03 DIAGNOSIS — F1010 Alcohol abuse, uncomplicated: Secondary | ICD-10-CM

## 2019-02-03 DIAGNOSIS — I10 Essential (primary) hypertension: Secondary | ICD-10-CM | POA: Insufficient documentation

## 2019-02-03 DIAGNOSIS — Z20828 Contact with and (suspected) exposure to other viral communicable diseases: Secondary | ICD-10-CM | POA: Insufficient documentation

## 2019-02-03 LAB — BASIC METABOLIC PANEL
Anion gap: 13 (ref 5–15)
BUN: 5 mg/dL — ABNORMAL LOW (ref 6–20)
CO2: 22 mmol/L (ref 22–32)
Calcium: 9.1 mg/dL (ref 8.9–10.3)
Chloride: 103 mmol/L (ref 98–111)
Creatinine, Ser: 0.99 mg/dL (ref 0.61–1.24)
GFR calc Af Amer: >60 mL/min (ref >60)
GFR calc non Af Amer: >60 mL/min (ref >60)
Glucose, Bld: 97 mg/dL (ref 70–99)
Potassium: 4 mmol/L (ref 3.5–5.1)
Sodium: 138 mmol/L (ref 135–145)

## 2019-02-03 LAB — CBC WITH DIFFERENTIAL/PLATELET
Abs Immature Granulocytes: 0 10*3/uL (ref 0.00–0.07)
Basophils Absolute: 0 10*3/uL (ref 0.0–0.1)
Basophils Relative: 1 %
Eosinophils Absolute: 0 10*3/uL (ref 0.0–0.5)
Eosinophils Relative: 1 %
HCT: 45 % (ref 39.0–52.0)
Hemoglobin: 15.5 g/dL (ref 13.0–17.0)
Lymphocytes Relative: 60 %
Lymphs Abs: 1.9 10*3/uL (ref 0.7–4.0)
MCH: 30.5 pg (ref 26.0–34.0)
MCHC: 34.4 g/dL (ref 30.0–36.0)
MCV: 88.6 fL (ref 80.0–100.0)
Monocytes Absolute: 0.2 10*3/uL (ref 0.1–1.0)
Monocytes Relative: 5 %
Neutro Abs: 1.1 10*3/uL — ABNORMAL LOW (ref 1.7–7.7)
Neutrophils Relative %: 33 %
Platelets: 171 10*3/uL (ref 150–400)
RBC: 5.08 MIL/uL (ref 4.22–5.81)
RDW: 14.2 % (ref 11.5–15.5)
WBC: 3.2 10*3/uL — ABNORMAL LOW (ref 4.0–10.5)
nRBC: 0 % (ref 0.0–0.2)
nRBC: 0 /100 WBC

## 2019-02-03 LAB — RAPID URINE DRUG SCREEN, HOSP PERFORMED
Amphetamines: NOT DETECTED
Barbiturates: NOT DETECTED
Benzodiazepines: POSITIVE — AB
Cocaine: NOT DETECTED
Opiates: NOT DETECTED
Tetrahydrocannabinol: NOT DETECTED

## 2019-02-03 LAB — ETHANOL: Alcohol, Ethyl (B): 317 mg/dL (ref <10)

## 2019-02-03 MED ORDER — FAMOTIDINE 20 MG PO TABS
20.0000 mg | ORAL_TABLET | Freq: Two times a day (BID) | ORAL | Status: DC
  Administered 2019-02-04 (×2): 20 mg via ORAL
  Filled 2019-02-03 (×2): qty 1

## 2019-02-03 MED ORDER — VITAMIN B-1 100 MG PO TABS
100.0000 mg | ORAL_TABLET | Freq: Every day | ORAL | Status: DC
  Administered 2019-02-03 – 2019-02-04 (×2): 100 mg via ORAL
  Filled 2019-02-03 (×2): qty 1

## 2019-02-03 MED ORDER — LORAZEPAM 1 MG PO TABS: 0.0000 mg | ORAL_TABLET | Freq: Two times a day (BID) | ORAL | Status: DC

## 2019-02-03 MED ORDER — PROPRANOLOL HCL 40 MG PO TABS
40.0000 mg | ORAL_TABLET | Freq: Two times a day (BID) | ORAL | Status: DC
  Administered 2019-02-03 – 2019-02-04 (×2): 40 mg via ORAL
  Filled 2019-02-03 (×3): qty 1

## 2019-02-03 MED ORDER — THIAMINE HCL 100 MG/ML IJ SOLN: 100.0000 mg | Freq: Every day | INTRAMUSCULAR | Status: DC

## 2019-02-03 MED ORDER — ADULT MULTIVITAMIN W/MINERALS CH
1.0000 | ORAL_TABLET | Freq: Once | ORAL | Status: AC
  Administered 2019-02-03: 1 via ORAL
  Filled 2019-02-03: qty 1

## 2019-02-03 MED ORDER — LORAZEPAM 2 MG/ML IJ SOLN: 0.0000 mg | Freq: Four times a day (QID) | INTRAMUSCULAR | Status: DC

## 2019-02-03 MED ORDER — HYDROCHLOROTHIAZIDE 25 MG PO TABS
25.0000 mg | ORAL_TABLET | Freq: Every day | ORAL | Status: DC
  Administered 2019-02-03 – 2019-02-04 (×2): 25 mg via ORAL
  Filled 2019-02-03 (×2): qty 1

## 2019-02-03 MED ORDER — NICOTINE 21 MG/24HR TD PT24
21.0000 mg | MEDICATED_PATCH | Freq: Every day | TRANSDERMAL | Status: DC
  Administered 2019-02-04: 11:00:00 21 mg via TRANSDERMAL
  Filled 2019-02-03: qty 1

## 2019-02-03 MED ORDER — LORAZEPAM 2 MG/ML IJ SOLN: 0.0000 mg | Freq: Two times a day (BID) | INTRAMUSCULAR | Status: DC

## 2019-02-03 MED ORDER — LORAZEPAM 1 MG PO TABS
0.0000 mg | ORAL_TABLET | Freq: Four times a day (QID) | ORAL | Status: DC
  Administered 2019-02-03 – 2019-02-04 (×2): 1 mg via ORAL
  Administered 2019-02-04: 2 mg via ORAL
  Administered 2019-02-04: 1 mg via ORAL
  Administered 2019-02-04: 2 mg via ORAL
  Filled 2019-02-03: qty 1
  Filled 2019-02-03: qty 2
  Filled 2019-02-03: qty 1
  Filled 2019-02-03: qty 2
  Filled 2019-02-03: qty 1

## 2019-02-03 MED ORDER — LORAZEPAM 2 MG/ML IJ SOLN
2.0000 mg | Freq: Once | INTRAMUSCULAR | Status: AC
  Administered 2019-02-03: 2 mg via INTRAVENOUS
  Filled 2019-02-03: qty 1

## 2019-02-03 MED ORDER — SODIUM CHLORIDE 0.9 % IV BOLUS
1000.0000 mL | Freq: Once | INTRAVENOUS | Status: AC
  Administered 2019-02-03: 17:00:00 1000 mL via INTRAVENOUS

## 2019-02-03 MED ORDER — THIAMINE HCL 100 MG/ML IJ SOLN
100.0000 mg | Freq: Once | INTRAMUSCULAR | Status: AC
  Administered 2019-02-03: 17:00:00 100 mg via INTRAVENOUS
  Filled 2019-02-03: qty 2

## 2019-02-03 MED ORDER — FOLIC ACID 1 MG PO TABS
1.0000 mg | ORAL_TABLET | Freq: Once | ORAL | Status: AC
  Administered 2019-02-03: 20:00:00 1 mg via ORAL
  Filled 2019-02-03: qty 1

## 2019-02-03 NOTE — ED Triage Notes (Signed)
Wife called EMS requesting both of them have detox for ETOH.  Doesn't remember last intake, does know that he drank today.  Reports 4- 40 oz beer a day intake.  Pt alert with tremors noted and anxiety.   States he doesn't want to live like this anymore.  Pt cooperative and tearful.

## 2019-02-03 NOTE — ED Notes (Signed)
Pt sleeping at this time with no observed tremors.

## 2019-02-03 NOTE — ED Provider Notes (Signed)
[facility] EMERGENCY DEPARTMENT Provider Note   CSN: [id number] Arrival date & time: 02/03/19  1621     History   Chief Complaint Chief Complaint  Patient presents with  . Alcohol Intoxication  . Suicidal    HPI Alvin Warren is a 58 y.o. male.     HPI   58 year old male with alcohol abuse.  Longstanding history of the same.  He states that he is homeless.  His wife is also an alcoholic and called EMS today for the both of them.  Patient endorses feeling of hopelessness.  He states that he just does not want to live anymore.  Reports that his last drink was earlier today.  Drinks approximately 4 40 ounce beers per day.  States he has been drinking since he was 58 years old.  He was able to get sober for period of 2 years at one point.  He states he has had some thoughts of stepping out into traffic.  He denies ever trying to harm himself previously although he has had thoughts of it before.  Past Medical History:  Diagnosis Date  . Alcohol withdrawal seizure ([place])   . Hypertension     Patient Active Problem List   Diagnosis Date Noted  . Essential hypertension 12/07/2018  . Tobacco abuse 12/07/2018  . ETOH abuse 12/07/2018  . Angioedema of lips 12/07/2018  . Acute kidney injury ([place]) 12/06/2018  . MDD (major depressive disorder), recurrent episode, severe ([place]) 11/07/2018  . Cocaine abuse ([place]) 11/07/2018    Past Surgical History:  Procedure Laterality Date  . BACK SURGERY          Home Medications    Prior to Admission medications   Medication Sig Start Date End Date Taking? Authorizing Provider  diphenhydrAMINE (BENADRYL) 50 MG tablet Take 0.5 tablets (25 mg total) by mouth every 6 (six) hours as needed for itching or allergies (angioedema). 12/07/18   Dhungel, Flonnie Overman, MD  famotidine (PEPCID) 20 MG tablet Take 1 tablet (20 mg total) by mouth 2 (two) times daily. 12/07/18   Dhungel, Flonnie Overman, MD  hydrochlorothiazide (HYDRODIURIL) 25 MG tablet  Take 1 tablet (25 mg total) by mouth daily. 12/09/18   Dhungel, Nishant, MD  nicotine (NICODERM CQ - DOSED IN MG/24 HOURS) 21 mg/24hr patch Place 1 patch (21 mg total) onto the skin daily. 12/07/18   Dhungel, Nishant, MD  predniSONE (DELTASONE) 20 MG tablet Take 2 tablets (40 mg total) by mouth daily. 12/07/18   Dhungel, Flonnie Overman, MD  propranolol (INDERAL) 40 MG tablet Take 1 tablet (40 mg total) by mouth 2 (two) times daily. Patient not taking: Reported on 12/06/2018 11/11/18   Johnn Hai, MD  traZODone (DESYREL) 100 MG tablet Take 1 tablet (100 mg total) by mouth at bedtime as needed for sleep. 11/11/18   Johnn Hai, MD    Family History Family History  Problem Relation Age of Onset  . Heart failure Mother     Social History Social History   Tobacco Use  . Smoking status: Current Every Day Smoker    Packs/day: 0.50    Types: Cigarettes  . Smokeless tobacco: Never Used  Substance Use Topics  . Alcohol use: Yes    Comment: last used last night  . Drug use: Not Currently    Types: Cocaine    Comment: last used 2 months ago     Allergies   Lisinopril   Review of Systems Review of Systems  All systems reviewed and negative, other  than as noted in HPI.  Physical Exam Updated Vital Signs BP (!) 146/113   Pulse 63   Temp 98 F (36.7 C) (Oral)   Resp 18   SpO2 98%   Physical Exam Vitals signs and nursing note reviewed.  Constitutional:      Appearance: He is well-developed. He is diaphoretic.     Comments: Anxious.  Tearful at times.  Diaphoretic  HENT:     Head: Normocephalic and atraumatic.  Eyes:     General:        Right eye: No discharge.        Left eye: No discharge.     Conjunctiva/sclera: Conjunctivae normal.  Neck:     Musculoskeletal: Neck supple.  Cardiovascular:     Rate and Rhythm: Normal rate and regular rhythm.     Heart sounds: Normal heart sounds. No murmur. No friction rub. No gallop.   Pulmonary:     Effort: Pulmonary effort is normal. No  respiratory distress.     Breath sounds: Normal breath sounds.  Abdominal:     General: There is no distension.     Palpations: Abdomen is soft.     Tenderness: There is no abdominal tenderness.  Musculoskeletal:        General: No tenderness.  Skin:    General: Skin is warm.  Neurological:     Mental Status: He is alert.  Psychiatric:        Thought Content: Thought content normal.     Comments: Anxious but cooperative.  Tearful.  Does not appear to responding to internal stimuli.      ED Treatments / Results  Labs (all labs ordered are listed, but only abnormal results are displayed) Labs Reviewed  CBC WITH DIFFERENTIAL/PLATELET - Abnormal; Notable for the following components:      Result Value   WBC 3.2 (*)    Neutro Abs 1.1 (*)    All other components within normal limits  BASIC METABOLIC PANEL - Abnormal; Notable for the following components:   BUN 5 (*)    All other components within normal limits  ETHANOL - Abnormal; Notable for the following components:   Alcohol, Ethyl (B) 317 (*)    All other components within normal limits  RAPID URINE DRUG SCREEN, HOSP PERFORMED    EKG EKG Interpretation  Date/Time:  Wednesday February 03 2019 16:22:49 EDT Ventricular Rate:  90 PR Interval:    QRS Duration: 94 QT Interval:  361 QTC Calculation: 442 R Axis:   68 Text Interpretation:  Sinus rhythm Biatrial enlargement Baseline wander in lead(s) V2 V3 Confirmed by Virgel Manifold [phone]) on 02/03/2019 4:44:26 PM   Radiology No results found.  Procedures Procedures (including critical care time)  Medications Ordered in ED Medications  LORazepam (ATIVAN) injection 2 mg (has no administration in time range)  thiamine (B-1) injection 100 mg (has no administration in time range)  multivitamin with minerals tablet 1 tablet (has no administration in time range)  folic acid (FOLVITE) tablet 1 mg (has no administration in time range)     Initial Impression / Assessment and  Plan / ED Course  I have reviewed the triage vital signs and the nursing notes.  Pertinent labs & imaging results that were available during my care of the patient were reviewed by me and considered in my medical decision making (see chart for details).  59 year old male with alcohol use and depression.  He reports that he has been drinking alcohol since the age  of 14.  He was able to get himself sober for a period of 2 years though.  Not sure how much she can actually offer him for this issue in the emergency room.  He is tearful though and expressing feelings of hopelessness.  Having some thoughts of stepping out in front of traffic although he denies ever trying to harm himself previously.  Will medically clear for TTS evaluation.  Final Clinical Impressions(s) / ED Diagnoses   Final diagnoses:  Alcohol abuse  Depression, unspecified depression type    ED Discharge Orders    None       Virgel Manifold, MD 02/03/19 1920

## 2019-02-03 NOTE — BH Assessment (Signed)
Clinician contacted pt's nurse, Anderson Malta RN, in an attempt to complete pt's [place] Assessment. Pt is currently under the influence of EtOH with BAL 317 and has been given Ativan to assist with w/d; unable to complete assessment at this time. Will attempt to contact pt's nurse at a later time to complete pt's assessment when he is more coherent/no longer experiencing the effects of EtOH, w/d, or Ativan.

## 2019-02-03 NOTE — ED Notes (Signed)
Pt asking for something for anxiety due to activities in unit.  Pt appears to have tremors however did not 10 minutes ago.  Pt told he could not have anything at this time due to having just gotten some medication.  Will continue to monitor.

## 2019-02-03 NOTE — ED Notes (Signed)
Gave pt a sandwich bag with cheese and crackers. Watched while the pt opened bag, sandwich and cheese and pt needed no assistance. No tremors noted

## 2019-02-04 ENCOUNTER — Other Ambulatory Visit: Payer: Self-pay | Admitting: Behavioral Health

## 2019-02-04 ENCOUNTER — Emergency Department (HOSPITAL_COMMUNITY): Payer: Self-pay

## 2019-02-04 ENCOUNTER — Inpatient Hospital Stay (HOSPITAL_COMMUNITY)
Admission: AD | Admit: 2019-02-04 | Discharge: 2019-02-08 | DRG: 881 | Disposition: A | Discharge status: Home / Self Care | Payer: Federal, State, Local not specified - Other | Attending: Psychiatry | Admitting: Psychiatry

## 2019-02-04 DIAGNOSIS — J45909 Unspecified asthma, uncomplicated: Secondary | ICD-10-CM | POA: Diagnosis present

## 2019-02-04 DIAGNOSIS — Z1159 Encounter for screening for other viral diseases: Secondary | ICD-10-CM | POA: Diagnosis not present

## 2019-02-04 DIAGNOSIS — R45851 Suicidal ideations: Secondary | ICD-10-CM | POA: Diagnosis present

## 2019-02-04 DIAGNOSIS — Z590 Homelessness: Secondary | ICD-10-CM | POA: Diagnosis not present

## 2019-02-04 DIAGNOSIS — I10 Essential (primary) hypertension: Secondary | ICD-10-CM | POA: Diagnosis present

## 2019-02-04 DIAGNOSIS — Y908 Blood alcohol level of 240 mg/100 ml or more: Secondary | ICD-10-CM | POA: Diagnosis present

## 2019-02-04 DIAGNOSIS — F329 Major depressive disorder, single episode, unspecified: Principal | ICD-10-CM | POA: Diagnosis present

## 2019-02-04 DIAGNOSIS — F17210 Nicotine dependence, cigarettes, uncomplicated: Secondary | ICD-10-CM | POA: Diagnosis present

## 2019-02-04 DIAGNOSIS — F1020 Alcohol dependence, uncomplicated: Secondary | ICD-10-CM | POA: Diagnosis present

## 2019-02-04 LAB — BASIC METABOLIC PANEL
Anion gap: 11 (ref 5–15)
BUN: 9 mg/dL (ref 6–20)
CO2: 27 mmol/L (ref 22–32)
Calcium: 9.5 mg/dL (ref 8.9–10.3)
Chloride: 98 mmol/L (ref 98–111)
Creatinine, Ser: 1.18 mg/dL (ref 0.61–1.24)
GFR calc Af Amer: >60 mL/min (ref >60)
GFR calc non Af Amer: >60 mL/min (ref >60)
Glucose, Bld: 124 mg/dL — ABNORMAL HIGH (ref 70–99)
Potassium: 4 mmol/L (ref 3.5–5.1)
Sodium: 136 mmol/L (ref 135–145)

## 2019-02-04 LAB — CBC
HCT: 44.7 % (ref 39.0–52.0)
Hemoglobin: 15.2 g/dL (ref 13.0–17.0)
MCH: 30.3 pg (ref 26.0–34.0)
MCHC: 34 g/dL (ref 30.0–36.0)
MCV: 89 fL (ref 80.0–100.0)
Platelets: 161 10*3/uL (ref 150–400)
RBC: 5.02 MIL/uL (ref 4.22–5.81)
RDW: 13.9 % (ref 11.5–15.5)
WBC: 4.9 10*3/uL (ref 4.0–10.5)
nRBC: 0 % (ref 0.0–0.2)

## 2019-02-04 LAB — TROPONIN I (HIGH SENSITIVITY)
Troponin I (High Sensitivity): 10 ng/L (ref <18)
Troponin I (High Sensitivity): 9 ng/L (ref <18)

## 2019-02-04 LAB — SARS CORONAVIRUS 2 BY RT PCR (HOSPITAL ORDER, PERFORMED IN ~~LOC~~ HOSPITAL LAB): SARS Coronavirus 2: NEGATIVE

## 2019-02-04 MED ORDER — HYDROXYZINE HCL 25 MG PO TABS
25.0000 mg | ORAL_TABLET | Freq: Four times a day (QID) | ORAL | Status: AC | PRN
  Administered 2019-02-04 – 2019-02-06 (×3): 25 mg via ORAL
  Filled 2019-02-04 (×3): qty 1

## 2019-02-04 MED ORDER — THIAMINE HCL 100 MG/ML IJ SOLN
100.0000 mg | Freq: Once | INTRAMUSCULAR | Status: AC
  Administered 2019-02-05: 100 mg via INTRAMUSCULAR

## 2019-02-04 MED ORDER — MAGNESIUM HYDROXIDE 400 MG/5ML PO SUSP: 30.0000 mL | Freq: Every day | ORAL | Status: DC | PRN

## 2019-02-04 MED ORDER — ACETAMINOPHEN 325 MG PO TABS
650.0000 mg | ORAL_TABLET | Freq: Four times a day (QID) | ORAL | Status: DC | PRN
  Administered 2019-02-05 – 2019-02-07 (×5): 650 mg via ORAL
  Filled 2019-02-04 (×5): qty 2

## 2019-02-04 MED ORDER — ADULT MULTIVITAMIN W/MINERALS CH
1.0000 | ORAL_TABLET | Freq: Every day | ORAL | Status: DC
  Administered 2019-02-05 – 2019-02-08 (×4): 1 via ORAL
  Filled 2019-02-04 (×5): qty 1

## 2019-02-04 MED ORDER — ONDANSETRON 4 MG PO TBDP: 4.0000 mg | ORAL_TABLET | Freq: Four times a day (QID) | ORAL | Status: AC | PRN

## 2019-02-04 MED ORDER — ALUM & MAG HYDROXIDE-SIMETH 200-200-20 MG/5ML PO SUSP
30.0000 mL | Freq: Once | ORAL | Status: AC
  Administered 2019-02-04: 30 mL via ORAL
  Filled 2019-02-04: qty 30

## 2019-02-04 MED ORDER — LOPERAMIDE HCL 2 MG PO CAPS
2.0000 mg | ORAL_CAPSULE | ORAL | Status: AC | PRN
  Administered 2019-02-04: 4 mg via ORAL
  Filled 2019-02-04: qty 2

## 2019-02-04 MED ORDER — ACETAMINOPHEN 325 MG PO TABS
650.0000 mg | ORAL_TABLET | Freq: Once | ORAL | Status: AC
  Administered 2019-02-04: 16:00:00 650 mg via ORAL
  Filled 2019-02-04: qty 2

## 2019-02-04 MED ORDER — LIDOCAINE VISCOUS HCL 2 % MT SOLN
15.0000 mL | Freq: Once | OROMUCOSAL | Status: AC
  Administered 2019-02-04: 15 mL via ORAL
  Filled 2019-02-04: qty 15

## 2019-02-04 MED ORDER — LORAZEPAM 1 MG PO TABS
1.0000 mg | ORAL_TABLET | Freq: Four times a day (QID) | ORAL | Status: DC | PRN
  Administered 2019-02-05 (×2): 1 mg via ORAL
  Filled 2019-02-04 (×2): qty 1

## 2019-02-04 MED ORDER — VITAMIN B-1 100 MG PO TABS
100.0000 mg | ORAL_TABLET | Freq: Every day | ORAL | Status: DC
  Administered 2019-02-05 – 2019-02-08 (×4): 100 mg via ORAL
  Filled 2019-02-04 (×5): qty 1

## 2019-02-04 MED ORDER — TRAZODONE HCL 50 MG PO TABS
50.0000 mg | ORAL_TABLET | Freq: Every evening | ORAL | Status: DC | PRN
  Administered 2019-02-04 – 2019-02-07 (×2): 50 mg via ORAL
  Filled 2019-02-04: qty 7
  Filled 2019-02-04 (×3): qty 1

## 2019-02-04 MED ORDER — ALUM & MAG HYDROXIDE-SIMETH 200-200-20 MG/5ML PO SUSP: 30.0000 mL | ORAL | Status: DC | PRN

## 2019-02-04 NOTE — BHH Counselor (Signed)
  [facility] Assessment Disposition:   [facility] discussed case with [place] Provider, Mordecai Maes, NP who recommends inpatient treatment.  CSW will look for placement.  Keaton Beichner L. [place], [place], [facility], [facility] Therapeutic Triage Specialist  [phone]

## 2019-02-04 NOTE — ED Notes (Signed)
Patient requested his IV be removed.

## 2019-02-04 NOTE — ED Notes (Signed)
Sudden 10/10 CP reported to EDPA

## 2019-02-04 NOTE — Progress Notes (Signed)
Pt. meets criteria for inpatient treatment per Mordecai Maes, NP.  No appropriate beds available at [facility]. Referred out to the following hospitals:  [facility] Details [facility] Details [place] Details [facility] Details [facility] Details [facility] Details [facility] Details [place] Details [facility] Details [facility] Details [facility]  Disposition CSW will continue to follow for placement.  Areatha Keas. Judi Cong, MSW, [place] Disposition Clinical Social Work [phone] (cell) [phone] (office)

## 2019-02-04 NOTE — ED Notes (Signed)
Breakfast tray ordered 

## 2019-02-04 NOTE — ED Notes (Signed)
Lunch ordered 

## 2019-02-04 NOTE — Progress Notes (Signed)
Pt accepted to to [facility] Bed 301-1 Mordecai Maes, NP is the accepting provider.  Myles Lipps, MD. is the attending provider.  Call report to [facility] ED notified.   Pt is Voluntary.  Pt may be transported by [facility]  Pt scheduled  to arrive at [facility] as soon as there is a documentation of medical clearance  Romie Minus T. Judi Cong, MSW, [place] Disposition Clinical Social Work [phone] (cell) [phone] (office)
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
## 2019-02-04 NOTE — ED Provider Notes (Addendum)
Pt presenting for evaluation for SI and ETOH abuse. unable to complete [facility] assessment yesterday due to being intoxicated and being given ativan. This morning, BHH recommends inpatient tx, and TTS looking for placement.   CIWA 10, improved from 16 earlier this AM. Will continue to monitor and tx for withdrawal sxs as appropriate.   Upon evaluation, pt calm and cooperative, laying in bed.   Today's Vitals   02/04/19 0145 02/04/19 0243 02/04/19 0352 02/04/19 0635  BP:    (!) 146/83  Pulse:    62  Resp:    20  Temp:    98.9 F (37.2 C)  TempSrc:    Oral  SpO2:    97%  PainSc: Asleep Asleep Asleep    There is no height or weight on file to calculate BMI.   Results for orders placed or performed during the hospital encounter of 02/03/19  SARS Coronavirus 2 (CEPHEID - Performed in [place] hospital lab), [facility] Order   Specimen: Nasopharyngeal Swab  Result Value Ref Range   SARS Coronavirus 2 NEGATIVE NEGATIVE  CBC with Differential  Result Value Ref Range   WBC 3.2 (L) 4.0 - 10.5 K/uL   RBC 5.08 4.22 - 5.81 MIL/uL   Hemoglobin 15.5 13.0 - 17.0 g/dL   HCT 45.0 39.0 - 52.0 %   MCV 88.6 80.0 - 100.0 fL   MCH 30.5 26.0 - 34.0 pg   MCHC 34.4 30.0 - 36.0 g/dL   RDW 14.2 11.5 - 15.5 %   Platelets 171 150 - 400 K/uL   nRBC 0.0 0.0 - 0.2 %   Neutrophils Relative % 33 %   Neutro Abs 1.1 (L) 1.7 - 7.7 K/uL   Lymphocytes Relative 60 %   Lymphs Abs 1.9 0.7 - 4.0 K/uL   Monocytes Relative 5 %   Monocytes Absolute 0.2 0.1 - 1.0 K/uL   Eosinophils Relative 1 %   Eosinophils Absolute 0.0 0.0 - 0.5 K/uL   Basophils Relative 1 %   Basophils Absolute 0.0 0.0 - 0.1 K/uL   nRBC 0 0 /100 WBC   Abs Immature Granulocytes 0.00 0.00 - 0.07 K/uL  Basic metabolic panel  Result Value Ref Range   Sodium 138 135 - 145 mmol/L   Potassium 4.0 3.5 - 5.1 mmol/L   Chloride 103 98 - 111 mmol/L   CO2 22 22 - 32 mmol/L   Glucose, Bld 97 70 - 99 mg/dL   BUN 5 (L) 6 - 20 mg/dL   Creatinine, Ser 0.99 0.61 -  1.24 mg/dL   Calcium 9.1 8.9 - 10.3 mg/dL   GFR calc non Af Amer >60 >60 mL/min   GFR calc Af Amer >60 >60 mL/min   Anion gap 13 5 - 15  Rapid urine drug screen (hospital performed)  Result Value Ref Range   Opiates NONE DETECTED NONE DETECTED   Cocaine NONE DETECTED NONE DETECTED   Benzodiazepines POSITIVE (A) NONE DETECTED   Amphetamines NONE DETECTED NONE DETECTED   Tetrahydrocannabinol NONE DETECTED NONE DETECTED   Barbiturates NONE DETECTED NONE DETECTED  Ethanol  Result Value Ref Range   Alcohol, Ethyl (B) 317 (HH) <10 mg/dL   No results found.    Franchot Heidelberg, PA-C 02/04/19 5400   1155: informed by RN that pt reported acute onset CP.  I evaluated the patient.  He reports he developed severe left-sided chest pain which is radiating to the left side of his chest.  It is constant, although improving slightly at this  time.  Nothing makes it worse including inspiration or movement.  He reports mild nausea, no vomiting.  He reports a history of CHF, but no other cardiac problems.  He reports a family history of heart problems, stating his parents, brothers, and uncles have all had heart attacks, but he is not sure what age.  He has a history of hypertension.  He smokes cigarettes. Heart score 4 per heart pathway. Will order repeat labs, ekg, and cxr. Of note, pts trop is chronically elevated, we do not have a baseline for high sensitivity trop.   EKG unchanged from previous.  Labs overall reassuring, hemoglobin, electrolytes, and creatinine stable.  Initial Trope 10.  Will order repeat troponin reassess.  Chest x-ray viewed interpreted by me, no pneumonia, pneumothorax, effusion, cardiomegaly.  Patient reports pain is improving.  Repeat troponin 9. As both are less than 18 and less than a 5 point change and pain has resolved, I believe he can follow up outpatient. Pain was likely 2/2 anxiety due to withdrawal sxs, as it improved with ativan.  At this time, pt is medically cleared  for [facility] treatment.      Franchot Heidelberg, PA-C 02/04/19 1547    Noemi Chapel, MD 02/07/19 1616

## 2019-02-05 ENCOUNTER — Encounter (HOSPITAL_COMMUNITY): Payer: Self-pay

## 2019-02-05 ENCOUNTER — Other Ambulatory Visit: Payer: Self-pay

## 2019-02-05 LAB — LIPID PANEL
Cholesterol: 213 mg/dL — ABNORMAL HIGH (ref 0–200)
HDL: 119 mg/dL (ref >40)
LDL Cholesterol: 78 mg/dL (ref 0–99)
Total CHOL/HDL Ratio: 1.8 RATIO
Triglycerides: 80 mg/dL (ref <150)
VLDL: 16 mg/dL (ref 0–40)

## 2019-02-05 LAB — HEMOGLOBIN A1C
Hgb A1c MFr Bld: 5.7 % — ABNORMAL HIGH (ref 4.8–5.6)
Mean Plasma Glucose: 116.89 mg/dL

## 2019-02-05 LAB — TSH: TSH: 1.374 u[IU]/mL (ref 0.350–4.500)

## 2019-02-05 MED ORDER — NICOTINE 21 MG/24HR TD PT24
21.0000 mg | MEDICATED_PATCH | Freq: Every day | TRANSDERMAL | Status: DC
  Administered 2019-02-05 – 2019-02-08 (×4): 21 mg via TRANSDERMAL
  Filled 2019-02-05 (×5): qty 1

## 2019-02-05 MED ORDER — CHLORDIAZEPOXIDE HCL 25 MG PO CAPS
25.0000 mg | ORAL_CAPSULE | Freq: Three times a day (TID) | ORAL | Status: AC
  Administered 2019-02-05 – 2019-02-06 (×4): 25 mg via ORAL
  Filled 2019-02-05 (×4): qty 1

## 2019-02-05 MED ORDER — TOPIRAMATE 100 MG PO TABS
50.0000 mg | ORAL_TABLET | Freq: Three times a day (TID) | ORAL | Status: DC
  Administered 2019-02-05 – 2019-02-08 (×10): 50 mg via ORAL
  Filled 2019-02-05 (×13): qty 2

## 2019-02-05 MED ORDER — FLUOXETINE HCL 20 MG PO CAPS
20.0000 mg | ORAL_CAPSULE | Freq: Every day | ORAL | Status: DC
  Administered 2019-02-05 – 2019-02-08 (×4): 20 mg via ORAL
  Filled 2019-02-05 (×5): qty 1

## 2019-02-05 MED ORDER — ENSURE ENLIVE PO LIQD
237.0000 mL | Freq: Two times a day (BID) | ORAL | Status: DC
  Administered 2019-02-05 – 2019-02-08 (×5): 237 mL via ORAL
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
## 2019-02-05 NOTE — Progress Notes (Signed)
Patient was very restless through the night and he believes it was the trazodone.
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
## 2019-02-05 NOTE — Plan of Care (Signed)
  Problem: Education: Goal: Knowledge of [facility] Education information/materials will improve Outcome: Progressing   

## 2019-02-05 NOTE — Progress Notes (Signed)
[place] NOVEL CORONAVIRUS (COVID-19) DAILY CHECK-OFF SYMPTOMS - answer yes or no to each - every day NO YES  Have you had a fever in the past 24 hours?  . Fever (Temp > 37.80C / 100F) X   Have you had any of these symptoms in the past 24 hours? . New Cough .  Sore Throat  .  Shortness of Breath .  Difficulty Breathing .  Unexplained Body Aches   X   Have you had any one of these symptoms in the past 24 hours not related to allergies?   . Runny Nose .  Nasal Congestion .  Sneezing   X   If you have had runny nose, nasal congestion, sneezing in the past 24 hours, has it worsened?  X   EXPOSURES - check yes or no X   Have you traveled outside the state in the past 14 days?  X   Have you been in contact with someone with a confirmed diagnosis of COVID-19 or PUI in the past 14 days without wearing appropriate PPE?  X   Have you been living in the same home as a person with confirmed diagnosis of COVID-19 or a PUI (household contact)?    X   Have you been diagnosed with COVID-19?    X              What to do next: Answered NO to all: Answered YES to anything:   Proceed with unit schedule Follow the [facility] Inpatient Flowsheet.   

## 2019-02-05 NOTE — Tx Team (Signed)
Initial Treatment Plan 02/05/2019 12:07 AM [name] PYY:[id number]    PATIENT STRESSORS: Financial difficulties Health problems Occupational concerns Substance abuse   PATIENT STRENGTHS: Communication skills Motivation for treatment/growth Supportive family/friends   PATIENT IDENTIFIED PROBLEMS: "no job"  "alcohol use"  "I want to get my mind stronger so I can get my body stronger so I can work"                 DISCHARGE CRITERIA:  Improved stabilization in mood, thinking, and/or behavior Need for constant or close observation no longer present  PRELIMINARY DISCHARGE PLAN: Placement in alternative living arrangements  PATIENT/FAMILY INVOLVEMENT: This treatment plan has been presented to and reviewed with the patient, Tzion Wedel.  The patient and family have been given the opportunity to ask questions and make suggestions.  Noel Christmas, RN 02/05/2019, 12:07 AM

## 2019-02-05 NOTE — Care Management (Signed)
CMA sent referral to [facility]. CMA will follow up.      Vladimir Lenhoff Care Management Assistant  Email:[email or web address] Office: [phone]

## 2019-02-05 NOTE — Progress Notes (Signed)
Recreation Therapy Notes  Date:  7.17.20 Time: 0930 Location: 300 Hall Dayroom  Group Topic: Stress Management  Goal Area(s) Addresses:  Patient will identify positive stress management techniques. Patient will identify benefits of using stress management post d/c.  Intervention: Stress Management  Activity : Guided Imagery.  LRT introduced the stress management technique of guided imagery.  LRT read a script that focused on imagining your peaceful place.  Patients were to listen and follow along as script was read to participate in activity.  Education:  Stress Management, Discharge Planning.   Education Outcome: Acknowledges Education  Clinical Observations/Feedback: Pt did not attend group.     Victorino Sparrow, LRT/CTRS         Victorino Sparrow A 02/05/2019 10:43 AM

## 2019-02-05 NOTE — Progress Notes (Signed)
D: Pt was in his room upon initial approach.  Pt presents with anxious, depressed affect and mood.  He states "I'm feeling pretty good."  His goal is to "watch some TV, eat a snack, sleep good, be safe."  Pt denies SI/HI, denies hallucinations, denies pain.  Pt has been visible in milieu interacting with peers and staff appropriately.  Pt discussed how he hopes to get aftercare treatment at a facility in [place].  He reports he discussed this with his Education officer, museum today.   A: Introduced self to pt.  Met with pt 1:1.  Actively listened to pt and offered support and encouragement.  PRN medication administered for pain and anxiety.  15 minute safety checks in place.  R: Pt is safe on the unit.  Pt is compliant with medications.  Pt verbally contracts for safety.  Will continue to monitor and assess.

## 2019-02-05 NOTE — Progress Notes (Signed)
NUTRITION ASSESSMENT  Pt identified as at risk on the Malnutrition Screen Tool  INTERVENTION: 1. Supplements: Continue Ensure Enlive po BID, each supplement provides 350 kcal and 20 grams of protein  NUTRITION DIAGNOSIS: Unintentional weight loss related to sub-optimal intake as evidenced by pt report.   Goal: Pt to meet >/= 90% of their estimated nutrition needs.  Monitor:  PO intake  Assessment:  Pt admitted for substance abuse, pt drinks ETOH daily and UDS+ for benzos. Pt reports living in a tent in the woods with wife. Pt begins to drink in the morning. Suspect poor quality diet given living situation and degree of ETOH consumption. Ensure supplements have been ordered. Pt receiving Thiamine and daily MVI via CIWA.  Per weight records, pt has lost 27 lbs since 4/18 (12% wt loss x 3 months, significant for time frame).  Height: Ht Readings from Last 1 Encounters:  02/04/19 6\' 3"  (1.905 m)    Weight: Wt Readings from Last 1 Encounters:  02/04/19 85.3 kg    Weight Hx: Wt Readings from Last 10 Encounters:  02/04/19 85.3 kg  12/06/18 96 kg  11/22/18 102.1 kg  11/07/18 97.5 kg  11/06/18 99.7 kg    BMI:  Body mass index is 23.5 kg/m. Pt meets criteria for normal based on current BMI.  Estimated Nutritional Needs: Kcal: 25-30 kcal/kg Protein: > 1 gram protein/kg Fluid: 1 ml/kcal  Diet Order:  Diet Order            Diet regular Room service appropriate? No; Fluid consistency: Thin; Fluid restriction: 2000 mL Fluid  Diet effective now             Pt is also offered choice of unit snacks mid-morning and mid-afternoon.  Pt is eating as desired.   Lab results and medications reviewed.   Clayton Bibles, MS, RD, [facility] Dietitian Pager: [phone] After Hours Pager: [phone]

## 2019-02-05 NOTE — Progress Notes (Signed)
Patient ID: Alvin Warren, male   DOB: 12-13-1960, 58 y.o.   MRN: [id number]   CSW received application for [facility] from patient. CSW faxed the application to [facility].

## 2019-02-05 NOTE — Progress Notes (Signed)
D Pt is observed OOB UAL on the 300 hall today, he tolerates this well. He , initially, was reluctant to ask for anything, looking down, remaining very quiet about what he needed. As the day has gone on, he has appeared less quiet, less depressed and is beginning to inititate conversation with this Probation officer independently . He is wearing his own clothes, he makes good eye contact with this Probation officer and says " I'm beginning to feel better".     A HE did complete his daily assessment and on this he wrote he denied having SI today and he rated his depression, hopelessness and anxiety " 3/2/4", respectively. He says " I think Ive found a place to go;..once I get better. Pt actively working on his dc plan.     R Safety is in place,.

## 2019-02-05 NOTE — Tx Team (Signed)
Interdisciplinary Treatment and Diagnostic Plan Update  02/05/2019 Time of Session:  Alvin Warren MRN: [id number]  Principal Diagnosis: <principal problem not specified>  Secondary Diagnoses: Active Problems:   MDD (major depressive disorder)   Current Medications:  Current Facility-Administered Medications  Medication Dose Route Frequency Provider Last Rate Last Dose  . acetaminophen (TYLENOL) tablet 650 mg  650 mg Oral Q6H PRN Mordecai Maes, NP   650 mg at 02/05/19 0816  . alum & mag hydroxide-simeth (MAALOX/MYLANTA) 200-200-20 MG/5ML suspension 30 mL  30 mL Oral Q4H PRN Mordecai Maes, NP      . chlordiazePOXIDE (LIBRIUM) capsule 25 mg  25 mg Oral TID Johnn Hai, MD      . feeding supplement (ENSURE ENLIVE) (ENSURE ENLIVE) liquid 237 mL  237 mL Oral BID BM Sharma Covert, MD      . FLUoxetine (PROZAC) capsule 20 mg  20 mg Oral Daily Johnn Hai, MD      . hydrOXYzine (ATARAX/VISTARIL) tablet 25 mg  25 mg Oral Q6H PRN Mordecai Maes, NP   25 mg at 02/04/19 2201  . loperamide (IMODIUM) capsule 2-4 mg  2-4 mg Oral PRN Mordecai Maes, NP   4 mg at 02/04/19 2201  . magnesium hydroxide (MILK OF MAGNESIA) suspension 30 mL  30 mL Oral Daily PRN Mordecai Maes, NP      . multivitamin with minerals tablet 1 tablet  1 tablet Oral Daily Mordecai Maes, NP   1 tablet at 02/05/19 0814  . nicotine (NICODERM CQ - dosed in mg/24 hours) patch 21 mg  21 mg Transdermal Daily Sharma Covert, MD   21 mg at 02/05/19 0814  . ondansetron (ZOFRAN-ODT) disintegrating tablet 4 mg  4 mg Oral Q6H PRN Mordecai Maes, NP      . thiamine (VITAMIN B-1) tablet 100 mg  100 mg Oral Daily Mordecai Maes, NP   100 mg at 02/05/19 0814  . topiramate (TOPAMAX) tablet 50 mg  50 mg Oral TID Johnn Hai, MD      . traZODone (DESYREL) tablet 50 mg  50 mg Oral QHS PRN Mordecai Maes, NP   50 mg at 02/04/19 2201   PTA Medications: Medications Prior to Admission  Medication Sig Dispense Refill Last  Dose  . diphenhydrAMINE (BENADRYL) 50 MG tablet Take 0.5 tablets (25 mg total) by mouth every 6 (six) hours as needed for itching or allergies (angioedema). 30 tablet 0   . famotidine (PEPCID) 20 MG tablet Take 1 tablet (20 mg total) by mouth 2 (two) times daily. 10 tablet 0   . hydrochlorothiazide (HYDRODIURIL) 25 MG tablet Take 1 tablet (25 mg total) by mouth daily. 30 tablet 0   . nicotine (NICODERM CQ - DOSED IN MG/24 HOURS) 21 mg/24hr patch Place 1 patch (21 mg total) onto the skin daily. (Patient not taking: Reported on 02/03/2019) 28 patch 0   . predniSONE (DELTASONE) 20 MG tablet Take 2 tablets (40 mg total) by mouth daily. (Patient not taking: Reported on 02/03/2019) 10 tablet 0   . propranolol (INDERAL) 40 MG tablet Take 1 tablet (40 mg total) by mouth 2 (two) times daily. 60 tablet 2   . traZODone (DESYREL) 100 MG tablet Take 1 tablet (100 mg total) by mouth at bedtime as needed for sleep. 90 tablet 1     Patient Stressors: Financial difficulties Health problems Occupational concerns Substance abuse  Patient Strengths: Agricultural engineer for treatment/growth Supportive family/friends  Treatment Modalities: Medication Management, Group therapy, Case management,  1 to  1 session with clinician, Psychoeducation, Recreational therapy.   Physician Treatment Plan for Primary Diagnosis: <principal problem not specified> Long Term Goal(s): Improvement in symptoms so as ready for discharge Improvement in symptoms so as ready for discharge   Short Term Goals: Ability to disclose and discuss suicidal ideas Ability to demonstrate self-control will improve Ability to identify and develop effective coping behaviors will improve Ability to maintain clinical measurements within normal limits will improve Compliance with prescribed medications will improve Ability to disclose and discuss suicidal ideas Ability to demonstrate self-control will improve Ability to identify and  develop effective coping behaviors will improve Ability to maintain clinical measurements within normal limits will improve  Medication Management: Evaluate patient's response, side effects, and tolerance of medication regimen.  Therapeutic Interventions: 1 to 1 sessions, Unit Group sessions and Medication administration.  Evaluation of Outcomes: Not Met  Physician Treatment Plan for Secondary Diagnosis: Active Problems:   MDD (major depressive disorder)  Long Term Goal(s): Improvement in symptoms so as ready for discharge Improvement in symptoms so as ready for discharge   Short Term Goals: Ability to disclose and discuss suicidal ideas Ability to demonstrate self-control will improve Ability to identify and develop effective coping behaviors will improve Ability to maintain clinical measurements within normal limits will improve Compliance with prescribed medications will improve Ability to disclose and discuss suicidal ideas Ability to demonstrate self-control will improve Ability to identify and develop effective coping behaviors will improve Ability to maintain clinical measurements within normal limits will improve     Medication Management: Evaluate patient's response, side effects, and tolerance of medication regimen.  Therapeutic Interventions: 1 to 1 sessions, Unit Group sessions and Medication administration.  Evaluation of Outcomes: Not Met   RN Treatment Plan for Primary Diagnosis: <principal problem not specified> Long Term Goal(s): Knowledge of disease and therapeutic regimen to maintain health will improve  Short Term Goals: Ability to demonstrate self-control, Ability to participate in decision making will improve, Ability to verbalize feelings will improve, Ability to disclose and discuss suicidal ideas, Ability to identify and develop effective coping behaviors will improve and Compliance with prescribed medications will improve  Medication Management: RN will  administer medications as ordered by provider, will assess and evaluate patient's response and provide education to patient for prescribed medication. RN will report any adverse and/or side effects to prescribing provider.  Therapeutic Interventions: 1 on 1 counseling sessions, Psychoeducation, Medication administration, Evaluate responses to treatment, Monitor vital signs and CBGs as ordered, Perform/monitor CIWA, COWS, AIMS and Fall Risk screenings as ordered, Perform wound care treatments as ordered.  Evaluation of Outcomes: Not Met   LCSW Treatment Plan for Primary Diagnosis: <principal problem not specified> Long Term Goal(s): Safe transition to appropriate next level of care at discharge, Engage patient in therapeutic group addressing interpersonal concerns.  Short Term Goals: Engage patient in aftercare planning with referrals and resources  Therapeutic Interventions: Assess for all discharge needs, 1 to 1 time with Social worker, Explore available resources and support systems, Assess for adequacy in community support network, Educate family and significant other(s) on suicide prevention, Complete Psychosocial Assessment, Interpersonal group therapy.  Evaluation of Outcomes: Not Met   Progress in Treatment: Attending groups: No. New to unit Participating in groups: No. Taking medication as prescribed: Yes. Toleration medication: Yes. Family/Significant other contact made: No, will contact:  if patient consents to collateral contacts Patient understands diagnosis: Yes. Discussing patient identified problems/goals with staff: Yes. Medical problems stabilized or resolved: Yes. Denies suicidal/homicidal ideation: Yes.  Issues/concerns per patient self-inventory: No. Other:   New problem(s) identified: None  New Short Term/Long Term Goal(s):Detox, medication stabilization, elimination of SI thoughts, development of comprehensive mental wellness plan.    Patient Goals:     Discharge Plan or Barriers: Patient recently admitted. CSW will continue to follow and assess for appropriate referrals and possible discharge planning.    Reason for Continuation of Hospitalization: Depression Medication stabilization Suicidal ideation Withdrawal symptoms  Estimated Length of Stay: 3-5 days   Attendees: Patient: 02/05/2019 10:50 AM  Physician: Dr. Johnn Hai, MD 02/05/2019 10:50 AM  Nursing: Chong Sicilian.D, RN 02/05/2019 10:50 AM  RN Care Manager: 02/05/2019 10:50 AM  Social Worker: Radonna Ricker, [place] 02/05/2019 10:50 AM  Recreational Therapist:  02/05/2019 10:50 AM  Other:  02/05/2019 10:50 AM  Other:  02/05/2019 10:50 AM  Other: 02/05/2019 10:50 AM    Scribe for Treatment Team: Marylee Floras, [place] 02/05/2019 10:50 AM
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
## 2019-02-06 DIAGNOSIS — Z590 Homelessness: Secondary | ICD-10-CM

## 2019-02-06 DIAGNOSIS — F329 Major depressive disorder, single episode, unspecified: Principal | ICD-10-CM

## 2019-02-06 NOTE — Progress Notes (Signed)
[facility] MD Progress Note  02/06/2019 2:11 PM Alvin Warren  MRN:  [id number]   Subjective:  Alvin Warren reported " not feeling well today, because my wife was discharge to the [facility] the [facility]." as patient reported he was hopeful that he and his wife would be inpatient together.   Evaluation: [name] observed resting in bed.  He presents flat,guarded and depressed. Today patient reported that his depression is worse because his wife was discharge from [facility] and they had plans for he and  his wife to be admitted to inpatient facility for a few days. Reported symptoms of worried because he reports they are homeless. Patient was encouraged to follow-up with unit social worker for outpatient resources. Denying suicidal or homicidal ideations. Denies auditory or visual hallucinations. Patient reported history with alcohol abuse.  Denies withdrawal symptoms or alcohol cravings.  Patient reported a good appetite and stated he is resting well thought the nigh.  Support, encouragement and reassurance was provided.   Principal Problem: MDD (major depressive disorder) Diagnosis: Principal Problem:   MDD (major depressive disorder)  Total Time spent with patient: 15 minutes  Past Psychiatric History:   Past Medical History:  Past Medical History:  Diagnosis Date  . Alcohol withdrawal seizure ([place])   . Hypertension     Past Surgical History:  Procedure Laterality Date  . BACK SURGERY     Family History:  Family History  Problem Relation Age of Onset  . Heart failure Mother    Family Psychiatric  History:  Social History:  Social History   Substance and Sexual Activity  Alcohol Use Yes   Comment: last used last night     Social History   Substance and Sexual Activity  Drug Use Not Currently  . Types: Cocaine   Comment: last used 2 months ago    Social History   Socioeconomic History  . Marital status: Married    Spouse name: Not on file  . Number of children: Not on file  . Years  of education: Not on file  . Highest education level: Not on file  Occupational History  . Not on file  Social Needs  . Financial resource strain: Not on file  . Food insecurity    Worry: Not on file    Inability: Not on file  . Transportation needs    Medical: Not on file    Non-medical: Not on file  Tobacco Use  . Smoking status: Current Every Day Smoker    Packs/day: 0.50    Types: Cigarettes  . Smokeless tobacco: Never Used  Substance and Sexual Activity  . Alcohol use: Yes    Comment: last used last night  . Drug use: Not Currently    Types: Cocaine    Comment: last used 2 months ago  . Sexual activity: Yes    Birth control/protection: Condom  Lifestyle  . Physical activity    Days per week: Not on file    Minutes per session: Not on file  . Stress: Not on file  Relationships  . Social Herbalist on phone: Not on file    Gets together: Not on file    Attends religious service: Not on file    Active member of club or organization: Not on file    Attends meetings of clubs or organizations: Not on file    Relationship status: Not on file  Other Topics Concern  . Not on file  Social History Narrative  .  Not on file   Additional Social History:                         Sleep: Fair  Appetite:  Fair  Current Medications: Current Facility-Administered Medications  Medication Dose Route Frequency Provider Last Rate Last Dose  . acetaminophen (TYLENOL) tablet 650 mg  650 mg Oral Q6H PRN Mordecai Maes, NP   650 mg at 02/05/19 2109  . alum & mag hydroxide-simeth (MAALOX/MYLANTA) 200-200-20 MG/5ML suspension 30 mL  30 mL Oral Q4H PRN Mordecai Maes, NP      . chlordiazePOXIDE (LIBRIUM) capsule 25 mg  25 mg Oral TID Johnn Hai, MD   25 mg at 02/06/19 1240  . feeding supplement (ENSURE ENLIVE) (ENSURE ENLIVE) liquid 237 mL  237 mL Oral BID BM Sharma Covert, MD   237 mL at 02/06/19 0920  . FLUoxetine (PROZAC) capsule 20 mg  20 mg Oral Daily  Johnn Hai, MD   20 mg at 02/06/19 0843  . hydrOXYzine (ATARAX/VISTARIL) tablet 25 mg  25 mg Oral Q6H PRN Mordecai Maes, NP   25 mg at 02/05/19 2109  . loperamide (IMODIUM) capsule 2-4 mg  2-4 mg Oral PRN Mordecai Maes, NP   4 mg at 02/04/19 2201  . magnesium hydroxide (MILK OF MAGNESIA) suspension 30 mL  30 mL Oral Daily PRN Mordecai Maes, NP      . multivitamin with minerals tablet 1 tablet  1 tablet Oral Daily Mordecai Maes, NP   1 tablet at 02/06/19 0843  . nicotine (NICODERM CQ - dosed in mg/24 hours) patch 21 mg  21 mg Transdermal Daily Sharma Covert, MD   21 mg at 02/06/19 0920  . ondansetron (ZOFRAN-ODT) disintegrating tablet 4 mg  4 mg Oral Q6H PRN Mordecai Maes, NP      . thiamine (VITAMIN B-1) tablet 100 mg  100 mg Oral Daily Mordecai Maes, NP   100 mg at 02/06/19 0843  . topiramate (TOPAMAX) tablet 50 mg  50 mg Oral TID Johnn Hai, MD   50 mg at 02/06/19 1240  . traZODone (DESYREL) tablet 50 mg  50 mg Oral QHS PRN Mordecai Maes, NP   50 mg at 02/04/19 2201    Lab Results:  Results for orders placed or performed during the hospital encounter of 02/04/19 (from the past 48 hour(s))  Hemoglobin A1c     Status: Abnormal   Collection Time: 02/05/19  6:37 AM  Result Value Ref Range   Hgb A1c MFr Bld 5.7 (H) 4.8 - 5.6 %    Comment: (NOTE) Pre diabetes:          5.7%-6.4% Diabetes:              >6.4% Glycemic control for   <7.0% adults with diabetes    Mean Plasma Glucose 116.89 mg/dL    Comment: Performed at [facility], [place] [address], [place], [place] [address]  Lipid panel     Status: Abnormal   Collection Time: 02/05/19  6:37 AM  Result Value Ref Range   Cholesterol 213 (H) 0 - 200 mg/dL   Triglycerides 80 <150 mg/dL   HDL 119 >40 mg/dL   Total CHOL/HDL Ratio 1.8 RATIO   VLDL 16 0 - 40 mg/dL   LDL Cholesterol 78 0 - 99 mg/dL    Comment:        Total Cholesterol/HDL:CHD Risk Coronary Heart Disease Risk Table  Men    Women  1/2 Average Risk   3.4   3.3  Average Risk       5.0   4.4  2 X Average Risk   9.6   7.1  3 X Average Risk  23.4   11.0        Use the calculated Patient Ratio above and the CHD Risk Table to determine the patient's CHD Risk.        ATP III CLASSIFICATION (LDL):  <100     mg/dL   Optimal  100-129  mg/dL   Near or Above                    Optimal  130-159  mg/dL   Borderline  160-189  mg/dL   High  >190     mg/dL   Very High Performed at [facility] [address] [place], [place] [address]   TSH     Status: None   Collection Time: 02/05/19  6:37 AM  Result Value Ref Range   TSH 1.374 0.350 - 4.500 uIU/mL    Comment: Performed by a 3rd Generation assay with a functional sensitivity of <=0.01 uIU/mL. Performed at [facility], [place] [address] [place], [place] [address]     Blood Alcohol level:  Lab Results  Component Value Date   ETH [address] [facility]) 02/03/2019   ETH 70 (H) 89/21/1939    Metabolic Disorder Labs: Lab Results  Component Value Date   HGBA1C 5.7 (H) 02/05/2019   MPG 116.89 02/05/2019   No results found for: PROLACTIN Lab Results  Component Value Date   CHOL 213 (H) 02/05/2019   TRIG 80 02/05/2019   HDL 119 02/05/2019   CHOLHDL 1.8 02/05/2019   VLDL 16 02/05/2019   LDLCALC 78 02/05/2019    Physical Findings: AIMS: Facial and Oral Movements Muscles of Facial Expression: None, normal Lips and Perioral Area: None, normal Jaw: None, normal Tongue: None, normal,Extremity Movements Upper (arms, wrists, hands, fingers): None, normal Lower (legs, knees, ankles, toes): None, normal, Trunk Movements Neck, shoulders, hips: None, normal, Overall Severity Severity of abnormal movements (highest score from questions above): None, normal Incapacitation due to abnormal movements: None, normal Patient's awareness of abnormal movements (rate only patient's report): No Awareness, Dental Status Current problems with teeth  and/or dentures?: No Does patient usually wear dentures?: Yes  CIWA:  CIWA-Ar Total: 2 COWS:     Musculoskeletal: Strength & Muscle Tone: within normal limits Gait & Station: normal Patient leans: N/A  Psychiatric Specialty Exam: Physical Exam  Vitals reviewed. Constitutional: He appears well-developed.  Psychiatric: He has a normal mood and affect. His behavior is normal.    Review of Systems  Psychiatric/Behavioral: Positive for depression. Negative for suicidal ideas. The patient is nervous/anxious and has insomnia.   All other systems reviewed and are negative.   Blood pressure 118/80, pulse 77, temperature 98.5 F (36.9 C), resp. rate 16, height 6\' 3"  (1.905 m), weight 85.3 kg, SpO2 97 %.Body mass index is 23.5 kg/m.  General Appearance: Casual  Eye Contact:  Fair  Speech:  Clear and Coherent  Volume:  Normal  Mood:  Anxious  Affect:  Congruent and Depressed  Thought Process:  Coherent  Orientation:  Full (Time, Place, and Person)  Thought Content:  Logical and Hallucinations: None  Suicidal Thoughts:  No  Homicidal Thoughts:  No  Memory:  Immediate;   Fair Recent;   Fair  Judgement:  Fair  Insight:  Fair  Psychomotor Activity:  Normal  Concentration:  Concentration: Fair  Recall:  [employer] of Knowledge:  Fair  Language:  Fair  Akathisia:  No  Handed:  Right  AIMS (if indicated):     Assets:  Communication Skills Desire for Improvement Resilience Social Support  ADL's:  Intact  Cognition:  WNL  Sleep:  Number of Hours: 6     Treatment Plan Summary: Daily contact with patient to assess and evaluate symptoms and progress in treatment and Medication management   Continue her current treatment plan on 02/06/2019 as listed below except were noted  Major depressive disorder:  Continue Prozac 20 mg p.o. daily Continue Topamax 50 mg p.o. 3 times daily Continue trazodone 50 mg p.o. nightly  CSW to continue working on discharge disposition  patient  encouraged to participate within the therapeutic milieu   Derrill [place], NP 02/06/2019, 2:11 PM

## 2019-02-06 NOTE — Plan of Care (Signed)
  Problem: Education: Goal: Knowledge of [place] [place] [facility] Education information/materials will improve Outcome: Progressing Goal: Emotional status will improve Outcome: Progressing   
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
## 2019-02-06 NOTE — Progress Notes (Signed)
D: Pt was at nurse's station upon initial approach.  Pt presents with anxious, depressed affect and mood.  Pt reports he "got some bad news" today.  He states "they released my wife today, she has been calling back and forth, they sent her to [employer], she's going to have to be evaluated Monday so my stress level has been way up."  Pt did report that he is trying to focus on his treatment and the fact that he has a treatment facility to go to in [place] on Monday (per pt).  He reports he "went to groups and I shared and that helped."  Pt denies SI/HI, denies hallucinations, reports abdominal pain of 7/10.  Pt has been visible in milieu interacting with peers and staff appropriately.   A: Met with pt 1:1.  Actively listened to pt and provided support and encouragement. PRN medication administered for anxiety and pain.  15 minute safety checks in place.  R: Pt is safe on the unit.  Pt is compliant with medications.  Pt verbally contracts for safety.  Will continue to monitor and assess.

## 2019-02-06 NOTE — [facility] Group Notes (Signed)
[facility] Group Notes:  (Nursing/MHT/Case Management/Adjunct)  Date:  02/06/2019  Time:  1:15 PM  Type of Therapy:  Nurse Education  Participation Level:  Active  Participation Quality:  Appropriate and Attentive  Affect:  Appropriate  Cognitive:  Alert and Appropriate  Insight:  Appropriate  Engagement in Group:  Engaged and Improving  Modes of Intervention:  Discussion, Education and Exploration  Summary of Progress/Problems:  pt's were instructed to identify needs and then discuss how these needs could be met/obtained  Baron Sane 02/06/2019, 3:13 PM

## 2019-02-06 NOTE — Plan of Care (Signed)
  Problem: Education: Goal: Emotional status will improve Outcome: Progressing   

## 2019-02-06 NOTE — Progress Notes (Signed)
D Pt is observed standing at the med window this am. He wears his own clothes. He is slow to speak. He hesitates when this Probation officer asks him how he is doing today. He says " I don't know.Marland Kitchenibuprofen think I might be sliding a little backwards.Marland Kitchenibuprofen guess I'm upset because I got some bad news.Marland Kitchenibuprofen found out my wife is going to be released from where she is today and I cna't take care of her". He looks down, as if he might cry and his eyes well up with tears. He shakes his head side to side and says " I just can't save her..I can barely help myself".     A HE completed his daily assessment and on this he wrote he denied having SI today and he rated his depression, hopelessness and anxiety " 5/2/2/", respectively. Writer and he spoke about ways he can stay focused on htings he can control today.     R Safety in place.

## 2019-02-06 NOTE — Progress Notes (Signed)

## 2019-02-07 ENCOUNTER — Other Ambulatory Visit: Payer: Self-pay

## 2019-02-07 MED ORDER — ASPIRIN 81 MG PO CHEW
81.0000 mg | CHEWABLE_TABLET | Freq: Once | ORAL | Status: AC
  Administered 2019-02-07: 81 mg via ORAL

## 2019-02-07 MED ORDER — ASPIRIN EC 81 MG PO TBEC
DELAYED_RELEASE_TABLET | ORAL | Status: AC
  Filled 2019-02-07: qty 1

## 2019-02-07 NOTE — Progress Notes (Signed)
[employer] NOVEL CORONAVIRUS (COVID-19) DAILY CHECK-OFF SYMPTOMS - answer yes or no to each - every day NO YES  Have you had a fever in the past 24 hours?  . Fever (Temp > 37.80C / 100F) X   Have you had any of these symptoms in the past 24 hours? . New Cough .  Sore Throat  .  Shortness of Breath .  Difficulty Breathing .  Unexplained Body Aches   X   Have you had any one of these symptoms in the past 24 hours not related to allergies?   . Runny Nose .  Nasal Congestion .  Sneezing   X   If you have had runny nose, nasal congestion, sneezing in the past 24 hours, has it worsened?  X   EXPOSURES - check yes or no X   Have you traveled outside the state in the past 14 days?  X   Have you been in contact with someone with a confirmed diagnosis of COVID-19 or PUI in the past 14 days without wearing appropriate PPE?  X   Have you been living in the same home as a person with confirmed diagnosis of COVID-19 or a PUI (household contact)?    X   Have you been diagnosed with COVID-19?    X              What to do next: Answered NO to all: Answered YES to anything:   Proceed with unit schedule Follow the [facility] Inpatient Flowsheet.   

## 2019-02-07 NOTE — Progress Notes (Signed)
[facility] Group Notes:  (Nursing/MHT/Case Management/Adjunct)  Date:  02/07/2019  Time:  2030  Type of Therapy:  wrap up group  Participation Level:  Active  Participation Quality:  Appropriate, Attentive, Sharing and Supportive  Affect:  Anxious and Tearful  Cognitive:  Appropriate  Insight:  Improving  Engagement in Group:  Engaged  Modes of Intervention:  Clarification, Education and Support  Summary of Progress/Problems: Pt shared that he feels better mentally and think the medication is working. Pt is interested in services of [employer] and would like to speak with the Education officer, museum. Pts previous plans for after discharge fell through bc of a three month wait list. Pt plans on staying sober after discharge. Pt is grateful for a chance.   Shellia Cleverly 02/07/2019, 10:57 PM

## 2019-02-07 NOTE — Progress Notes (Signed)
[facility] MD Progress Note  02/07/2019 7:47 AM Alvin Warren  MRN:  [id number]   Subjective:  Alvin Warren reported "   I am feeling better today, I was able to speak with my wife on last night and she reports she was doing fine."   Evaluation: Patient observed standing at the nurses station awaiting medication administration.  He presents with a brighter affect than on yesterday.  Denying suicidal or homicidal ideations.  Denies auditory or visual hallucinations.  Patient reports he is in" better spirits" as he was able to communication with his wife on yesterday.  He reports he is hopeful to be discharged to [facility] in [place] as he states that he has completed an application for admission.  However has plans to attend [employer] if his application is denied.  Rates his depression 3 out of 10 with 10 being the worst.  Reports some anxiety related to worrying about his wife.  As he reports chest pain that is intermittent. Denied pain radation. Shaheem reported he feels his symptoms are related to stress.  EKG orders placed- pending resutls.  Attending psychiatrist reviewed.  Blood pressure 113/78 HR 107  81 mg aspirin provided.  Patient denies shortness of breath, nausea or headaches.  We will continue to monitor.  Patient to follow-up with medical clearance RN made aware to discontinue order.  Support, encouragement  And reassurance was provided   Principal Problem: MDD (major depressive disorder) Diagnosis: Principal Problem:   MDD (major depressive disorder)  Total Time spent with patient: 15 minutes  Past Psychiatric History:   Past Medical History:  Past Medical History:  Diagnosis Date  . Alcohol withdrawal seizure ([place])   . Hypertension     Past Surgical History:  Procedure Laterality Date  . BACK SURGERY     Family History:  Family History  Problem Relation Age of Onset  . Heart failure Mother    Family Psychiatric  History:  Social History:  Social History   Substance and  Sexual Activity  Alcohol Use Yes   Comment: last used last night     Social History   Substance and Sexual Activity  Drug Use Not Currently  . Types: Cocaine   Comment: last used 2 months ago    Social History   Socioeconomic History  . Marital status: Married    Spouse name: Not on file  . Number of children: Not on file  . Years of education: Not on file  . Highest education level: Not on file  Occupational History  . Not on file  Social Needs  . Financial resource strain: Not on file  . Food insecurity    Worry: Not on file    Inability: Not on file  . Transportation needs    Medical: Not on file    Non-medical: Not on file  Tobacco Use  . Smoking status: Current Every Day Smoker    Packs/day: 0.50    Types: Cigarettes  . Smokeless tobacco: Never Used  Substance and Sexual Activity  . Alcohol use: Yes    Comment: last used last night  . Drug use: Not Currently    Types: Cocaine    Comment: last used 2 months ago  . Sexual activity: Yes    Birth control/protection: Condom  Lifestyle  . Physical activity    Days per week: Not on file    Minutes per session: Not on file  . Stress: Not on file  Relationships  . Social connections  Talks on phone: Not on file    Gets together: Not on file    Attends religious service: Not on file    Active member of club or organization: Not on file    Attends meetings of clubs or organizations: Not on file    Relationship status: Not on file  Other Topics Concern  . Not on file  Social History Narrative  . Not on file   Additional Social History:                         Sleep: Fair  Appetite:  Fair  Current Medications: Current Facility-Administered Medications  Medication Dose Route Frequency Provider Last Rate Last Dose  . acetaminophen (TYLENOL) tablet 650 mg  650 mg Oral Q6H PRN Mordecai Maes, NP   650 mg at 02/06/19 2005  . alum & mag hydroxide-simeth (MAALOX/MYLANTA) 200-200-20 MG/5ML  suspension 30 mL  30 mL Oral Q4H PRN Mordecai Maes, NP      . feeding supplement (ENSURE ENLIVE) (ENSURE ENLIVE) liquid 237 mL  237 mL Oral BID BM Sharma Covert, MD   237 mL at 02/06/19 0920  . FLUoxetine (PROZAC) capsule 20 mg  20 mg Oral Daily Johnn Hai, MD   20 mg at 02/07/19 0744  . hydrOXYzine (ATARAX/VISTARIL) tablet 25 mg  25 mg Oral Q6H PRN Mordecai Maes, NP   25 mg at 02/06/19 2005  . loperamide (IMODIUM) capsule 2-4 mg  2-4 mg Oral PRN Mordecai Maes, NP   4 mg at 02/04/19 2201  . magnesium hydroxide (MILK OF MAGNESIA) suspension 30 mL  30 mL Oral Daily PRN Mordecai Maes, NP      . multivitamin with minerals tablet 1 tablet  1 tablet Oral Daily Mordecai Maes, NP   1 tablet at 02/07/19 0744  . nicotine (NICODERM CQ - dosed in mg/24 hours) patch 21 mg  21 mg Transdermal Daily Sharma Covert, MD   21 mg at 02/07/19 0747  . ondansetron (ZOFRAN-ODT) disintegrating tablet 4 mg  4 mg Oral Q6H PRN Mordecai Maes, NP      . thiamine (VITAMIN B-1) tablet 100 mg  100 mg Oral Daily Mordecai Maes, NP   100 mg at 02/07/19 0744  . topiramate (TOPAMAX) tablet 50 mg  50 mg Oral TID Johnn Hai, MD   50 mg at 02/07/19 0744  . traZODone (DESYREL) tablet 50 mg  50 mg Oral QHS PRN Mordecai Maes, NP   50 mg at 02/04/19 2201    Lab Results:  No results found for this or any previous visit (from the past 48 hour(s)).  Blood Alcohol level:  Lab Results  Component Value Date   ETH 317 (HH) 02/03/2019   ETH 70 (H) 74/02/1447    Metabolic Disorder Labs: Lab Results  Component Value Date   HGBA1C 5.7 (H) 02/05/2019   MPG 116.89 02/05/2019   No results found for: PROLACTIN Lab Results  Component Value Date   CHOL 213 (H) 02/05/2019   TRIG 80 02/05/2019   HDL 119 02/05/2019   CHOLHDL 1.8 02/05/2019   VLDL 16 02/05/2019   LDLCALC 78 02/05/2019    Physical Findings: AIMS: Facial and Oral Movements Muscles of Facial Expression: None, normal Lips and Perioral  Area: None, normal Jaw: None, normal Tongue: None, normal,Extremity Movements Upper (arms, wrists, hands, fingers): None, normal Lower (legs, knees, ankles, toes): None, normal, Trunk Movements Neck, shoulders, hips: None, normal, Overall Severity Severity of abnormal movements (highest  score from questions above): None, normal Incapacitation due to abnormal movements: None, normal Patient's awareness of abnormal movements (rate only patient's report): No Awareness, Dental Status Current problems with teeth and/or dentures?: No Does patient usually wear dentures?: Yes  CIWA:  CIWA-Ar Total: 2 COWS:     Musculoskeletal: Strength & Muscle Tone: within normal limits Gait & Station: normal Patient leans: N/A  Psychiatric Specialty Exam: Physical Exam  Vitals reviewed. Constitutional: He appears well-developed.  Psychiatric: He has a normal mood and affect. His behavior is normal.    Review of Systems  Psychiatric/Behavioral: Positive for depression. Negative for suicidal ideas. The patient is nervous/anxious and has insomnia.   All other systems reviewed and are negative.   Blood pressure 98/79, pulse (!) 107, temperature 98.2 F (36.8 C), temperature source Oral, resp. rate 16, height 6\' 3"  (1.905 m), weight 85.3 kg, SpO2 99 %.Body mass index is 23.5 kg/m.  General Appearance: Casual  Eye Contact:  Fair  Speech:  Clear and Coherent  Volume:  Normal  Mood:  Anxious  Affect:  Congruent and Depressed  Thought Process:  Coherent  Orientation:  Full (Time, Place, and Person)  Thought Content:  Logical and Hallucinations: None  Suicidal Thoughts:  No  Homicidal Thoughts:  No  Memory:  Immediate;   Fair Recent;   Fair  Judgement:  Fair  Insight:  Fair  Psychomotor Activity:  Normal  Concentration:  Concentration: Fair  Recall:  [employer] of Knowledge:  Fair  Language:  Fair  Akathisia:  No  Handed:  Right  AIMS (if indicated):     Assets:  Communication Skills Desire  for Improvement Resilience Social Support  ADL's:  Intact  Cognition:  WNL  Sleep:  Number of Hours: 6.5     Treatment Plan Summary: Daily contact with patient to assess and evaluate symptoms and progress in treatment and Medication management   Continue her current treatment plan on 02/07/2019 as listed below except were noted  Major depressive disorder:  Continue Prozac 20 mg p.o. daily Continue Topamax 50 mg p.o. 3 times daily Continue trazodone 50 mg p.o. nightly  CSW to continue working on discharge disposition  patient encouraged to participate within the therapeutic milieu   Derrill [place], NP 02/07/2019, 7:47 AM

## 2019-02-07 NOTE — Progress Notes (Signed)
Pt is resting at this time in his bed with even respiration and  Unlabored, will continue to monitor.

## 2019-02-07 NOTE — BHH Group Notes (Signed)
Life SKills   Date:  02/07/2019  Time:  1300  Type of Therapy:  Nurse Education  :  The group focuses on teaching patients how to identify unhealthy coping skills and the develop and practice newer and healtheir  ways to get their needs met.  Participation Level:  Active  Participation Quality:  Attentive  Affect:  Appropriate  Cognitive:  Alert  Insight:  Good  Engagement in Group:  Engaged  Modes of Intervention:  Education  Summary of Progress/Problems:  Alvin Warren 02/07/2019, 4:27 PM

## 2019-02-07 NOTE — BHH Suicide Risk Assessment (Signed)
[place] INPATIENT:  Family/Significant Other Suicide Prevention Education  Suicide Prevention Education:  Education Completed; Alvin Warren, mother, [phone]  (name of family member/significant other) has been identified by the patient as the family member/significant other with whom the patient will be residing, and identified as the person(s) who will aid the patient in the event of a mental health crisis (suicidal ideations/suicide attempt).  With written consent from the patient, the family member/significant other has been provided the following suicide prevention education, prior to the and/or following the discharge of the patient.  Mother is concerned that if patient does not get into a program, he will be unable to stay off alcohol and will remain suicidal.  He cannot come stay with her because she lives in a senior community.  She states that going to a shelter would be a very poor option for him.  The suicide prevention education provided includes the following:  Suicide risk factors  Suicide prevention and interventions  National Suicide Hotline telephone number  [facility] assessment telephone number  [facility] Emergency Assistance [place] and/or Residential Mobile Crisis Unit telephone number  Request made of family/significant other to:  Remove weapons (e.g., guns, rifles, knives), all items previously/currently identified as safety concern.    Remove drugs/medications (over-the-counter, prescriptions, illicit drugs), all items previously/currently identified as a safety concern.  The family member/significant other verbalizes understanding of the suicide prevention education information provided.  The family member/significant other agrees to remove the items of safety concern listed above.  Alvin Warren 02/07/2019, 4:22 PM

## 2019-02-07 NOTE — BHH Group Notes (Signed)
[place] LCSW Group Therapy Note  Date/Time:  02/07/2019 9:00-10:00 or 10:00-11:00AM  Type of Therapy and Topic:  Group Therapy:  Healthy and Unhealthy Supports  Participation Level:  Did Not Attend   Description of Group:  Patients in this group were introduced to the idea of adding a variety of healthy supports to address the various needs in their lives.Patients discussed what additional healthy supports could be helpful in their recovery and wellness after discharge in order to prevent future hospitalizations.   An emphasis was placed on using counselor, doctor, therapy groups, 12-step groups, and problem-specific support groups to expand supports.  They also worked as a group on developing a specific plan for several patients to deal with unhealthy supports through [place], psychoeducation with loved ones, and even termination of relationships.   Therapeutic Goals:   1)  discuss importance of adding supports to stay well once out of the hospital  2)  compare healthy versus unhealthy supports and identify some examples of each  3)  generate ideas and descriptions of healthy supports that can be added  4)  offer mutual support about how to address unhealthy supports  5)  encourage active participation in and adherence to discharge plan    Summary of Patient Progress:  The patient  Did not attend  Therapeutic Modalities:   Motivational Interviewing Brief Solution-Focused Therapy  Rolanda Jay

## 2019-02-07 NOTE — Plan of Care (Signed)
  Problem: Education: Goal: Verbalization of understanding the information provided will improve Outcome: Progressing   

## 2019-02-07 NOTE — Progress Notes (Signed)
D Pt is observed standing at the 300 hall medication window today- he remains sad, depressed and he endorses a flat affect. He says " Donnald Garre been on hte phone with my wife..Internal Medicine trying not to worry about her- about where she's going to go and if she's going to be ok- but I do...ibuprofen care about ehr and it really gets next to me that I can't take better care of her".      A HE completed his daily assessment and on this he wrote he deneid having SI today and he rated his depression, hopelessness and anxiety " 4/4/4/", respectively.  He complained of chest pain ( when he was speaking to TL NP and per her request , he took 81 mg aspirin po and he had an  ekg done- which  both NP and Dr Mallie Darting assessed immediately after it was completed . Pt reports CP decreased form "9" ( initially) down to " 4" approx 10 minutes later. He has no other symptoms he associates with his chest tightness. He says " I guess worry can really play a trick on your mind AND your body". He is encouraged to stay in the dayroom- with his peers and keep his thoughts occupied on what He can do today- right now- to help himslef.

## 2019-02-08 MED ORDER — TRAZODONE HCL 100 MG PO TABS
100.0000 mg | ORAL_TABLET | Freq: Every evening | ORAL | Status: DC | PRN
  Filled 2019-02-08: qty 7

## 2019-02-08 MED ORDER — FLUOXETINE HCL 20 MG PO CAPS: 20.0000 mg | ORAL_CAPSULE | Freq: Every day | ORAL | 2 refills | Status: DC

## 2019-02-08 MED ORDER — TOPIRAMATE 50 MG PO TABS: 50.0000 mg | ORAL_TABLET | Freq: Three times a day (TID) | ORAL | 1 refills | Status: DC

## 2019-02-08 NOTE — Discharge Summary (Signed)
Physician Discharge Summary Note  Patient:  Alvin Warren is an 58 y.o., male MRN:  [id number] DOB:  [birth year] Patient phone:  [phone] (home)  Patient address:   [place] [address],  Total Time spent with patient: 45 minutes  Date of Admission:  02/04/2019 Date of Discharge: 02/08/2019  Reason for Admission:    Alvin Warren is known to the service last here in April, with a very similar presentation, once again initially presenting with chest pain and then once medically cleared reporting he was suicidal.  On this admission he acknowledged that he [place], he reported he wanted detox he also reported chest pain but again that was thought to be, if anything, simply reflux.  At any rate he presented with a blood alcohol level of 317 and a drug screen positive for benzodiazepines but stating he had not taken any. It is difficult to pin down his exact alcohol intake he tells me he is drinking every day but simply cannot recall for how many days but the bottom line is he has received some Ativan for detox measures to be on the safe side his blood pressure is up slightly but his pulse has been normal. He began having suicidal thoughts again stating he did not want live like this thought about walking into traffic so forth.  Again his presentation is influenced by secondary gain issues.  He would like some longer-term rehab of course and housing. He understands what it is to contract for safety and can contract here. Reports possible history of seizures when coming off of alcohol   Principal Problem: MDD (major depressive disorder) Discharge Diagnoses: Principal Problem:   MDD (major depressive disorder)   Past Psychiatric History: see eval  Past Medical History:  Past Medical History:  Diagnosis Date  . Alcohol withdrawal seizure ([place])   . Hypertension     Past Surgical History:  Procedure Laterality Date  . BACK  SURGERY     Family History:  Family History  Problem Relation Age of Onset  . Heart failure Mother     Social History:  Social History   Substance and Sexual Activity  Alcohol Use Yes   Comment: last used last night     Social History   Substance and Sexual Activity  Drug Use Not Currently  . Types: Cocaine   Comment: last used 2 months ago    Social History   Socioeconomic History  . Marital status: Married    Spouse name: Not on file  . Number of children: Not on file  . Years of education: Not on file  . Highest education level: Not on file  Occupational History  . Not on file  Social Needs  . Financial resource strain: Not on file  . Food insecurity    Worry: Not on file    Inability: Not on file  . Transportation needs    Medical: Not on file    Non-medical: Not on file  Tobacco Use  . Smoking status: Current Every Day Smoker    Packs/day: 0.50    Types: Cigarettes  . Smokeless tobacco: Never Used  Substance and Sexual Activity  . Alcohol use: Yes    Comment: last used last night  . Drug use: Not Currently    Types: Cocaine    Comment: last used 2 months ago  . Sexual activity: Yes    Birth control/protection: Condom  Lifestyle  .  Physical activity    Days per week: Not on file    Minutes per session: Not on file  . Stress: Not on file  Relationships  . Social Herbalist on phone: Not on file    Gets together: Not on file    Attends religious service: Not on file    Active member of club or organization: Not on file    Attends meetings of clubs or organizations: Not on file    Relationship status: Not on file  Other Topics Concern  . Not on file  Social History Narrative  . Not on file    Hospital Course:    this was a repeat admission for this 58 year old homeless individual he and his wife and actually sought rehab together at any rate patient was monitored through the weekend, he displayed no dangerous behaviors, he was given  antidepressant therapy.  Withdrawal was complete.  By the date of the 20th he was alert oriented cooperative no thoughts of harming self or others and contracting fully he was still hopeful to find some type of rehab however nothing was forthcoming at this point in time particularly since his wife is already at a facility he is going to try and get in there but again there is probably a waiting list and there may be some prohibitions as far as admitting spouses to the same ward.  Any rate no thoughts of harming self or others no cravings tremors or withdrawal no acute psychosis mood improved  Physical Findings: AIMS: Facial and Oral Movements Muscles of Facial Expression: None, normal Lips and Perioral Area: None, normal Jaw: None, normal Tongue: None, normal,Extremity Movements Upper (arms, wrists, hands, fingers): None, normal Lower (legs, knees, ankles, toes): None, normal, Trunk Movements Neck, shoulders, hips: None, normal, Overall Severity Severity of abnormal movements (highest score from questions above): None, normal Incapacitation due to abnormal movements: None, normal Patient's awareness of abnormal movements (rate only patient's report): No Awareness, Dental Status Current problems with teeth and/or dentures?: No Does patient usually wear dentures?: No  CIWA:  CIWA-Ar Total: 1 COWS:     Musculoskeletal: Strength & Muscle Tone: within normal limits Gait & Station: normal Patient leans: N/A  Psychiatric Specialty Exam: ROS  Blood pressure 103/72, pulse 90, temperature 99 F (37.2 C), temperature source Oral, resp. rate 18, height 6\' 3"  (1.905 m), weight 85.3 kg, SpO2 100 %.Body mass index is 23.5 kg/m.  General Appearance: Casual  Eye Contact::  Fair  Speech:  Clear and Coherent409  Volume:  Normal  Mood:  Dysphoric  Affect:  Full Range  Thought Process:  Coherent and Descriptions of Associations: Intact  Orientation:  Full (Time, Place, and Person)  Thought Content:   Tangential  Suicidal Thoughts:  No  Homicidal Thoughts:  No  Memory:  Recent;   Fair  Judgement:  Fair  Insight:  Good  Psychomotor Activity:  Normal  Concentration:  Poor  Recall:  Good  Fund of Knowledge:Fair  Language: Fair  Akathisia:  Negative  Handed:  Right  AIMS (if indicated):     Assets:  Communication Skills Desire for Improvement  Sleep:  Number of Hours: 5.25  Cognition: WNL  ADL's:  Intact      Have you used any form of tobacco in the last 30 days? (Cigarettes, Smokeless Tobacco, Cigars, and/or Pipes): Yes  Has this patient used any form of tobacco in the last 30 days? (Cigarettes, Smokeless Tobacco, Cigars, and/or Pipes) Yes, No  Blood Alcohol level:  Lab Results  Component Value Date   ETH 317 (HH) 02/03/2019   ETH 70 (H) 05/18/2535    Metabolic Disorder Labs:  Lab Results  Component Value Date   HGBA1C 5.7 (H) 02/05/2019   MPG 116.89 02/05/2019   No results found for: PROLACTIN Lab Results  Component Value Date   CHOL 213 (H) 02/05/2019   TRIG 80 02/05/2019   HDL 119 02/05/2019   CHOLHDL 1.8 02/05/2019   VLDL 16 02/05/2019   LDLCALC 78 02/05/2019    See Psychiatric Specialty Exam and Suicide Risk Assessment completed by Attending Physician prior to discharge.  Discharge destination:  Home  Is patient on multiple antipsychotic therapies at discharge:  No   Has Patient had three or more failed trials of antipsychotic monotherapy by history: n/a Recommended Plan for Multiple Antipsychotic Therapies: NA   Allergies as of 02/08/2019      Reactions   Lisinopril    Angioedema. SHOULD NOT be on ACEi or ARB for life.      Medication List    TAKE these medications     Indication  diphenhydrAMINE 50 MG tablet Commonly known as: BENADRYL Take 0.5 tablets (25 mg total) by mouth every 6 (six) hours as needed for itching or allergies (angioedema).  Indication: Angioedema   famotidine 20 MG tablet Commonly known as: PEPCID Take 1 tablet (20  mg total) by mouth 2 (two) times daily.  Indication: Gastroesophageal Reflux Disease   FLUoxetine 20 MG capsule Commonly known as: PROZAC Take 1 capsule (20 mg total) by mouth daily. Start taking on: February 09, 2019  Indication: Depression   hydrochlorothiazide 25 MG tablet Commonly known as: HYDRODIURIL Take 1 tablet (25 mg total) by mouth daily.  Indication: Edema   nicotine 21 mg/24hr patch Commonly known as: NICODERM CQ - dosed in mg/24 hours Place 1 patch (21 mg total) onto the skin daily.  Indication: Nicotine Addiction   predniSONE 20 MG tablet Commonly known as: DELTASONE Take 2 tablets (40 mg total) by mouth daily.  Indication: Asthma   propranolol 40 MG tablet Commonly known as: INDERAL Take 1 tablet (40 mg total) by mouth 2 (two) times daily.  Indication: High Blood Pressure Disorder   topiramate 50 MG tablet Commonly known as: TOPAMAX Take 1 tablet (50 mg total) by mouth 3 (three) times daily.  Indication: Antipsychotic Therapy-Induced Weight Gain   traZODone 100 MG tablet Commonly known as: DESYREL Take 1 tablet (100 mg total) by mouth at bedtime as needed for sleep.  Indication: Abuse or Misuse of Alcohol      Follow-up [facility] Follow up.   Contact information: [place] [facility]: [phone] Fx: [phone]       [facility] Follow up.   Contact information: [address] [place] [address] [phone]           SignedJohnn Hai, MD 02/08/2019, 10:15 AM

## 2019-02-08 NOTE — Progress Notes (Signed)
  [facility] Adult Case Management Discharge Plan :  Will you be returning to the same living situation after discharge:  No. Will follow up with shelter resources.  At discharge, do you have transportation home?: Yes,  taking the bus. Do you have the ability to pay for your medications: No. Referred to [facility].   Release of information consent forms completed and in the chart; letter on chart.   Patient to Follow up at: [facility].   Why: Please call [phone] to follow up with your residential treatment referral.  Contact information: [place] Ph: [phone] Fx: [phone]       [facility].   Why: Please call [phone] to follow up with your residential treatment referral. Contact information: [address] [place] [address] [phone]        [facility] on 02/09/2019.   Why: Walk in hours Mon-Friday 8am-10am for outpatient therapy, medication management services.  Contact information: [place] [phone]           Next level of care provider has access to [place] and Suicide Prevention discussed: Yes,  with mother.  Have you used any form of tobacco in the last 30 days? (Cigarettes, Smokeless Tobacco, Cigars, and/or Pipes): Yes  Has patient been referred to the Quitline?: Patient refused referral  Patient has been referred for addiction treatment: Yes  Joellen Jersey, [place] 02/08/2019, 10:25 AM

## 2019-02-08 NOTE — BHH Suicide Risk Assessment (Signed)
[facility] Discharge Suicide Risk Assessment   Principal Problem: MDD (major depressive disorder) Discharge Diagnoses: Principal Problem:   MDD (major depressive disorder)   Total Time spent with patient: 45 minutes  Musculoskeletal: Strength & Muscle Tone: within normal limits Gait & Station: normal Patient leans: N/A  Psychiatric Specialty Exam: ROS  Blood pressure 103/72, pulse 90, temperature 99 F (37.2 C), temperature source Oral, resp. rate 18, height 6\' 3"  (1.905 m), weight 85.3 kg, SpO2 100 %.Body mass index is 23.5 kg/m.  General Appearance: Casual  Eye Contact::  Fair  Speech:  Clear and Coherent409  Volume:  Normal  Mood:  Dysphoric  Affect:  Full Range  Thought Process:  Coherent and Descriptions of Associations: Intact  Orientation:  Full (Time, Place, and Person)  Thought Content:  Tangential  Suicidal Thoughts:  No  Homicidal Thoughts:  No  Memory:  Recent;   Fair  Judgement:  Fair  Insight:  Good  Psychomotor Activity:  Normal  Concentration:  Poor  Recall:  Good  Fund of Knowledge:Fair  Language: Fair  Akathisia:  Negative  Handed:  Right  AIMS (if indicated):     Assets:  Communication Skills Desire for Improvement  Sleep:  Number of Hours: 5.25  Cognition: WNL  ADL's:  Intact   Mental Status Per Nursing Assessment::   On Admission:  Suicidal ideation indicated by patient  Demographic Factors:  Low SES Loss Factors: Decrease in vocational status  Historical Factors: NA  Risk Reduction Factors:   Sense of responsibility to family and Religious beliefs about death  Continued Clinical Symptoms:  Alcohol/Substance Abuse/Dependencies  Cognitive Features That Contribute To Risk:  Thought constriction (tunnel vision)    Suicide Risk:  Minimal: No identifiable suicidal ideation.  Patients presenting with no risk factors but with morbid ruminations; may be classified as minimal risk based on the severity of the depressive symptoms  Follow-up  [facility] Follow up.   Contact information: [place] Ph: [phone] Fx: [phone]          Plan Of Care/Follow-up recommendations:  Activity:  full  Kenitha Glendinning, MD 02/08/2019, 9:44 AM

## 2019-02-08 NOTE — Progress Notes (Signed)
Patient ID: Alvin Warren, male   DOB: January 09, 1961, 58 y.o.   MRN: [id number]   Discharge Note  Patient denies SI/HI and states readiness for discharge.  Written and verbal discharge instructions reviewed with the patient. Patient accepting to information and verbalized understanding with no concerns. All belongings returned to patient from the unit and secured lockers.  Patient was safely escorted to the lobby for discharge.

## 2019-02-12 ENCOUNTER — Other Ambulatory Visit: Payer: Self-pay

## 2019-02-12 DIAGNOSIS — Z20822 Contact with and (suspected) exposure to covid-19: Secondary | ICD-10-CM

## 2019-02-14 ENCOUNTER — Emergency Department (HOSPITAL_COMMUNITY): Payer: Self-pay

## 2019-02-14 ENCOUNTER — Emergency Department (HOSPITAL_COMMUNITY)
Admission: EM | Admit: 2019-02-14 | Discharge: 2019-02-14 | Disposition: A | Discharge status: Home / Self Care | Payer: Self-pay | Attending: Emergency Medicine | Admitting: Emergency Medicine

## 2019-02-14 ENCOUNTER — Encounter (HOSPITAL_COMMUNITY): Payer: Self-pay | Admitting: Emergency Medicine

## 2019-02-14 ENCOUNTER — Other Ambulatory Visit: Payer: Self-pay

## 2019-02-14 DIAGNOSIS — Z79899 Other long term (current) drug therapy: Secondary | ICD-10-CM | POA: Insufficient documentation

## 2019-02-14 DIAGNOSIS — F1010 Alcohol abuse, uncomplicated: Secondary | ICD-10-CM | POA: Insufficient documentation

## 2019-02-14 DIAGNOSIS — R600 Localized edema: Secondary | ICD-10-CM | POA: Insufficient documentation

## 2019-02-14 DIAGNOSIS — F333 Major depressive disorder, recurrent, severe with psychotic symptoms: Secondary | ICD-10-CM | POA: Insufficient documentation

## 2019-02-14 DIAGNOSIS — R45851 Suicidal ideations: Secondary | ICD-10-CM | POA: Insufficient documentation

## 2019-02-14 DIAGNOSIS — F17210 Nicotine dependence, cigarettes, uncomplicated: Secondary | ICD-10-CM | POA: Insufficient documentation

## 2019-02-14 DIAGNOSIS — I10 Essential (primary) hypertension: Secondary | ICD-10-CM | POA: Insufficient documentation

## 2019-02-14 DIAGNOSIS — R0789 Other chest pain: Secondary | ICD-10-CM | POA: Insufficient documentation

## 2019-02-14 DIAGNOSIS — Z03818 Encounter for observation for suspected exposure to other biological agents ruled out: Secondary | ICD-10-CM | POA: Insufficient documentation

## 2019-02-14 LAB — CBC
HCT: 40.4 % (ref 39.0–52.0)
Hemoglobin: 13.6 g/dL (ref 13.0–17.0)
MCH: 30.7 pg (ref 26.0–34.0)
MCHC: 33.7 g/dL (ref 30.0–36.0)
MCV: 91.2 fL (ref 80.0–100.0)
Platelets: 341 10*3/uL (ref 150–400)
RBC: 4.43 MIL/uL (ref 4.22–5.81)
RDW: 14 % (ref 11.5–15.5)
WBC: 4.2 10*3/uL (ref 4.0–10.5)
nRBC: 0 % (ref 0.0–0.2)

## 2019-02-14 LAB — BASIC METABOLIC PANEL
Anion gap: 12 (ref 5–15)
BUN: 7 mg/dL (ref 6–20)
CO2: 20 mmol/L — ABNORMAL LOW (ref 22–32)
Calcium: 8.4 mg/dL — ABNORMAL LOW (ref 8.9–10.3)
Chloride: 111 mmol/L (ref 98–111)
Creatinine, Ser: 0.99 mg/dL (ref 0.61–1.24)
GFR calc Af Amer: >60 mL/min (ref >60)
GFR calc non Af Amer: >60 mL/min (ref >60)
Glucose, Bld: 87 mg/dL (ref 70–99)
Potassium: 3.5 mmol/L (ref 3.5–5.1)
Sodium: 143 mmol/L (ref 135–145)

## 2019-02-14 LAB — RAPID URINE DRUG SCREEN, HOSP PERFORMED
Amphetamines: NOT DETECTED
Barbiturates: NOT DETECTED
Benzodiazepines: POSITIVE — AB
Cocaine: NOT DETECTED
Opiates: NOT DETECTED
Tetrahydrocannabinol: NOT DETECTED

## 2019-02-14 LAB — HEPATIC FUNCTION PANEL
ALT: 41 U/L (ref 0–44)
AST: 77 U/L — ABNORMAL HIGH (ref 15–41)
Albumin: 3.6 g/dL (ref 3.5–5.0)
Alkaline Phosphatase: 48 U/L (ref 38–126)
Bilirubin, Direct: <0.1 mg/dL (ref 0.0–0.2)
Total Bilirubin: 0.3 mg/dL (ref 0.3–1.2)
Total Protein: 7.6 g/dL (ref 6.5–8.1)

## 2019-02-14 LAB — TROPONIN I (HIGH SENSITIVITY)
Troponin I (High Sensitivity): 10 ng/L (ref <18)
Troponin I (High Sensitivity): 11 ng/L (ref <18)
Troponin I (High Sensitivity): 11 ng/L (ref <18)

## 2019-02-14 LAB — SARS CORONAVIRUS 2 BY RT PCR (HOSPITAL ORDER, PERFORMED IN ~~LOC~~ HOSPITAL LAB): SARS Coronavirus 2: NEGATIVE

## 2019-02-14 LAB — ETHANOL: Alcohol, Ethyl (B): 214 mg/dL — ABNORMAL HIGH (ref <10)

## 2019-02-14 MED ORDER — LORAZEPAM 1 MG PO TABS: 0.0000 mg | ORAL_TABLET | Freq: Two times a day (BID) | ORAL | Status: DC

## 2019-02-14 MED ORDER — NICOTINE 21 MG/24HR TD PT24
21.0000 mg | MEDICATED_PATCH | Freq: Every day | TRANSDERMAL | Status: DC
  Administered 2019-02-14: 21 mg via TRANSDERMAL
  Filled 2019-02-14: qty 1

## 2019-02-14 MED ORDER — LORAZEPAM 2 MG/ML IJ SOLN: 0.0000 mg | Freq: Four times a day (QID) | INTRAMUSCULAR | Status: DC

## 2019-02-14 MED ORDER — FAMOTIDINE 20 MG PO TABS
20.0000 mg | ORAL_TABLET | Freq: Two times a day (BID) | ORAL | Status: DC
  Administered 2019-02-14: 20 mg via ORAL
  Filled 2019-02-14: qty 1

## 2019-02-14 MED ORDER — PROPRANOLOL HCL 40 MG PO TABS
40.0000 mg | ORAL_TABLET | Freq: Two times a day (BID) | ORAL | Status: DC
  Administered 2019-02-14: 11:00:00 40 mg via ORAL
  Filled 2019-02-14 (×2): qty 1

## 2019-02-14 MED ORDER — TRAZODONE HCL 50 MG PO TABS: 100.0000 mg | ORAL_TABLET | Freq: Every evening | ORAL | Status: DC | PRN

## 2019-02-14 MED ORDER — THIAMINE HCL 100 MG/ML IJ SOLN: 100.0000 mg | Freq: Every day | INTRAMUSCULAR | Status: DC

## 2019-02-14 MED ORDER — LORAZEPAM 2 MG/ML IJ SOLN: 0.0000 mg | Freq: Two times a day (BID) | INTRAMUSCULAR | Status: DC

## 2019-02-14 MED ORDER — FLUOXETINE HCL 20 MG PO CAPS
20.0000 mg | ORAL_CAPSULE | Freq: Every day | ORAL | Status: DC
  Administered 2019-02-14: 20 mg via ORAL
  Filled 2019-02-14: qty 1

## 2019-02-14 MED ORDER — SODIUM CHLORIDE 0.9% FLUSH
3.0000 mL | Freq: Once | INTRAVENOUS | Status: AC
  Administered 2019-02-14: 3 mL via INTRAVENOUS

## 2019-02-14 MED ORDER — LORAZEPAM 1 MG PO TABS
0.0000 mg | ORAL_TABLET | Freq: Four times a day (QID) | ORAL | Status: DC
  Administered 2019-02-14: 1 mg via ORAL
  Filled 2019-02-14: qty 1

## 2019-02-14 MED ORDER — TOPIRAMATE 25 MG PO TABS
50.0000 mg | ORAL_TABLET | Freq: Three times a day (TID) | ORAL | Status: DC
  Administered 2019-02-14: 50 mg via ORAL
  Filled 2019-02-14: qty 2

## 2019-02-14 MED ORDER — VITAMIN B-1 100 MG PO TABS
100.0000 mg | ORAL_TABLET | Freq: Every day | ORAL | Status: DC
  Administered 2019-02-14: 100 mg via ORAL
  Filled 2019-02-14: qty 1

## 2019-02-14 MED ORDER — HYDROCHLOROTHIAZIDE 25 MG PO TABS
25.0000 mg | ORAL_TABLET | Freq: Every day | ORAL | Status: DC
  Administered 2019-02-14: 25 mg via ORAL
  Filled 2019-02-14: qty 1

## 2019-02-14 NOTE — ED Notes (Signed)
Pt is NSR on monitor 

## 2019-02-14 NOTE — ED Notes (Signed)
TTS monitor taken to bedside at this time.  Pt provided with warm blankets. NAD noted.

## 2019-02-14 NOTE — ED Notes (Signed)
Pt sleeping on the floor in room.  Want's to be there, asked for blanket, given.

## 2019-02-14 NOTE — ED Provider Notes (Signed)
Patient signed out to me by Dr. Leonides Schanz.  He had some complaints of depression.  He is pending TTS evaluation.  TTS is evaluated patient and feel he can be managed as an outpatient.   Pattricia Boss, MD 02/14/19 1200
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
## 2019-02-14 NOTE — Discharge Instructions (Addendum)
Chest pain evaluation here was negative for heart attack However, if your chest pain returns or worsens you should be reevaluated. Please follow-up with behavioral health specialty as advised by behavioral health consult
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
## 2019-02-15 LAB — NOVEL CORONAVIRUS, NAA: SARS-CoV-2, NAA: NOT DETECTED

## 2019-02-20 ENCOUNTER — Encounter (HOSPITAL_COMMUNITY): Payer: Self-pay | Admitting: Emergency Medicine

## 2019-02-20 ENCOUNTER — Other Ambulatory Visit: Payer: Self-pay

## 2019-02-20 ENCOUNTER — Emergency Department (HOSPITAL_COMMUNITY): Payer: Self-pay

## 2019-02-20 ENCOUNTER — Observation Stay (HOSPITAL_COMMUNITY)
Admission: AD | Admit: 2019-02-20 | Discharge: 2019-02-22 | Disposition: A | Discharge status: Home / Self Care | Payer: Federal, State, Local not specified - Other | Source: Intra-hospital | Attending: Psychiatry | Admitting: Psychiatry

## 2019-02-20 ENCOUNTER — Encounter (HOSPITAL_COMMUNITY): Payer: Self-pay

## 2019-02-20 ENCOUNTER — Emergency Department (HOSPITAL_COMMUNITY)
Admission: EM | Admit: 2019-02-20 | Discharge: 2019-02-20 | Disposition: A | Discharge status: Other Acute Inpatient Hospital | Payer: Self-pay | Attending: Emergency Medicine | Admitting: Emergency Medicine

## 2019-02-20 DIAGNOSIS — Y906 Blood alcohol level of 120-199 mg/100 ml: Secondary | ICD-10-CM | POA: Insufficient documentation

## 2019-02-20 DIAGNOSIS — Z79899 Other long term (current) drug therapy: Secondary | ICD-10-CM | POA: Insufficient documentation

## 2019-02-20 DIAGNOSIS — F17210 Nicotine dependence, cigarettes, uncomplicated: Secondary | ICD-10-CM | POA: Insufficient documentation

## 2019-02-20 DIAGNOSIS — F1410 Cocaine abuse, uncomplicated: Secondary | ICD-10-CM | POA: Insufficient documentation

## 2019-02-20 DIAGNOSIS — I10 Essential (primary) hypertension: Secondary | ICD-10-CM | POA: Insufficient documentation

## 2019-02-20 DIAGNOSIS — F329 Major depressive disorder, single episode, unspecified: Secondary | ICD-10-CM | POA: Insufficient documentation

## 2019-02-20 DIAGNOSIS — F332 Major depressive disorder, recurrent severe without psychotic features: Secondary | ICD-10-CM | POA: Insufficient documentation

## 2019-02-20 DIAGNOSIS — F1910 Other psychoactive substance abuse, uncomplicated: Secondary | ICD-10-CM

## 2019-02-20 DIAGNOSIS — Z20828 Contact with and (suspected) exposure to other viral communicable diseases: Secondary | ICD-10-CM | POA: Insufficient documentation

## 2019-02-20 DIAGNOSIS — R45851 Suicidal ideations: Secondary | ICD-10-CM | POA: Insufficient documentation

## 2019-02-20 DIAGNOSIS — Z888 Allergy status to other drugs, medicaments and biological substances status: Secondary | ICD-10-CM | POA: Insufficient documentation

## 2019-02-20 DIAGNOSIS — F1010 Alcohol abuse, uncomplicated: Principal | ICD-10-CM | POA: Insufficient documentation

## 2019-02-20 DIAGNOSIS — Z590 Homelessness: Secondary | ICD-10-CM | POA: Insufficient documentation

## 2019-02-20 HISTORY — DX: Other psychoactive substance dependence, uncomplicated: F19.20

## 2019-02-20 HISTORY — DX: Alcohol abuse, uncomplicated: F10.10

## 2019-02-20 LAB — COMPREHENSIVE METABOLIC PANEL
ALT: 37 U/L (ref 0–44)
AST: 96 U/L — ABNORMAL HIGH (ref 15–41)
Albumin: 3.5 g/dL (ref 3.5–5.0)
Alkaline Phosphatase: 49 U/L (ref 38–126)
Anion gap: 12 (ref 5–15)
BUN: 7 mg/dL (ref 6–20)
CO2: 21 mmol/L — ABNORMAL LOW (ref 22–32)
Calcium: 9 mg/dL (ref 8.9–10.3)
Chloride: 106 mmol/L (ref 98–111)
Creatinine, Ser: 1.39 mg/dL — ABNORMAL HIGH (ref 0.61–1.24)
GFR calc Af Amer: >60 mL/min (ref >60)
GFR calc non Af Amer: 56 mL/min — ABNORMAL LOW (ref >60)
Glucose, Bld: 107 mg/dL — ABNORMAL HIGH (ref 70–99)
Potassium: 3.3 mmol/L — ABNORMAL LOW (ref 3.5–5.1)
Sodium: 139 mmol/L (ref 135–145)
Total Bilirubin: 0.8 mg/dL (ref 0.3–1.2)
Total Protein: 7.4 g/dL (ref 6.5–8.1)

## 2019-02-20 LAB — CBC
HCT: 39.6 % (ref 39.0–52.0)
Hemoglobin: 13.6 g/dL (ref 13.0–17.0)
MCH: 30.4 pg (ref 26.0–34.0)
MCHC: 34.3 g/dL (ref 30.0–36.0)
MCV: 88.4 fL (ref 80.0–100.0)
Platelets: 216 10*3/uL (ref 150–400)
RBC: 4.48 MIL/uL (ref 4.22–5.81)
RDW: 14.1 % (ref 11.5–15.5)
WBC: 5.1 10*3/uL (ref 4.0–10.5)
nRBC: 0 % (ref 0.0–0.2)

## 2019-02-20 LAB — RAPID URINE DRUG SCREEN, HOSP PERFORMED
Amphetamines: NOT DETECTED
Barbiturates: NOT DETECTED
Benzodiazepines: POSITIVE — AB
Cocaine: POSITIVE — AB
Opiates: NOT DETECTED
Tetrahydrocannabinol: NOT DETECTED

## 2019-02-20 LAB — SARS CORONAVIRUS 2 (TAT 6-24 HRS): SARS Coronavirus 2: NEGATIVE

## 2019-02-20 LAB — ETHANOL: Alcohol, Ethyl (B): 110 mg/dL — ABNORMAL HIGH (ref <10)

## 2019-02-20 LAB — TROPONIN I (HIGH SENSITIVITY): Troponin I (High Sensitivity): 8 ng/L (ref <18)

## 2019-02-20 MED ORDER — THIAMINE HCL 100 MG/ML IJ SOLN
100.0000 mg | Freq: Every day | INTRAMUSCULAR | Status: DC
  Administered 2019-02-20: 20:00:00 100 mg via INTRAVENOUS
  Filled 2019-02-20: qty 2

## 2019-02-20 MED ORDER — LORAZEPAM 1 MG PO TABS: 0.0000 mg | ORAL_TABLET | Freq: Two times a day (BID) | ORAL | Status: DC

## 2019-02-20 MED ORDER — LORAZEPAM 2 MG/ML IJ SOLN: 0.0000 mg | Freq: Two times a day (BID) | INTRAMUSCULAR | Status: DC

## 2019-02-20 MED ORDER — VITAMIN B-1 100 MG PO TABS: 100.0000 mg | ORAL_TABLET | Freq: Every day | ORAL | Status: DC

## 2019-02-20 MED ORDER — LORAZEPAM 1 MG PO TABS: 0.0000 mg | ORAL_TABLET | Freq: Four times a day (QID) | ORAL | Status: DC

## 2019-02-20 MED ORDER — TRAZODONE HCL 50 MG PO TABS
50.0000 mg | ORAL_TABLET | Freq: Every evening | ORAL | Status: DC | PRN
  Administered 2019-02-21: 50 mg via ORAL
  Filled 2019-02-20: qty 1

## 2019-02-20 MED ORDER — ALUM & MAG HYDROXIDE-SIMETH 200-200-20 MG/5ML PO SUSP: 30.0000 mL | ORAL | Status: DC | PRN

## 2019-02-20 MED ORDER — ACETAMINOPHEN 325 MG PO TABS
650.0000 mg | ORAL_TABLET | Freq: Four times a day (QID) | ORAL | Status: DC | PRN
  Administered 2019-02-21 – 2019-02-22 (×3): 650 mg via ORAL
  Filled 2019-02-20 (×3): qty 2

## 2019-02-20 MED ORDER — MAGNESIUM HYDROXIDE 400 MG/5ML PO SUSP: 30.0000 mL | Freq: Every day | ORAL | Status: DC | PRN

## 2019-02-20 MED ORDER — LORAZEPAM 2 MG/ML IJ SOLN
0.0000 mg | Freq: Four times a day (QID) | INTRAMUSCULAR | Status: DC
  Administered 2019-02-20: 2 mg via INTRAVENOUS
  Filled 2019-02-20: qty 1

## 2019-02-20 NOTE — BH Assessment (Addendum)
[place] Assessment Progress Note Per [facility] AC patient will be accepted to OBS unit later this date. 205-1 COVID results pending AC to coordinate.

## 2019-02-20 NOTE — ED Notes (Signed)
Patient transported to X-ray 

## 2019-02-20 NOTE — ED Triage Notes (Signed)
Pt brought in by [facility]. [place] told a by stander that he was drinking all night and that he was smoking crack. Pt states his last drink was 0800-0900 today. Pt states 5/10 dull mid sternal chest painx2 wks. Per EMS when they got there pt was having a pseudo seizure, tremors wouldn't respond to EMS questions. After a few mins he responded to them. Pt reports having withdrawal symptoms tremors, nausea.

## 2019-02-20 NOTE — BH Assessment (Signed)
[place] Assessment Progress Note Case was staffed with Bobby Rumpf NP who recommended patient be observed and monitored.

## 2019-02-20 NOTE — BH Assessment (Addendum)
Assessment Note  Alvin Warren is an 58 y.o. male that presents this date with S/I. Patient voices a plan to run into traffic. Patient denies any H/I or AVH. Patient is brought in by [facility]. Patient was [place] the [place] depot and told a by stander that he was drinking all night and also had been using a excessive amount of cocaine. Patient informed that individual that he was having thoughts of self harm. EMS was contacted and when they got there patient was having a pseudo seizure, tremors wouldn't respond to EMS questions. EMS transported patient to [facility] and stabilized. Patient states he is currently homeless and "is done with life." Patient reports daily alcohol use (4 to 6 beers a day for the last two weeks) and cocaine (crack) use three to four times a week (1 gram or more) with last use on 02/19/19 when he reported using 1 gram. Patient denies any current withdrawals. Patient states his last drink was 0800-0900 today. Patient voices a plan to jump off a bridge. Patient denies any previous attempts or gestures at self harm. Patient was last seen at [facility] on 02/14/19 when he presented with similar symptoms and discharged with OP resources. Patient states he had been receiving OP services from [facility] who assisted with medication management for depression although cannot recall the last time he met with that provider or when he last took any medications to assist with symptom management. Patient reports ongoing symptoms to include: anhedonia and feeling useless. Patient is requesting assistance with ongoing SA issues and continued depression. Patient presents quiet, awake in scrubs with logical, coherent speech. Patient's eye contact was fair. Patient's mood was depressed. Patient's affect was flat. Patient's thought process was coherent, relevant. Patient's judgement was partial. Patient was oriented x4. Patient's concentration was normal. Patient's insight and impulse control was fair. Patient reported, if  discharged from [facility] he could not contract for safety. Case was staffed with Bobby Rumpf NP who recommended patient be observed and monitored.   Diagnosis: F33.2 MDD recurrent without psychotic symptoms, severe, polysubstance abuse  Past Medical History:  Past Medical History:  Diagnosis Date  . Alcohol abuse   . Alcohol withdrawal seizure ([place])   . Drug abuse and dependence ([place])   . Hypertension     Past Surgical History:  Procedure Laterality Date  . BACK SURGERY      Family History:  Family History  Problem Relation Age of Onset  . Heart failure Mother     Social History:  reports that he has been smoking cigarettes. He has been smoking about 0.50 packs per day. He has never used smokeless tobacco. He reports current alcohol use. He reports current drug use. Drugs: "Crack" cocaine and Cocaine.  Additional Social History:  Alcohol / Drug Use Pain Medications: See MAR Prescriptions: See MAR Over the Counter: See MAR History of alcohol / drug use?: Yes Longest period of sobriety (when/how long): Unknown Negative Consequences of Use: Personal relationships, Financial Withdrawal Symptoms: (Denies) Substance #1 Name of Substance 1: Alcohol 1 - Age of First Use: 23 1 - Amount (size/oz): Varies 1 - Frequency: Varies 1 - Duration: Ongoing 1 - Last Use / Amount: 02/19/19 6 beers Substance #2 Name of Substance 2: Cocaine (crack) 2 - Age of First Use: 32 2 - Amount (size/oz): Varies 2 - Frequency: Varies 2 - Duration: Ongoing 2 - Last Use / Amount: 02/19/19 1 gram  CIWA: CIWA-Ar BP: 116/65 Pulse Rate: 79 Nausea and Vomiting: 2 Tactile Disturbances: very  mild itching, pins and needles, burning or numbness Tremor: two Auditory Disturbances: not present Paroxysmal Sweats: no sweat visible Visual Disturbances: not present Anxiety: no anxiety, at ease Headache, Fullness in Head: none present Agitation: normal activity Orientation and Clouding of Sensorium: oriented and can do  serial additions CIWA-Ar Total: 5 COWS:    Allergies:  Allergies  Allergen Reactions  . Lisinopril     Angioedema. SHOULD NOT be on ACEi or ARB for life.    Home Medications: (Not in a hospital admission)   OB/GYN Status:  No LMP for male patient.  General Assessment Data Location of Assessment: [facility] ED TTS Assessment: In system Is this a Tele or Face-to-Face Assessment?: Face-to-Face Is this an Initial Assessment or a Re-assessment for this encounter?: Initial Assessment Patient Accompanied by:: N/A Language Other than English: No Living Arrangements: Other (Comment) What gender do you identify as?: Male Marital status: Married Pregnancy Status: No Living Arrangements: Spouse/significant other Can pt return to current living arrangement?: Yes Admission Status: Voluntary Is patient capable of signing voluntary admission?: Yes Referral Source: Self/Family/Friend Insurance type: Self pay     Crisis Care Plan Living Arrangements: Spouse/significant other Legal Guardian: (NA) Name of Psychiatrist: None Name of Therapist: None  Education Status Is patient currently in school?: No Is the patient employed, unemployed or receiving disability?: Unemployed  Risk to self with the past 6 months Suicidal Ideation: Yes-Currently Present Has patient been a risk to self within the past 6 months prior to admission? : Yes Suicidal Intent: Yes-Currently Present Has patient had any suicidal intent within the past 6 months prior to admission? : Yes Is patient at risk for suicide?: Yes Suicidal Plan?: Yes-Currently Present Has patient had any suicidal plan within the past 6 months prior to admission? : Yes Specify Current Suicidal Plan: Walk into traffic Access to Means: Yes Specify Access to Suicidal Means: Traffic What has been your use of drugs/alcohol within the last 12 months?: Current use Previous Attempts/Gestures: No How many times?: 0 Other Self Harm Risks: NA Triggers  for Past Attempts: (NA) Intentional Self Injurious Behavior: None Comment - Self Injurious Behavior: (NA) Family Suicide History: No Recent stressful life event(s): Other (Comment)(Homeless) Persecutory voices/beliefs?: No Depression: Yes Depression Symptoms: Loss of interest in usual pleasures Substance abuse history and/or treatment for substance abuse?: Yes Suicide prevention information given to non-admitted patients: Not applicable  Risk to Others within the past 6 months Homicidal Ideation: No Does patient have any lifetime risk of violence toward others beyond the six months prior to admission? : No Thoughts of Harm to Others: No Current Homicidal Intent: No Current Homicidal Plan: No Access to Homicidal Means: No Identified Victim: NA History of harm to others?: No Assessment of Violence: None Noted Violent Behavior Description: NA Does patient have access to weapons?: No Criminal Charges Pending?: No Does patient have a court date: No Is patient on probation?: No  Psychosis Hallucinations: None noted Delusions: None noted  Mental Status Report Appearance/Hygiene: Unremarkable Eye Contact: Good Motor Activity: Freedom of movement Speech: Logical/coherent Level of Consciousness: Quiet/awake Mood: Depressed Affect: Appropriate to circumstance Anxiety Level: Minimal Thought Processes: Coherent, Relevant Judgement: Partial Orientation: Person, Place, Time Obsessive Compulsive Thoughts/Behaviors: None  Cognitive Functioning Concentration: Normal Memory: Recent Intact, Remote Intact Is patient IDD: No Insight: Fair Impulse Control: Poor Appetite: Fair Have you had any weight changes? : No Change Sleep: No Change Total Hours of Sleep: 7 Vegetative Symptoms: None  ADLScreening [facility] Assessment Services) Patient's cognitive ability adequate to  safely complete daily activities?: Yes Patient able to express need for assistance with ADLs?: Yes Independently  performs ADLs?: Yes (appropriate for developmental age)  Prior Inpatient Therapy Prior Inpatient Therapy: Yes Prior Therapy Dates: 2020, 2019, 2018 Prior Therapy Facilty/Provider(s): [facility], [facility]  Reason for Treatment: MH issues  Prior Outpatient Therapy Prior Outpatient Therapy: Yes Prior Therapy Dates: 2019, 2018 Prior Therapy Facilty/Provider(s): Various Reason for Treatment: Med mang Does patient have an ACCT team?: No Does patient have Intensive In-House Services?  : No Does patient have [facility] services? : No Does patient have P4CC services?: No  ADL Screening (condition at time of admission) Patient's cognitive ability adequate to safely complete daily activities?: Yes Is the patient deaf or have difficulty hearing?: No Does the patient have difficulty seeing, even when wearing glasses/contacts?: No Does the patient have difficulty concentrating, remembering, or making decisions?: No Patient able to express need for assistance with ADLs?: Yes Does the patient have difficulty dressing or bathing?: No Independently performs ADLs?: Yes (appropriate for developmental age) Does the patient have difficulty walking or climbing stairs?: No Weakness of Legs: None Weakness of Arms/Hands: None  Home Assistive Devices/Equipment Home Assistive Devices/Equipment: None  Therapy Consults (therapy consults require a physician order) PT Evaluation Needed: No OT Evalulation Needed: No SLP Evaluation Needed: No Abuse/Neglect Assessment (Assessment to be complete while patient is alone) Physical Abuse: Denies Verbal Abuse: Denies Sexual Abuse: Denies Exploitation of patient/patient's resources: Denies Self-Neglect: Denies Values / Beliefs Cultural Requests During Hospitalization: None Spiritual Requests During Hospitalization: None Consults Spiritual Care Consult Needed: No Social Work Consult Needed: No Regulatory affairs officer (For Healthcare) Does Patient Have a Medical Advance  Directive?: No Would patient like information on creating a medical advance directive?: No - Patient declined          Disposition: Case was staffed with Bobby Rumpf NP who recommended patient be observed and monitored. Disposition Initial Assessment Completed for this Encounter: Yes Disposition of Patient: (Observe and monitor) Type of inpatient treatment program: (Observe ) Patient refused recommended treatment: No Mode of transportation if patient is discharged/movement?: Tomasita Crumble)  On Site Evaluation by:   Reviewed with Physician:    Mamie Nick 02/20/2019 3:14 PM

## 2019-02-20 NOTE — Discharge Instructions (Signed)
To be admitted to [facility]

## 2019-02-20 NOTE — ED Provider Notes (Signed)
[facility] EMERGENCY DEPARTMENT Provider Note   CSN: [id number] Arrival date & time: 02/20/19  1117     History   Chief Complaint No chief complaint on file.   HPI Alvin Warren is a 58 y.o. male.     HPI  58 y/o male - hx of substance abuse and depression - uses ETOH daily and drugs (cocaine), [place]" - no memory of same.  Does not know why he was there, does not know where he was going, within a short time of speaking with the patient he breaks into tears stating that he does not want a live anymore and asked if I can give him something that will cause him to die.  The patient does have a history of recurrent suicidal ideation, depression, and substance abuse and has had several admissions in the past year for the same.  He cannot quantify how much alcohol he had but states he was drinking rubbing alcohol mixed with water because he could not afford other alcohol.  He last used cocaine last night.  He denies opiate use or benzo use.  It is difficult to get any information out of the patient as he was distraught tearful and not answering questions fluently  Past Medical History:  Diagnosis Date  . Alcohol withdrawal seizure ([place])   . Hypertension     Patient Active Problem List   Diagnosis Date Noted  . MDD (major depressive disorder) 02/04/2019  . Essential hypertension 12/07/2018  . Tobacco abuse 12/07/2018  . ETOH abuse 12/07/2018  . Angioedema of lips 12/07/2018  . Acute kidney injury ([place]) 12/06/2018  . MDD (major depressive disorder), recurrent episode, severe ([place]) 11/07/2018  . Cocaine abuse ([place]) 11/07/2018    Past Surgical History:  Procedure Laterality Date  . BACK SURGERY          Home Medications    Prior to Admission medications   Medication Sig Start Date End Date Taking? Authorizing Provider  famotidine (PEPCID) 20 MG tablet Take 1 tablet (20 mg total) by mouth 2 (two) times  daily. 12/07/18   Dhungel, Nishant, MD  FLUoxetine (PROZAC) 20 MG capsule Take 1 capsule (20 mg total) by mouth daily. 02/09/19   Johnn Hai, MD  hydrochlorothiazide (HYDRODIURIL) 25 MG tablet Take 1 tablet (25 mg total) by mouth daily. 12/09/18   Dhungel, Nishant, MD  nicotine (NICODERM CQ - DOSED IN MG/24 HOURS) 21 mg/24hr patch Place 1 patch (21 mg total) onto the skin daily. Patient not taking: Reported on 02/03/2019 12/07/18   Dhungel, Flonnie Overman, MD  propranolol (INDERAL) 40 MG tablet Take 1 tablet (40 mg total) by mouth 2 (two) times daily. 11/11/18   Johnn Hai, MD  topiramate (TOPAMAX) 50 MG tablet Take 1 tablet (50 mg total) by mouth 3 (three) times daily. 02/08/19   Johnn Hai, MD  traZODone (DESYREL) 100 MG tablet Take 1 tablet (100 mg total) by mouth at bedtime as needed for sleep. 11/11/18   Johnn Hai, MD    Family History Family History  Problem Relation Age of Onset  . Heart failure Mother     Social History Social History   Tobacco Use  . Smoking status: Current Every Day Smoker    Packs/day: 0.50    Types: Cigarettes  . Smokeless tobacco: Never Used  Substance Use Topics  . Alcohol use: Yes    Comment: ''I drank all day yesterday''  . Drug use: Not  Currently    Types: "Crack" cocaine    Comment: Not sure of last use     Allergies   Lisinopril   Review of Systems Review of Systems  Unable to perform ROS: Other     Physical Exam Updated Vital Signs There were no vitals taken for this visit.  Physical Exam Vitals signs and nursing note reviewed.  Constitutional:      General: He is not in acute distress.    Appearance: He is well-developed.  HENT:     Head: Normocephalic and atraumatic.     Mouth/Throat:     Pharynx: No oropharyngeal exudate.  Eyes:     General: No scleral icterus.       Right eye: No discharge.        Left eye: No discharge.     Conjunctiva/sclera: Conjunctivae normal.     Pupils: Pupils are equal, round, and reactive to  light.  Neck:     Musculoskeletal: Normal range of motion and neck supple.     Thyroid: No thyromegaly.     Vascular: No JVD.  Cardiovascular:     Rate and Rhythm: Normal rate and regular rhythm.     Heart sounds: Normal heart sounds. No murmur. No friction rub. No gallop.   Pulmonary:     Effort: Pulmonary effort is normal. No respiratory distress.     Breath sounds: Normal breath sounds. No wheezing or rales.  Abdominal:     General: Bowel sounds are normal. There is no distension.     Palpations: Abdomen is soft. There is no mass.     Tenderness: There is no abdominal tenderness.  Musculoskeletal: Normal range of motion.        General: No tenderness.  Lymphadenopathy:     Cervical: No cervical adenopathy.  Skin:    General: Skin is warm and dry.     Findings: No erythema or rash.  Neurological:     General: No focal deficit present.     Mental Status: He is alert.     Coordination: Coordination normal.     Comments: The patient appears slightly somnolent but easily arousable, tearful, difficult to redirect  Psychiatric:        Behavior: Behavior normal.      ED Treatments / Results  Labs (all labs ordered are listed, but only abnormal results are displayed) Labs Reviewed  CBC  COMPREHENSIVE METABOLIC PANEL  ETHANOL  RAPID URINE DRUG SCREEN, HOSP PERFORMED  TROPONIN I (HIGH SENSITIVITY)    EKG None  Radiology No results found.  Procedures Procedures (including critical care time)  Medications Ordered in ED Medications - No data to display   Initial Impression / Assessment and Plan / ED Course  I have reviewed the triage vital signs and the nursing notes.  Pertinent labs & imaging results that were available during my care of the patient were reviewed by me and considered in my medical decision making (see chart for details).        No signs of trauma, no signs of deformity, no signs of head injury.  It is not clear whether this patient had a  syncopal episode and fell or whether he was intoxicated on the ground and found in that position, at this time he is requesting assistance with his mental health which think is reasonable given that he appears so distraught.  We will also evaluate for medical problems, labs pending  Final Clinical Impressions(s) / ED Diagnoses   Final diagnoses:  None    ED Discharge Orders    None       Noemi Chapel, MD 02/23/19 [phone]

## 2019-02-21 ENCOUNTER — Other Ambulatory Visit: Payer: Self-pay

## 2019-02-21 MED ORDER — ADULT MULTIVITAMIN W/MINERALS CH
1.0000 | ORAL_TABLET | Freq: Every day | ORAL | Status: DC
  Administered 2019-02-22: 1 via ORAL
  Filled 2019-02-21: qty 1

## 2019-02-21 MED ORDER — HYDROXYZINE HCL 25 MG PO TABS
25.0000 mg | ORAL_TABLET | Freq: Four times a day (QID) | ORAL | Status: DC | PRN
  Administered 2019-02-21: 25 mg via ORAL
  Filled 2019-02-21: qty 1

## 2019-02-21 MED ORDER — ONDANSETRON 4 MG PO TBDP
4.0000 mg | ORAL_TABLET | Freq: Four times a day (QID) | ORAL | Status: DC | PRN
  Administered 2019-02-21 – 2019-02-22 (×3): 4 mg via ORAL
  Filled 2019-02-21 (×3): qty 1

## 2019-02-21 MED ORDER — VITAMIN B-1 100 MG PO TABS
100.0000 mg | ORAL_TABLET | Freq: Every day | ORAL | Status: DC
  Administered 2019-02-22: 100 mg via ORAL
  Filled 2019-02-21: qty 1

## 2019-02-21 MED ORDER — LOPERAMIDE HCL 2 MG PO CAPS: 2.0000 mg | ORAL_CAPSULE | ORAL | Status: DC | PRN

## 2019-02-21 MED ORDER — THIAMINE HCL 100 MG/ML IJ SOLN: 100.0000 mg | Freq: Once | INTRAMUSCULAR | Status: DC

## 2019-02-21 MED ORDER — CHLORDIAZEPOXIDE HCL 25 MG PO CAPS
25.0000 mg | ORAL_CAPSULE | Freq: Four times a day (QID) | ORAL | Status: DC | PRN
  Administered 2019-02-21 (×2): 25 mg via ORAL
  Filled 2019-02-21 (×2): qty 1

## 2019-02-21 NOTE — Progress Notes (Signed)
DAR NOTE: Patient presents with anxious affect and depressed mood. Pt complained of shakiness, headache, nausea,  AVH. Pt's been in the bed all the time, reports poor appetite, insomnia, but denies SI. Maintained on routine safety checks.  Medications given as prescribed.  Support and encouragement offered as needed.  Will continue to monitor.

## 2019-02-21 NOTE — H&P (Addendum)
[facility] Provider Admission PAA/H&P  Patient Identification: Alvin Warren MRN:  [id number] Date of Evaluation:  02/21/2019 Chief Complaint:  MDD Principal Diagnosis: <principal problem not specified> Diagnosis:  Active Problems:   MDD (major depressive disorder)  Alvin Warren is an 58 y.o. male seen with complaints of S/I and substance use. Patient voices a plan to run into traffic. Presented to the [facility] OBS unit. The patient was seen face-to-face by this provider; chart reviewed and consulted with Dr. Parke Poisson on 02/21/2019 due to the care of the patient. It was discussed with the provider that the patient does meet criteria to remain om the [facility] OBS unit with detox protocol inplaced. The patient admits to alcohol use including rubbing alcohol, positive for Benzo's and cocaine. On evaluation the patient  is alert and oriented x3, calm and cooperative, and mood-congruent with affect. The patient does not appear to be responding to internal or external stimuli. Neither is the patient is not presenting with any delusional thinking. The patient denies auditory hallucinations but admit to visual hallucinations. The patient admit to suicidal ideations, but denies homicidal, or self-harm ideations. The patient is not presenting with any psychotic or paranoid behaviors. During an encounter with the patient, he was able to answer questions appropriately.  Plan: The patient is a safety risk to self and does require psychiatric inpatient admission for stabilization and treatment. The patient will be placed on detox protocol to assist him any withdrawal symptoms he might experience.   History of Present Illness: Suicidal ideation Associated Signs/Symptoms: Depression Depression Symptoms:  depressed mood, anhedonia, feelings of worthlessness/guilt, suicidal thoughts with specific plan, (Hypo) Manic Symptoms:  Impulsivity, Anxiety Symptoms:  Panic Symptoms, Psychotic Symptoms:  None PTSD  Symptoms: NA Total Time spent with patient: 30 minutes  Past Psychiatric History:  Is the patient at risk to self? Yes.    Has the patient been a risk to self in the past 6 months? Yes.    Has the patient been a risk to self within the distant past? No.  Is the patient a risk to others? No.  Has the patient been a risk to others in the past 6 months? No.  Has the patient been a risk to others within the distant past? No.   Prior Inpatient Therapy:   Prior Outpatient Therapy:    Alcohol Screening:   Substance Abuse History in the last 12 months:  Yes.   Consequences of Substance Abuse: Blackouts:  on 02/20/19 Previous Psychotropic Medications: Yes  Psychological Evaluations: unknown Past Medical History:  Past Medical History:  Diagnosis Date  . Alcohol abuse   . Alcohol withdrawal seizure ([place])   . Drug abuse and dependence ([place])   . Hypertension     Past Surgical History:  Procedure Laterality Date  . BACK SURGERY     Family History:  Family History  Problem Relation Age of Onset  . Heart failure Mother    Family Psychiatric History: Yes Tobacco Screening:  Yes Social History:  Social History   Substance and Sexual Activity  Alcohol Use Yes   Comment: pt drink 4 40s a day     Social History   Substance and Sexual Activity  Drug Use Yes  . Types: "Crack" cocaine, Cocaine   Comment: Not sure of last use    Additional Social History:  Allergies:   Allergies  Allergen Reactions  . Lisinopril     Angioedema. SHOULD NOT be on ACEi or ARB for life.   Lab Results:  Results for orders placed or performed during the hospital encounter of 02/20/19 (from the past 48 hour(s))  CBC     Status: None   Collection Time: 02/20/19 11:53 AM  Result Value Ref Range   WBC 5.1 4.0 - 10.5 K/uL   RBC 4.48 4.22 - 5.81 MIL/uL   Hemoglobin 13.6 13.0 - 17.0 g/dL   HCT 39.6 39.0 - 52.0 %   MCV 88.4 80.0 - 100.0 fL   MCH 30.4 26.0 - 34.0  pg   MCHC 34.3 30.0 - 36.0 g/dL   RDW 14.1 11.5 - 15.5 %   Platelets 216 150 - 400 K/uL   nRBC 0.0 0.0 - 0.2 %    Comment: Performed at [facility], [place] [address] [place], [place] [address]  Ethanol     Status: Abnormal   Collection Time: 02/20/19 11:53 AM  Result Value Ref Range   Alcohol, Ethyl (B) 110 (H) <10 mg/dL    Comment: (NOTE) Lowest detectable limit for serum alcohol is 10 mg/dL. For medical purposes only. Performed at [facility], [place] [address], [place], [place] [address]   Comprehensive metabolic panel     Status: Abnormal   Collection Time: 02/20/19 12:19 PM  Result Value Ref Range   Sodium 139 135 - 145 mmol/L   Potassium 3.3 (L) 3.5 - 5.1 mmol/L   Chloride 106 98 - 111 mmol/L   CO2 21 (L) 22 - 32 mmol/L   Glucose, Bld 107 (H) 70 - 99 mg/dL   BUN 7 6 - 20 mg/dL   Creatinine, Ser 1.39 (H) 0.61 - 1.24 mg/dL   Calcium 9.0 8.9 - 10.3 mg/dL   Total Protein 7.4 6.5 - 8.1 g/dL   Albumin 3.5 3.5 - 5.0 g/dL   AST 96 (H) 15 - 41 U/L   ALT 37 0 - 44 U/L   Alkaline Phosphatase 49 38 - 126 U/L   Total Bilirubin 0.8 0.3 - 1.2 mg/dL   GFR calc non Af Amer 56 (L) >60 mL/min   GFR calc Af Amer >60 >60 mL/min   Anion gap 12 5 - 15    Comment: Performed at [facility] [address] [place], [place] [address]  Troponin I (High Sensitivity)     Status: None   Collection Time: 02/20/19 12:19 PM  Result Value Ref Range   Troponin I (High Sensitivity) 8 <18 ng/L    Comment: (NOTE) Elevated high sensitivity troponin I (hsTnI) values and significant  changes across serial measurements may suggest ACS but many other  chronic and acute conditions are known to elevate hsTnI results.  Refer to the "Links" section for chest pain algorithms and additional  guidance. Performed at [facility], [place] [address], [place],  [address]   Urine rapid drug screen (hosp performed)     Status: Abnormal   Collection Time: 02/20/19 12:39 PM  Result Value  Ref Range   Opiates NONE DETECTED NONE DETECTED   Cocaine POSITIVE (A) NONE DETECTED   Benzodiazepines POSITIVE (A) NONE DETECTED   Amphetamines NONE DETECTED NONE DETECTED   Tetrahydrocannabinol NONE DETECTED NONE DETECTED   Barbiturates NONE DETECTED NONE DETECTED    Comment: (NOTE) DRUG SCREEN FOR MEDICAL PURPOSES ONLY.  IF CONFIRMATION IS NEEDED FOR ANY PURPOSE, NOTIFY LAB WITHIN  5 DAYS. LOWEST DETECTABLE LIMITS FOR URINE DRUG SCREEN Drug Class                     Cutoff (ng/mL) Amphetamine and metabolites    1000 Barbiturate and metabolites    200 Benzodiazepine                 710 Tricyclics and metabolites     300 Opiates and metabolites        300 Cocaine and metabolites        300 THC                            50 Performed at [facility], [place] [address], [place], [place] [address]   SARS CORONAVIRUS 2 Nasal Swab Aptima Multi Swab     Status: None   Collection Time: 02/20/19  1:12 PM   Specimen: Aptima Multi Swab; Nasal Swab  Result Value Ref Range   SARS Coronavirus 2 NEGATIVE NEGATIVE    Comment: (NOTE) SARS-CoV-2 target nucleic acids are NOT DETECTED. The SARS-CoV-2 RNA is generally detectable in upper and lower respiratory specimens during the acute phase of infection. Negative results do not preclude SARS-CoV-2 infection, do not rule out co-infections with other pathogens, and should not be used as the sole basis for treatment or other patient management decisions. Negative results must be combined with clinical observations, patient history, and epidemiological information. The expected result is Negative. Fact Sheet for Patients: SugarRoll.be Fact Sheet for Healthcare Providers: https://www.woods-mathews.com/ This test is not yet approved or cleared by the [place] FDA and  has been authorized for detection and/or diagnosis of SARS-CoV-2 by FDA under an Emergency Use Authorization (EUA). This EUA will  remain  in effect (meaning this test can be used) for the duration of the COVID-19 declaration under Section 56 4(b)(1) of the Act, 21 U.S.C. section 360bbb-3(b)(1), unless the authorization is terminated or revoked sooner. Performed at [facility], [place] [address] [place],  [address]     Blood Alcohol level:  Lab Results  Component Value Date   ETH 110 (H) 02/20/2019   ETH 214 (H) 70/35/0093    Metabolic Disorder Labs:  Lab Results  Component Value Date   HGBA1C 5.7 (H) 02/05/2019   MPG 116.89 02/05/2019   No results found for: PROLACTIN Lab Results  Component Value Date   CHOL 213 (H) 02/05/2019   TRIG 80 02/05/2019   HDL 119 02/05/2019   CHOLHDL 1.8 02/05/2019   VLDL 16 02/05/2019   LDLCALC 78 02/05/2019    Current Medications: Current Facility-Administered Medications  Medication Dose Route Frequency Provider Last Rate Last Dose  . acetaminophen (TYLENOL) tablet 650 mg  650 mg Oral Q6H PRN Derrill Center, NP   650 mg at 02/21/19 0655  . alum & mag hydroxide-simeth (MAALOX/MYLANTA) 200-200-20 MG/5ML suspension 30 mL  30 mL Oral Q4H PRN Derrill Center, NP      . magnesium hydroxide (MILK OF MAGNESIA) suspension 30 mL  30 mL Oral Daily PRN Derrill Center, NP      . traZODone (DESYREL) tablet 50 mg  50 mg Oral QHS PRN Derrill Center, NP       PTA Medications: Medications Prior to Admission  Medication Sig Dispense Refill Last Dose  . famotidine (PEPCID) 20 MG tablet Take 1 tablet (20 mg total) by mouth 2 (two) times daily. 10 tablet 0  Past Month at Unknown time  . FLUoxetine (PROZAC) 20 MG capsule Take 1 capsule (20 mg total) by mouth daily. 30 capsule 2 Past Month at Unknown time  . hydrochlorothiazide (HYDRODIURIL) 25 MG tablet Take 1 tablet (25 mg total) by mouth daily. 30 tablet 0 Past Month at Unknown time  . propranolol (INDERAL) 40 MG tablet Take 1 tablet (40 mg total) by mouth 2 (two) times daily. 60 tablet 2 Past Month at Unknown time  .  topiramate (TOPAMAX) 50 MG tablet Take 1 tablet (50 mg total) by mouth 3 (three) times daily. 90 tablet 1 Past Month at Unknown time  . traZODone (DESYREL) 100 MG tablet Take 1 tablet (100 mg total) by mouth at bedtime as needed for sleep. 90 tablet 1 Past Month at Unknown time    Musculoskeletal: Strength & Muscle Tone: within normal limits Gait & Station: unsteady Patient leans: N/A  Psychiatric Specialty Exam: Physical Exam  Nursing note and vitals reviewed. Constitutional: He is oriented to person, place, and time. He appears well-developed and well-nourished.  HENT:  Head: Normocephalic.  Eyes: Pupils are equal, round, and reactive to light. Conjunctivae are normal.  Neck: Normal range of motion. Neck supple.  Cardiovascular: Normal rate.  Respiratory: Effort normal.  Musculoskeletal: Normal range of motion.  Neurological: He is alert and oriented to person, place, and time.  Skin: Skin is warm and dry.  Psychiatric: His behavior is normal.    Review of Systems  Neurological: Positive for tremors.  Psychiatric/Behavioral: Positive for depression, substance abuse and suicidal ideas. The patient is nervous/anxious.     Blood pressure (!) 143/91, pulse 75, temperature 98.5 F (36.9 C), temperature source Oral, resp. rate 20, SpO2 100 %.There is no height or weight on file to calculate BMI.  General Appearance: Disheveled  Eye Contact:  Minimal  Speech:  Garbled  Volume:  Decreased  Mood:  Depressed  Affect:  Congruent, Depressed and Flat  Thought Process:  Goal Directed  Orientation:  Full (Time, Place, and Person)  Thought Content:  Logical  Suicidal Thoughts:  Yes.  with intent/plan  Homicidal Thoughts:  No  Memory:  Immediate;   Fair Recent;   Fair  Judgement:  Fair  Insight:  Fair  Psychomotor Activity:  Decreased  Concentration:  Concentration: Poor and Attention Span: Poor  Recall:  [employer] of Knowledge:  Fair  Language:  Fair  Akathisia:  Negative   Handed:  Right  AIMS (if indicated):     Assets:  Communication Skills Housing Resilience Social Support  ADL's:  Intact  Cognition:  WNL  Sleep:   Okay      Treatment Plan Summary: Daily contact with patient to assess and evaluate symptoms and progress in treatment and Medication management  Observation Level/Precautions:  Detox 15 minute checks Laboratory:  N/A Psychotherapy:   Medications:   Consultations:   Discharge Concerns:   Estimated LOS: Other:      Caroline Sauger, NP 8/2/202012:17 PM   Attest to NP Note

## 2019-02-21 NOTE — Progress Notes (Signed)
7a-7p Shift:  D: Pt spent most of the day sleeping but woke for dinner at which time CIWA scale was administered with a result of 11.  He stated that he had been drinking rubbing alcohol PTA.  Pt was medicated with librium, vistaril, and zofran as ordered.  Pt was sleeping soundly within an hour.     A:  Support, education, and encouragement provided as appropriate to situation.  Medications administered per MD order.  Level 3 checks continued for safety.   R:  Pt receptive to measures; Safety maintained.

## 2019-02-21 NOTE — Plan of Care (Signed)
[facility] Crisis Plan  Reason for Crisis Plan:  Crisis Stabilization   Plan of Care:  Referral for Inpatient Hospitalization  Family Support:      Current Living Environment:     Insurance:   Hospital Account    Name Acct ID Class Status Primary Coverage   Alvin Warren, Alvin Warren [id number] [place]/DD/SAS - [place]        Guarantor Account (for Hospital Account [id number])    Name Relation to Pt Service Area Active? Acct Type   Alvin Warren, Alvin Warren   Address Phone       [place], [place] [address] [phone]          Coverage Information (for Hospital Account [id number])    F/O Payor/Plan Precert #   Alvin Warren MH/DD/SAS/3-WAY [facility]    Subscriber Subscriber #   Alvin Warren, Alvin Warren [id number]   Address Phone   [address] [place], Alvin Warren [address] [phone]      Legal Guardian:     Primary Care Provider:  Patient, No Pcp Per  Current Outpatient Providers:  Alvin Dresser, DO  Psychiatrist:     Counselor/Therapist:     Compliant with Medications:  No  Additional Information:   Alvin Warren 8/2/202012:08 AM

## 2019-02-21 NOTE — Progress Notes (Signed)
Patient ID: Alvin Warren, male   DOB: 01-Jul-1961, 58 y.o.   MRN: [id number] Pt A&O x 3, transferred from [facility] presents with complaint of SI, plan to jump off a bridge.  Pt sad & depressed with flat affect.  Feeling hopeless.  Denies HI or AVH.  Pt reports he drinks alcohol everyday including rubbing alcohol.  Pt is homeless. Skin search completed, monitoring for safety.

## 2019-02-22 ENCOUNTER — Encounter (HOSPITAL_COMMUNITY): Payer: Self-pay | Admitting: Registered Nurse

## 2019-02-22 DIAGNOSIS — F1010 Alcohol abuse, uncomplicated: Secondary | ICD-10-CM

## 2019-02-22 NOTE — Progress Notes (Signed)
D: Pt alert and oriented. Pt's mood/affect appears as anxious/sad. Pt reports experiencing N/V, tremors (which are visible and can be felt), anxiety, as well as loose stools. Pt reports having a poor appetite and not sleeping "too well". Pt is cooperative and went back to sleep after interaction. Pt denies experiencing any pain, SI/HI, or AVH at this time.   A: Scheduled medications administered to pt, per MD orders. Support and encouragement provided. Frequent verbal contact made. Routine safety checks conducted q15 minutes.   R: No adverse drug reactions noted. Pt verbally contracts for safety at this time. Pt complaint with medications and treatment plan. Pt interacts well with staff on the unit. Pt remains safe at this time. Will continue to monitor.

## 2019-02-22 NOTE — Discharge Summary (Addendum)
[facility] Psych Observation Discharge  02/22/2019 3:03 PM Alvin Warren  MRN:  [id number] Principal Problem: ETOH abuse Discharge Diagnoses: Principal Problem:   ETOH abuse Active Problems:   Cocaine abuse ([place])   MDD (major depressive disorder)   Subjective: Alvin Warren, 58 y.o., male patient seen face to face by this provider, Dr. Mariea Clonts; and chart reviewed on 02/22/19.  On evaluation Alvin Warren reports he had been drinking a lot of alcohol/rubbing alcohol and started having suicidal thoughts.  States that he has been trying to get into a rehab facility but has not been able to find one that has available beds.  Patient states that he is feeling better today and denies suicidal/self-harm/homicidal ideation, psychosis, and paranoia.  States that he is still interested in finding a rehab facility.  Patient states that he has been homeless for 4-5 months but has been to the [facility] for assistance with shower and phone services.  Patient states that he is originally from [place], [place] but came to [place] because he was told there were more resources.  Patient states that he is still interested in rehab services  During evaluation Alvin Warren is alert/oriented x 4; calm/cooperative; and mood is congruent with affect.  He does not appear to be responding to internal/external stimuli or delusional thoughts.  Patient denies suicidal/self-harm/homicidal ideation, psychosis, and paranoia.  Patient answered question appropriately.     Total Time spent with patient: 30 minutes  Past Psychiatric History: Alcohol abuse, cocaine abuse, MDD  Past Medical History:  Past Medical History:  Diagnosis Date  . Alcohol abuse   . Alcohol withdrawal seizure ([place])   . Drug abuse and dependence ([place])   . Hypertension     Past Surgical History:  Procedure Laterality Date  . BACK SURGERY     Family History:  Family History  Problem Relation Age of Onset  . Heart failure Mother    Family Psychiatric  History:  Denies Social History:  Social History   Substance and Sexual Activity  Alcohol Use Yes   Comment: pt drink 4 40s a day     Social History   Substance and Sexual Activity  Drug Use Yes  . Types: "Crack" cocaine, Cocaine   Comment: Not sure of last use    Social History   Socioeconomic History  . Marital status: Married    Spouse name: Not on file  . Number of children: Not on file  . Years of education: Not on file  . Highest education level: Not on file  Occupational History  . Not on file  Social Needs  . Financial resource strain: Not on file  . Food insecurity    Worry: Not on file    Inability: Not on file  . Transportation needs    Medical: Not on file    Non-medical: Not on file  Tobacco Use  . Smoking status: Current Every Day Smoker    Packs/day: 0.50    Types: Cigarettes  . Smokeless tobacco: Never Used  Substance and Sexual Activity  . Alcohol use: Yes    Comment: pt drink 4 40s a day  . Drug use: Yes    Types: "Crack" cocaine, Cocaine    Comment: Not sure of last use  . Sexual activity: Yes    Birth control/protection: Condom  Lifestyle  . Physical activity    Days per week: Not on file    Minutes per session: Not on file  . Stress: Not on file  Relationships  .  Social Herbalist on phone: Not on file    Gets together: Not on file    Attends religious service: Not on file    Active member of club or organization: Not on file    Attends meetings of clubs or organizations: Not on file    Relationship status: Not on file  Other Topics Concern  . Not on file  Social History Narrative  . Not on file    Has this patient used any form of tobacco in the last 30 days? (Cigarettes, Smokeless Tobacco, Cigars, and/or Pipes) A prescription for an FDA-approved tobacco cessation medication was offered at discharge and the patient refused  Current Medications: Current Facility-Administered Medications  Medication Dose Route Frequency Provider  Last Rate Last Dose  . acetaminophen (TYLENOL) tablet 650 mg  650 mg Oral Q6H PRN Derrill Center, NP   650 mg at 02/22/19 1252  . alum & mag hydroxide-simeth (MAALOX/MYLANTA) 200-200-20 MG/5ML suspension 30 mL  30 mL Oral Q4H PRN Derrill Center, NP      . chlordiazePOXIDE (LIBRIUM) capsule 25 mg  25 mg Oral Q6H PRN Caroline Sauger, NP   25 mg at 02/21/19 2303  . hydrOXYzine (ATARAX/VISTARIL) tablet 25 mg  25 mg Oral Q6H PRN Caroline Sauger, NP   25 mg at 02/21/19 1722  . loperamide (IMODIUM) capsule 2-4 mg  2-4 mg Oral PRN Caroline Sauger, NP      . magnesium hydroxide (MILK OF MAGNESIA) suspension 30 mL  30 mL Oral Daily PRN Derrill Center, NP      . multivitamin with minerals tablet 1 tablet  1 tablet Oral Daily Caroline Sauger, NP   1 tablet at 02/22/19 0750  . ondansetron (ZOFRAN-ODT) disintegrating tablet 4 mg  4 mg Oral Q6H PRN Caroline Sauger, NP   4 mg at 02/22/19 1252  . thiamine (B-1) injection 100 mg  100 mg Intramuscular Once Caroline Sauger, NP      . thiamine (VITAMIN B-1) tablet 100 mg  100 mg Oral Daily Caroline Sauger, NP   100 mg at 02/22/19 0750  . traZODone (DESYREL) tablet 50 mg  50 mg Oral QHS PRN Derrill Center, NP   50 mg at 02/21/19 2127   Current Outpatient Medications  Medication Sig Dispense Refill  . famotidine (PEPCID) 20 MG tablet Take 1 tablet (20 mg total) by mouth 2 (two) times daily. 10 tablet 0  . FLUoxetine (PROZAC) 20 MG capsule Take 1 capsule (20 mg total) by mouth daily. 30 capsule 2  . hydrochlorothiazide (HYDRODIURIL) 25 MG tablet Take 1 tablet (25 mg total) by mouth daily. 30 tablet 0  . propranolol (INDERAL) 40 MG tablet Take 1 tablet (40 mg total) by mouth 2 (two) times daily. 60 tablet 2  . topiramate (TOPAMAX) 50 MG tablet Take 1 tablet (50 mg total) by mouth 3 (three) times daily. 90 tablet 1  . traZODone (DESYREL) 100 MG tablet Take 1 tablet (100 mg total) by mouth at bedtime as needed for sleep. 90 tablet 1    PTA Medications: No medications prior to admission.    Musculoskeletal: Strength & Muscle Tone: within normal limits Gait & Station: normal Patient leans: N/A  Psychiatric Specialty Exam: Physical Exam  Nursing note and vitals reviewed. Constitutional: He is oriented to person, place, and time. No distress.  Neck: Normal range of motion.  Respiratory: Effort normal.  Musculoskeletal: Normal range of motion.  Neurological: He is alert and oriented to person, place,  and time.  Skin: Skin is warm and dry.  Psychiatric: His speech is normal and behavior is normal. Judgment and thought content normal. Anxious: Stable. Cognition and memory are normal. Depressed: Stable.    Review of Systems  Psychiatric/Behavioral: Positive for substance abuse. Depression: Stable. Hallucinations: Denies. Memory loss: Denies. Suicidal ideas: Denies. Nervous/anxious: Stable. Insomnia: Denies.   All other systems reviewed and are negative.   Blood pressure 131/80, pulse 70, temperature 98.6 F (37 C), temperature source Oral, resp. rate 16, SpO2 100 %.There is no height or weight on file to calculate BMI.  General Appearance: Casual  Eye Contact:  Good  Speech:  Clear and Coherent and Normal Rate  Volume:  Normal  Mood:  Appropriate  Affect:  Appropriate and Congruent  Thought Process:  Coherent, Goal Directed and Descriptions of Associations: Intact  Orientation:  Full (Time, Place, and Person)  Thought Content:  WDL  Suicidal Thoughts:  No  Homicidal Thoughts:  No  Memory:  Immediate;   Good Recent;   Good Remote;   Good  Judgement:  Intact  Insight:  Present  Psychomotor Activity:  Normal  Concentration:  Concentration: Good and Attention Span: Good  Recall:  Good  Fund of Knowledge:  Good  Language:  Good  Akathisia:  No  Handed:  Right  AIMS (if indicated):   N/A  Assets:  Communication Skills Desire for Improvement  ADL's:  Intact  Cognition:  WNL  Sleep:   N/A      Demographic Factors:  Male  Loss Factors: Financial problems/change in socioeconomic status  Historical Factors: Impulsivity  Risk Reduction Factors:   Religious beliefs about death  Continued Clinical Symptoms:  Alcohol/Substance Abuse/Dependencies Previous Psychiatric Diagnoses and Treatments  Cognitive Features That Contribute To Risk:  None    Suicide Risk:  Minimal: No identifiable suicidal ideation.  Patients presenting with no risk factors but with morbid ruminations; may be classified as minimal risk based on the severity of the depressive symptoms    Plan Of Care/Follow-up recommendations:  Activity:  As tolerated Diet:  Heart healthy Other:  Follow up with resources given   Disposition: No evidence of imminent risk to self or others at present.   Patient does not meet criteria for psychiatric inpatient admission. Supportive therapy provided about ongoing stressors. Discussed crisis plan, support from social network, calling 911, coming to the Emergency Department, and calling Suicide Hotline.  Shuvon Rankin, NP 02/22/2019, 3:03 PM   Patient seen face-to-face for psychiatric evaluation, chart reviewed and case discussed with the physician extender and developed treatment plan. Reviewed the information documented and agree with the treatment plan.  Buford Dresser, DO 02/22/19 6:20 PM

## 2019-02-22 NOTE — Progress Notes (Signed)
D: Pt alert and oriented. Pt denies experiencing any pain, SI/HI, or AVH at this time. Pt reports he will be able to keep himself safe upon discharge. Resource to inpatient and outpatient therapy were given and reviewed.  A: Pt received discharge and medication education/information. Pt belongings were returned and signed for at this time.   R: Pt verbalized understanding of discharge and medication education/information.  Pt was escorted to the obs lobby where he received direction to the nearest public transportation stop.   Pt was also provide opportunity to utilize the phone to make contact with inpatient and outpatient resources prior to discharging.

## 2019-02-22 NOTE — BH Assessment (Signed)
[facility] Assessment Progress Note  Per Buford Dresser, DO, this pt does not require psychiatric hospitalization at this time.  Pt is to be discharged from the [facility] Observation Unit with referral information for area substance abuse treatment providers.  This has been included in pt's discharge instructions.  Pt's nurse, Arlyss Repress, has been notified.  Jalene Mullet, [place] Triage Specialist [phone]

## 2019-02-22 NOTE — Discharge Instructions (Signed)
To help you maintain a sober lifestyle, a substance abuse treatment program may be beneficial to you.  Contact one of the following facilities at your earliest opportunity to ask about enrolling:  RESIDENTIAL PROGRAMS:       [facility]      [place], [place] [address]      [phone]       [facility]      [place], [place] [address]      [phone]  OUTPATIENT PROGRAMS:       [facility]      [place],  [id number]      [phone]

## 2019-02-22 NOTE — Progress Notes (Signed)
[place] NOVEL CORONAVIRUS (COVID-19) DAILY CHECK-OFF SYMPTOMS - answer yes or no to each - every day NO YES  Have you had a fever in the past 24 hours?  . Fever (Temp > 37.80C / 100F) X   Have you had any of these symptoms in the past 24 hours? . New Cough .  Sore Throat  .  Shortness of Breath .  Difficulty Breathing .  Unexplained Body Aches   X   Have you had any one of these symptoms in the past 24 hours not related to allergies?   . Runny Nose .  Nasal Congestion .  Sneezing   X   If you have had runny nose, nasal congestion, sneezing in the past 24 hours, has it worsened?  X   EXPOSURES - check yes or no X   Have you traveled outside the state in the past 14 days?  X   Have you been in contact with someone with a confirmed diagnosis of COVID-19 or PUI in the past 14 days without wearing appropriate PPE?  X   Have you been living in the same home as a person with confirmed diagnosis of COVID-19 or a PUI (household contact)?    X   Have you been diagnosed with COVID-19?    X              What to do next: Answered NO to all: Answered YES to anything:   Proceed with unit schedule Follow the [facility] Inpatient Flowsheet.   

## 2019-09-07 ENCOUNTER — Ambulatory Visit (HOSPITAL_COMMUNITY)
Admission: EM | Admit: 2019-09-07 | Discharge: 2019-09-07 | Disposition: A | Discharge status: Home / Self Care | Payer: Self-pay | Attending: Family Medicine | Admitting: Family Medicine

## 2019-09-07 ENCOUNTER — Encounter (HOSPITAL_COMMUNITY): Payer: Self-pay

## 2019-09-07 ENCOUNTER — Other Ambulatory Visit: Payer: Self-pay

## 2019-09-07 DIAGNOSIS — M5442 Lumbago with sciatica, left side: Secondary | ICD-10-CM

## 2019-09-07 MED ORDER — KETOROLAC TROMETHAMINE 30 MG/ML IJ SOLN
30.0000 mg | Freq: Once | INTRAMUSCULAR | Status: AC
  Administered 2019-09-07: 11:00:00 30 mg via INTRAMUSCULAR

## 2019-09-07 MED ORDER — PREDNISONE 10 MG PO TABS: ORAL_TABLET | ORAL | 0 refills | Status: DC

## 2019-09-07 MED ORDER — KETOROLAC TROMETHAMINE 30 MG/ML IJ SOLN
INTRAMUSCULAR | Status: AC
  Filled 2019-09-07: qty 1

## 2019-09-07 NOTE — ED Triage Notes (Signed)
Pt state he has  sciatic pain in his lower back. Pt states this started yesterday morning. Pt states he has a headache as well. He would like to get a work note.

## 2019-09-07 NOTE — ED Provider Notes (Signed)
[facility]    CSN: [id number] Arrival date & time: 09/07/19  [id number]      History   Chief Complaint Chief Complaint  Patient presents with  . Back Pain    HPI Alvin Warren is a 59 y.o. male history of hypertension, polysubstance abuse, presenting today for evaluation of back pain.  Patient states that yesterday morning he woke up with left-sided lower back pain.  Pain radiates into left leg with a burning/numbness tingling sensation.  Has history of similar.  Denies any injury or fall.  Denies any urinary symptoms of dysuria or hematuria.  Denies difficulty controlling urination or bowels.  Has had some urinary urgency that has been going on prior to onset of back pain.  Denies fevers.  Tried Epsom baths, muscle rub, ice and Tylenol without relief.  HPI  Past Medical History:  Diagnosis Date  . Alcohol abuse   . Alcohol withdrawal seizure ([place])   . Drug abuse and dependence ([place])   . Hypertension     Patient Active Problem List   Diagnosis Date Noted  . MDD (major depressive disorder) 02/04/2019  . Essential hypertension 12/07/2018  . Tobacco abuse 12/07/2018  . ETOH abuse 12/07/2018  . Angioedema of lips 12/07/2018  . Acute kidney injury ([place]) 12/06/2018  . MDD (major depressive disorder), recurrent episode, severe ([place]) 11/07/2018  . Cocaine abuse ([place]) 11/07/2018    Past Surgical History:  Procedure Laterality Date  . BACK SURGERY         Home Medications    Prior to Admission medications   Medication Sig Start Date End Date Taking? Authorizing Provider  famotidine (PEPCID) 20 MG tablet Take 1 tablet (20 mg total) by mouth 2 (two) times daily. 12/07/18   Dhungel, Nishant, MD  FLUoxetine (PROZAC) 20 MG capsule Take 1 capsule (20 mg total) by mouth daily. 02/09/19   Johnn Hai, MD  hydrochlorothiazide (HYDRODIURIL) 25 MG tablet Take 1 tablet (25 mg total) by mouth daily. 12/09/18   Dhungel, Flonnie Overman, MD  predniSONE (DELTASONE) 10 MG tablet Take 6  tabs/60 mg for first 2 days, 5 tabs the next 2 days, 4 tabs for 2 days, 3 tabs for 2 days, 2 tabs for 2 days, 1 tab for 2 days 09/07/19   Anne Boltz C, PA-C  propranolol (INDERAL) 40 MG tablet Take 1 tablet (40 mg total) by mouth 2 (two) times daily. 11/11/18   Johnn Hai, MD  topiramate (TOPAMAX) 50 MG tablet Take 1 tablet (50 mg total) by mouth 3 (three) times daily. 02/08/19   Johnn Hai, MD  traZODone (DESYREL) 100 MG tablet Take 1 tablet (100 mg total) by mouth at bedtime as needed for sleep. 11/11/18   Johnn Hai, MD    Family History Family History  Problem Relation Age of Onset  . Heart failure Mother     Social History Social History   Tobacco Use  . Smoking status: Current Every Day Smoker    Packs/day: 0.50    Types: Cigarettes  . Smokeless tobacco: Never Used  Substance Use Topics  . Alcohol use: Yes    Comment: pt drink 4 40s a day  . Drug use: Yes    Types: "Crack" cocaine, Cocaine    Comment: Not sure of last use     Allergies   Lisinopril   Review of Systems Review of Systems  Constitutional: Negative for fatigue and fever.  Eyes: Negative for redness, itching and visual disturbance.  Respiratory: Negative for shortness of  breath.   Cardiovascular: Negative for chest pain and leg swelling.  Gastrointestinal: Negative for nausea and vomiting.  Genitourinary: Negative for decreased urine volume and difficulty urinating.  Musculoskeletal: Positive for back pain and myalgias. Negative for arthralgias.  Skin: Negative for color change, rash and wound.  Neurological: Positive for headaches. Negative for dizziness, syncope, weakness and light-headedness.     Physical Exam Triage Vital Signs ED Triage Vitals  Enc Vitals Group     BP 09/07/19 1008 (!) 144/100     Pulse Rate 09/07/19 1008 78     Resp 09/07/19 1008 18     Temp 09/07/19 1008 98.3 F (36.8 C)     Temp Source 09/07/19 1008 Oral     SpO2 09/07/19 1008 100 %     Weight 09/07/19 1009  235 lb (106.6 kg)     Height --      Head Circumference --      Peak Flow --      Pain Score 09/07/19 1009 10     Pain Loc --      Pain Edu? --      Excl. in [place]? --    No data found.  Updated Vital Signs BP (!) 144/100 (BP Location: Right Arm)   Pulse 78   Temp 98.3 F (36.8 C) (Oral)   Resp 18   Wt 235 lb (106.6 kg)   SpO2 100%   BMI 30.17 kg/m   Visual Acuity Right Eye Distance:   Left Eye Distance:   Bilateral Distance:    Right Eye Near:   Left Eye Near:    Bilateral Near:     Physical Exam Vitals and nursing note reviewed.  Constitutional:      Appearance: He is well-developed.     Comments: No acute distress  HENT:     Head: Normocephalic and atraumatic.     Nose: Nose normal.  Eyes:     Conjunctiva/sclera: Conjunctivae normal.  Cardiovascular:     Rate and Rhythm: Normal rate.  Pulmonary:     Effort: Pulmonary effort is normal. No respiratory distress.  Abdominal:     General: There is no distension.  Musculoskeletal:        General: Normal range of motion.     Cervical back: Neck supple.     Comments: Lying on right side to avoid pressure on left  Back: Nontender to palpation of thoracic and upper lumbar spine midline, tenderness to palpation of lower lumbar spine midline, increased tenderness throughout left lumbar paraspinal and lateral musculature; positive straight leg raise on left Does have slightly decreased strength with hip flexion on left 4/5 compared to 5/5 on right.  Abduction and adduction equal.  Skin:    General: Skin is warm and dry.  Neurological:     Mental Status: He is alert and oriented to person, place, and time.      UC Treatments / Results  Labs (all labs ordered are listed, but only abnormal results are displayed) Labs Reviewed - No data to display  EKG   Radiology No results found.  Procedures Procedures (including critical care time)  Medications Ordered in UC Medications  ketorolac (TORADOL) 30 MG/ML  injection 30 mg (has no administration in time range)    Initial Impression / Assessment and Plan / UC Course  I have reviewed the triage vital signs and the nursing notes.  Pertinent labs & imaging results that were available during my care of the patient were reviewed by  me and considered in my medical decision making (see chart for details).     History and exam suggestive of sciatica.  No red flags for cauda equina at this time.  Will provide Toradol IM today followed by 12-day prednisone taper.  Patient wishes to avoid muscle relaxers as he believes he is not allowed in his program he is currently in. Discussed activity modification, continue to monitor,Discussed strict return precautions. Patient verbalized understanding and is agreeable with plan.  Final Clinical Impressions(s) / UC Diagnoses   Final diagnoses:  Acute left-sided low back pain with left-sided sciatica     Discharge Instructions     We gave you an injection of Toradol today Begin prednisone taper beginning with 6 tablets for the first 2 days, decrease by 1 tablet every 2 days until complete. 6,6,5,5,4,4,3,3,2,2,1,1 Please take with food and in the morning if you are able  Please follow-up if pain not improving or worsening, developing leg weakness issues controlling urination or bowel movements   ED Prescriptions    Medication Sig Dispense Auth. Provider   predniSONE (DELTASONE) 10 MG tablet Take 6 tabs/60 mg for first 2 days, 5 tabs the next 2 days, 4 tabs for 2 days, 3 tabs for 2 days, 2 tabs for 2 days, 1 tab for 2 days 42 tablet Donna Silverman C, PA-C     I have reviewed the PDMP during this encounter.   Joneen Caraway [place] C, PA-C 09/07/19 1106

## 2019-09-07 NOTE — Discharge Instructions (Signed)
We gave you an injection of Toradol today Begin prednisone taper beginning with 6 tablets for the first 2 days, decrease by 1 tablet every 2 days until complete. 6,6,5,5,4,4,3,3,2,2,1,1 Please take with food and in the morning if you are able  Please follow-up if pain not improving or worsening, developing leg weakness issues controlling urination or bowel movements

## 2019-09-10 ENCOUNTER — Ambulatory Visit (HOSPITAL_COMMUNITY)
Admission: EM | Admit: 2019-09-10 | Discharge: 2019-09-10 | Disposition: A | Discharge status: Home / Self Care | Payer: Self-pay | Attending: Urgent Care | Admitting: Urgent Care

## 2019-09-10 ENCOUNTER — Encounter (HOSPITAL_COMMUNITY): Payer: Self-pay | Admitting: Emergency Medicine

## 2019-09-10 ENCOUNTER — Other Ambulatory Visit: Payer: Self-pay

## 2019-09-10 DIAGNOSIS — M5432 Sciatica, left side: Secondary | ICD-10-CM

## 2019-09-10 DIAGNOSIS — R29898 Other symptoms and signs involving the musculoskeletal system: Secondary | ICD-10-CM

## 2019-09-10 DIAGNOSIS — M62830 Muscle spasm of back: Secondary | ICD-10-CM

## 2019-09-10 LAB — POCT URINALYSIS DIP (DEVICE)
Bilirubin Urine: NEGATIVE
Glucose, UA: NEGATIVE mg/dL
Hgb urine dipstick: NEGATIVE
Ketones, ur: NEGATIVE mg/dL
Leukocytes,Ua: NEGATIVE
Nitrite: NEGATIVE
Protein, ur: NEGATIVE mg/dL
Specific Gravity, Urine: 1.025 (ref 1.005–1.030)
Urobilinogen, UA: 0.2 mg/dL (ref 0.0–1.0)
pH: 6 (ref 5.0–8.0)

## 2019-09-10 MED ORDER — TIZANIDINE HCL 4 MG PO TABS: 4.0000 mg | ORAL_TABLET | Freq: Three times a day (TID) | ORAL | 0 refills | Status: DC | PRN

## 2019-09-10 MED ORDER — GABAPENTIN 300 MG PO CAPS: 300.0000 mg | ORAL_CAPSULE | Freq: Three times a day (TID) | ORAL | 0 refills | Status: DC

## 2019-09-10 MED ORDER — METHYLPREDNISOLONE SODIUM SUCC 125 MG IJ SOLR
125.0000 mg | Freq: Once | INTRAMUSCULAR | Status: AC
  Administered 2019-09-10: 13:00:00 125 mg via INTRAMUSCULAR

## 2019-09-10 MED ORDER — METHYLPREDNISOLONE SODIUM SUCC 125 MG IJ SOLR
INTRAMUSCULAR | Status: AC
  Filled 2019-09-10: qty 2

## 2019-09-10 NOTE — ED Provider Notes (Signed)
[place]   MRN: [id number] DOB: 07-Nov-1960  Subjective:   Alvin Warren is a 59 y.o. male presenting for recheck on persistent back and left leg pain.  Symptoms are primarily numbness, tingling and pains.  Last office visit was on 09/07/2019.  He was started on an oral steroid course, received IM Toradol.  He also obtained a note for work.  However, this presented in need for him to get clearance to return to work.  Patient is not insured, he does not have a primary care doctor.  He is currently in a drug rehabilitation program and has been told that he cannot take muscle relaxants as a part of the program.  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  Denies falls, trauma.  Denies work injury.  Denies hematuria.  States that he has weakness from his pain.  The pain itself is more upper part of his buttock on the lateral side extending down across the lateral side of his hip and left thigh.  No current facility-administered medications for this encounter.  Current Outpatient Medications:  .  famotidine (PEPCID) 20 MG tablet, Take 1 tablet (20 mg total) by mouth 2 (two) times daily., Disp: 10 tablet, Rfl: 0 .  FLUoxetine (PROZAC) 20 MG capsule, Take 1 capsule (20 mg total) by mouth daily., Disp: 30 capsule, Rfl: 2 .  hydrochlorothiazide (HYDRODIURIL) 25 MG tablet, Take 1 tablet (25 mg total) by mouth daily., Disp: 30 tablet, Rfl: 0 .  predniSONE (DELTASONE) 10 MG tablet, Take 6 tabs/60 mg for first 2 days, 5 tabs the next 2 days, 4 tabs for 2 days, 3 tabs for 2 days, 2 tabs for 2 days, 1 tab for 2 days, Disp: 42 tablet, Rfl: 0 .  propranolol (INDERAL) 40 MG tablet, Take 1 tablet (40 mg total) by mouth 2 (two) times daily., Disp: 60 tablet, Rfl: 2 .  topiramate (TOPAMAX) 50 MG tablet, Take 1 tablet (50 mg total) by mouth 3 (three) times daily., Disp: 90 tablet, Rfl: 1 .  traZODone (DESYREL) 100 MG tablet, Take 1 tablet (100 mg total) by mouth at bedtime as needed  for sleep., Disp: 90 tablet, Rfl: 1   Allergies  Allergen Reactions  . Lisinopril     Angioedema. SHOULD NOT be on ACEi or ARB for life.    Past Medical History:  Diagnosis Date  . Alcohol abuse   . Alcohol withdrawal seizure ([place])   . Drug abuse and dependence ([place])   . Hypertension      Past Surgical History:  Procedure Laterality Date  . BACK SURGERY      Family History  Problem Relation Age of Onset  . Heart failure Mother     Social History   Tobacco Use  . Smoking status: Current Every Day Smoker    Packs/day: 0.50    Types: Cigarettes  . Smokeless tobacco: Never Used  Substance Use Topics  . Alcohol use: Yes    Comment: pt drink 4 40s a day  . Drug use: Yes    Types: "Crack" cocaine, Cocaine    Comment: Not sure of last use    ROS   Objective:   Vitals: BP (!) 146/101   Pulse 67   Temp 98 F (36.7 C)   Resp 18   SpO2 100%   Physical Exam Constitutional:      General: He is not in acute distress.    Appearance: Normal appearance. He is well-developed and  normal weight. He is not ill-appearing, toxic-appearing or diaphoretic.  HENT:     Head: Normocephalic and atraumatic.     Right Ear: External ear normal.     Left Ear: External ear normal.     Nose: Nose normal.     Mouth/Throat:     Pharynx: Oropharynx is clear.  Eyes:     General: No scleral icterus.       Right eye: No discharge.        Left eye: No discharge.     Extraocular Movements: Extraocular movements intact.     Pupils: Pupils are equal, round, and reactive to light.  Cardiovascular:     Rate and Rhythm: Normal rate.  Pulmonary:     Effort: Pulmonary effort is normal.  Musculoskeletal:     Cervical back: Normal range of motion.     Lumbar back: No swelling, edema, deformity, signs of trauma, lacerations, spasms, tenderness or bony tenderness. Decreased range of motion (Full flexion and extension). Positive left straight leg raise test. Negative right straight leg raise  test. No scoliosis.       Legs:     Comments: Patient frequently changes position for relief.  Strength on extension of his left thigh and lower leg limited due to pain.  Neurological:     Mental Status: He is alert and oriented to person, place, and time.  Psychiatric:        Mood and Affect: Mood normal.        Behavior: Behavior normal.        Thought Content: Thought content normal.        Judgment: Judgment normal.     Results for orders placed or performed during the hospital encounter of 09/10/19 (from the past 24 hour(s))  POCT urinalysis dip (device)     Status: None   Collection Time: 09/10/19  1:03 PM  Result Value Ref Range   Glucose, UA NEGATIVE NEGATIVE mg/dL   Bilirubin Urine NEGATIVE NEGATIVE   Ketones, ur NEGATIVE NEGATIVE mg/dL   Specific Gravity, Urine 1.025 1.005 - 1.030   Hgb urine dipstick NEGATIVE NEGATIVE   pH 6.0 5.0 - 8.0   Protein, ur NEGATIVE NEGATIVE mg/dL   Urobilinogen, UA 0.2 0.0 - 1.0 mg/dL   Nitrite NEGATIVE NEGATIVE   Leukocytes,Ua NEGATIVE NEGATIVE    Assessment and Plan :   1. Sciatica of left side   2. Muscle spasm of back   3. Left leg weakness     Discussed with patient need for an MRI.  He had an x-ray of lumbar region on May 2020.  His symptoms are largely neurologic.  Will use IM Solu-Medrol in clinic today.  Recommended starting gabapentin as well for neuropathic type pain.  Provided patient with a note for his rehabilitation program to be able to use Zanaflex.  Counseled on back care.  Emphasized need to establish care with a PCP through [facility] internal medicine.  Also emphasized need to follow-up with [place] [facility] to pursue further imaging.  I completed his paperwork today but emphasized that he would need to have clearance to return to work through the neurologist/neurosurgeon, his PCP or occupational health. Counseled patient on potential for adverse effects with medications prescribed/recommended today, ER and  return-to-clinic precautions discussed, patient verbalized understanding.    Jaynee Eagles, [place] 09/10/19 1342

## 2019-09-10 NOTE — ED Triage Notes (Signed)
Pt c/o continued sciatic pain. States he was supposed to go to work today but he cant, he is still in pain. Pt also requesting forms filled out for returning to work. Does not have insurance or a PCP.

## 2019-09-10 NOTE — Discharge Instructions (Addendum)
Make sure you try to establish care with [facility] internal medicine.  They can help you find a regular doctor to help you complete all your paperwork.  I also want to emphasize the need to follow-up with [place] [facility] as I am afraid you will be needing more imaging for your back such as an MRI.  In the meantime finish her oral prednisone course and start using the muscle relaxant I prescribed.  If you develop weakness, inability to use one of your legs or both, start soiling or lose control of your ability to urinate then please report to the emergency room.

## 2019-12-27 ENCOUNTER — Ambulatory Visit (HOSPITAL_COMMUNITY): Payer: Self-pay | Admitting: Clinical

## 2019-12-30 ENCOUNTER — Other Ambulatory Visit: Payer: Self-pay

## 2019-12-30 ENCOUNTER — Ambulatory Visit (HOSPITAL_COMMUNITY): Payer: Self-pay | Admitting: Clinical

## 2019-12-31 ENCOUNTER — Ambulatory Visit (HOSPITAL_COMMUNITY): Payer: Self-pay | Admitting: Clinical

## 2019-12-31 ENCOUNTER — Other Ambulatory Visit: Payer: Self-pay

## 2020-01-14 ENCOUNTER — Ambulatory Visit (HOSPITAL_COMMUNITY): Payer: Self-pay | Admitting: Clinical

## 2020-01-14 ENCOUNTER — Other Ambulatory Visit: Payer: Self-pay

## 2020-01-17 ENCOUNTER — Emergency Department (HOSPITAL_COMMUNITY): Payer: Self-pay

## 2020-01-17 ENCOUNTER — Encounter (HOSPITAL_COMMUNITY): Payer: Self-pay | Admitting: Pediatrics

## 2020-01-17 ENCOUNTER — Other Ambulatory Visit: Payer: Self-pay

## 2020-01-17 ENCOUNTER — Emergency Department (HOSPITAL_COMMUNITY)
Admission: EM | Admit: 2020-01-17 | Discharge: 2020-01-19 | Disposition: A | Discharge status: Other Acute Inpatient Hospital | Payer: Self-pay | Attending: Emergency Medicine | Admitting: Emergency Medicine

## 2020-01-17 DIAGNOSIS — R45851 Suicidal ideations: Secondary | ICD-10-CM | POA: Insufficient documentation

## 2020-01-17 DIAGNOSIS — R0789 Other chest pain: Secondary | ICD-10-CM | POA: Insufficient documentation

## 2020-01-17 DIAGNOSIS — Y905 Blood alcohol level of 100-119 mg/100 ml: Secondary | ICD-10-CM | POA: Insufficient documentation

## 2020-01-17 DIAGNOSIS — R0602 Shortness of breath: Secondary | ICD-10-CM | POA: Insufficient documentation

## 2020-01-17 DIAGNOSIS — F1910 Other psychoactive substance abuse, uncomplicated: Secondary | ICD-10-CM | POA: Insufficient documentation

## 2020-01-17 DIAGNOSIS — F10229 Alcohol dependence with intoxication, unspecified: Secondary | ICD-10-CM | POA: Insufficient documentation

## 2020-01-17 DIAGNOSIS — R05 Cough: Secondary | ICD-10-CM | POA: Insufficient documentation

## 2020-01-17 DIAGNOSIS — I10 Essential (primary) hypertension: Secondary | ICD-10-CM | POA: Insufficient documentation

## 2020-01-17 DIAGNOSIS — Z20822 Contact with and (suspected) exposure to covid-19: Secondary | ICD-10-CM | POA: Insufficient documentation

## 2020-01-17 DIAGNOSIS — F332 Major depressive disorder, recurrent severe without psychotic features: Secondary | ICD-10-CM | POA: Insufficient documentation

## 2020-01-17 LAB — SARS CORONAVIRUS 2 BY RT PCR (HOSPITAL ORDER, PERFORMED IN ~~LOC~~ HOSPITAL LAB): SARS Coronavirus 2: NEGATIVE

## 2020-01-17 LAB — TROPONIN I (HIGH SENSITIVITY)
Troponin I (High Sensitivity): 9 ng/L (ref <18)
Troponin I (High Sensitivity): 9 ng/L (ref <18)

## 2020-01-17 LAB — COMPREHENSIVE METABOLIC PANEL
ALT: 20 U/L (ref 0–44)
AST: 34 U/L (ref 15–41)
Albumin: 3.4 g/dL — ABNORMAL LOW (ref 3.5–5.0)
Alkaline Phosphatase: 37 U/L — ABNORMAL LOW (ref 38–126)
Anion gap: 17 — ABNORMAL HIGH (ref 5–15)
BUN: 9 mg/dL (ref 6–20)
CO2: 17 mmol/L — ABNORMAL LOW (ref 22–32)
Calcium: 8.9 mg/dL (ref 8.9–10.3)
Chloride: 105 mmol/L (ref 98–111)
Creatinine, Ser: 1.23 mg/dL (ref 0.61–1.24)
GFR calc Af Amer: >60 mL/min (ref >60)
GFR calc non Af Amer: >60 mL/min (ref >60)
Glucose, Bld: 82 mg/dL (ref 70–99)
Potassium: 4.4 mmol/L (ref 3.5–5.1)
Sodium: 139 mmol/L (ref 135–145)
Total Bilirubin: 0.6 mg/dL (ref 0.3–1.2)
Total Protein: 7.2 g/dL (ref 6.5–8.1)

## 2020-01-17 LAB — CBC
HCT: 44.8 % (ref 39.0–52.0)
Hemoglobin: 15 g/dL (ref 13.0–17.0)
MCH: 29.1 pg (ref 26.0–34.0)
MCHC: 33.5 g/dL (ref 30.0–36.0)
MCV: 87 fL (ref 80.0–100.0)
Platelets: 240 10*3/uL (ref 150–400)
RBC: 5.15 MIL/uL (ref 4.22–5.81)
RDW: 13.1 % (ref 11.5–15.5)
WBC: 7.5 10*3/uL (ref 4.0–10.5)
nRBC: 0 % (ref 0.0–0.2)

## 2020-01-17 LAB — SALICYLATE LEVEL: Salicylate Lvl: <7 mg/dL — ABNORMAL LOW (ref 7.0–30.0)

## 2020-01-17 LAB — ETHANOL: Alcohol, Ethyl (B): 117 mg/dL — ABNORMAL HIGH (ref <10)

## 2020-01-17 LAB — ACETAMINOPHEN LEVEL: Acetaminophen (Tylenol), Serum: <10 ug/mL — ABNORMAL LOW (ref 10–30)

## 2020-01-17 MED ORDER — LORAZEPAM 2 MG/ML IJ SOLN
1.0000 mg | Freq: Once | INTRAMUSCULAR | Status: AC
  Administered 2020-01-17: 1 mg via INTRAVENOUS
  Filled 2020-01-17: qty 1

## 2020-01-17 MED ORDER — THIAMINE HCL 100 MG/ML IJ SOLN: 100.0000 mg | Freq: Every day | INTRAMUSCULAR | Status: DC

## 2020-01-17 MED ORDER — HYDROCHLOROTHIAZIDE 25 MG PO TABS
25.0000 mg | ORAL_TABLET | Freq: Every day | ORAL | Status: DC
  Administered 2020-01-17 – 2020-01-19 (×3): 25 mg via ORAL
  Filled 2020-01-17 (×3): qty 1

## 2020-01-17 MED ORDER — FLUOXETINE HCL 20 MG PO CAPS
20.0000 mg | ORAL_CAPSULE | Freq: Every day | ORAL | Status: DC
  Administered 2020-01-17 – 2020-01-19 (×3): 20 mg via ORAL
  Filled 2020-01-17 (×3): qty 1

## 2020-01-17 MED ORDER — THIAMINE HCL 100 MG PO TABS
100.0000 mg | ORAL_TABLET | Freq: Every day | ORAL | Status: DC
  Administered 2020-01-17 – 2020-01-19 (×3): 100 mg via ORAL
  Filled 2020-01-17 (×3): qty 1

## 2020-01-17 MED ORDER — LORAZEPAM 1 MG PO TABS: 0.0000 mg | ORAL_TABLET | Freq: Two times a day (BID) | ORAL | Status: DC

## 2020-01-17 MED ORDER — LOSARTAN POTASSIUM 50 MG PO TABS
25.0000 mg | ORAL_TABLET | Freq: Every day | ORAL | Status: DC
  Administered 2020-01-17 – 2020-01-19 (×3): 25 mg via ORAL
  Filled 2020-01-17 (×3): qty 1

## 2020-01-17 MED ORDER — HYDROXYZINE HCL 50 MG PO TABS
50.0000 mg | ORAL_TABLET | Freq: Every evening | ORAL | Status: DC | PRN
  Administered 2020-01-17 – 2020-01-18 (×2): 50 mg via ORAL
  Filled 2020-01-17 (×2): qty 1

## 2020-01-17 MED ORDER — LORAZEPAM 2 MG/ML IJ SOLN
0.0000 mg | Freq: Four times a day (QID) | INTRAMUSCULAR | Status: AC
  Administered 2020-01-17: 1 mg via INTRAVENOUS
  Filled 2020-01-17: qty 1

## 2020-01-17 MED ORDER — GABAPENTIN 300 MG PO CAPS
300.0000 mg | ORAL_CAPSULE | Freq: Three times a day (TID) | ORAL | Status: DC
  Administered 2020-01-17 – 2020-01-19 (×7): 300 mg via ORAL
  Filled 2020-01-17 (×7): qty 1

## 2020-01-17 MED ORDER — TRAZODONE HCL 100 MG PO TABS
100.0000 mg | ORAL_TABLET | Freq: Every evening | ORAL | Status: DC | PRN
  Administered 2020-01-17 – 2020-01-18 (×2): 100 mg via ORAL
  Filled 2020-01-17 (×2): qty 1

## 2020-01-17 MED ORDER — LORAZEPAM 2 MG/ML IJ SOLN: 0.0000 mg | Freq: Two times a day (BID) | INTRAMUSCULAR | Status: DC

## 2020-01-17 MED ORDER — LORAZEPAM 1 MG PO TABS: 0.0000 mg | ORAL_TABLET | Freq: Four times a day (QID) | ORAL | Status: AC

## 2020-01-17 NOTE — ED Notes (Signed)
Pt in TTS in Rm 58
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
## 2020-01-17 NOTE — ED Notes (Signed)
Patient eating lunch tray.

## 2020-01-17 NOTE — ED Notes (Signed)
Belongings inventoried and placed in locker #11 
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
## 2020-01-17 NOTE — ED Notes (Signed)
Informed Audrey - RN of pt's BP.

## 2020-01-17 NOTE — Care Management (Signed)
Writer faxed referral to the following facilities:  [facility]  [facility]  [place]  [facility]  [facility]  [facility]
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
## 2020-01-17 NOTE — ED Notes (Signed)
Ordered pt's dinner 

## 2020-01-18 ENCOUNTER — Emergency Department (HOSPITAL_COMMUNITY): Payer: Self-pay

## 2020-01-18 LAB — RAPID URINE DRUG SCREEN, HOSP PERFORMED
Amphetamines: NOT DETECTED
Barbiturates: NOT DETECTED
Benzodiazepines: POSITIVE — AB
Cocaine: POSITIVE — AB
Opiates: NOT DETECTED
Tetrahydrocannabinol: NOT DETECTED

## 2020-01-18 MED ORDER — TETRACAINE HCL 0.5 % OP SOLN
2.0000 [drp] | Freq: Once | OPHTHALMIC | Status: AC
  Administered 2020-01-18: 2 [drp] via OPHTHALMIC
  Filled 2020-01-18: qty 4

## 2020-01-18 NOTE — ED Notes (Signed)
Pt would not answer if SI/HI this am - Stated "I've got to talk to the psychiatrist about it".

## 2020-01-18 NOTE — ED Notes (Signed)
Pt being transported to CT via w/c. Sitter w/pt.

## 2020-01-18 NOTE — ED Notes (Signed)
Pt returned from CT. Sitter w/pt.
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
## 2020-01-18 NOTE — ED Notes (Signed)
Serita Grit, APP, in w/pt.

## 2020-01-18 NOTE — ED Notes (Signed)
Pt aware ophthalmologist being consulted. Re-TTS being performed.

## 2020-01-18 NOTE — ED Notes (Signed)
Pt in shower.  
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
## 2020-01-19 ENCOUNTER — Encounter (HOSPITAL_COMMUNITY): Payer: Self-pay | Admitting: Psychiatric/Mental Health

## 2020-01-19 ENCOUNTER — Inpatient Hospital Stay (HOSPITAL_COMMUNITY)
Admission: EM | Admit: 2020-01-19 | Discharge: 2020-01-25 | DRG: 885 | Disposition: A | Discharge status: Home / Self Care | Payer: Federal, State, Local not specified - Other | Source: Intra-hospital | Attending: Psychiatry | Admitting: Psychiatry

## 2020-01-19 ENCOUNTER — Other Ambulatory Visit: Payer: Self-pay

## 2020-01-19 DIAGNOSIS — G4700 Insomnia, unspecified: Secondary | ICD-10-CM | POA: Diagnosis present

## 2020-01-19 DIAGNOSIS — F332 Major depressive disorder, recurrent severe without psychotic features: Secondary | ICD-10-CM | POA: Diagnosis present

## 2020-01-19 DIAGNOSIS — Z590 Homelessness: Secondary | ICD-10-CM | POA: Diagnosis not present

## 2020-01-19 DIAGNOSIS — F17210 Nicotine dependence, cigarettes, uncomplicated: Secondary | ICD-10-CM | POA: Diagnosis present

## 2020-01-19 DIAGNOSIS — S0285XA Fracture of orbit, unspecified, initial encounter for closed fracture: Secondary | ICD-10-CM | POA: Diagnosis present

## 2020-01-19 DIAGNOSIS — F419 Anxiety disorder, unspecified: Secondary | ICD-10-CM | POA: Diagnosis present

## 2020-01-19 DIAGNOSIS — Z79899 Other long term (current) drug therapy: Secondary | ICD-10-CM

## 2020-01-19 DIAGNOSIS — R45851 Suicidal ideations: Secondary | ICD-10-CM | POA: Diagnosis present

## 2020-01-19 DIAGNOSIS — I10 Essential (primary) hypertension: Secondary | ICD-10-CM | POA: Diagnosis present

## 2020-01-19 DIAGNOSIS — R079 Chest pain, unspecified: Secondary | ICD-10-CM | POA: Diagnosis present

## 2020-01-19 DIAGNOSIS — R21 Rash and other nonspecific skin eruption: Secondary | ICD-10-CM | POA: Diagnosis present

## 2020-01-19 DIAGNOSIS — F10239 Alcohol dependence with withdrawal, unspecified: Secondary | ICD-10-CM | POA: Diagnosis present

## 2020-01-19 DIAGNOSIS — Y905 Blood alcohol level of 100-119 mg/100 ml: Secondary | ICD-10-CM | POA: Diagnosis present

## 2020-01-19 MED ORDER — ACETAMINOPHEN 325 MG PO TABS
650.0000 mg | ORAL_TABLET | Freq: Four times a day (QID) | ORAL | Status: DC | PRN
  Administered 2020-01-20 – 2020-01-25 (×6): 650 mg via ORAL
  Filled 2020-01-19 (×6): qty 2

## 2020-01-19 MED ORDER — LOSARTAN POTASSIUM 25 MG PO TABS
25.0000 mg | ORAL_TABLET | Freq: Every day | ORAL | Status: DC
  Administered 2020-01-20 – 2020-01-25 (×6): 25 mg via ORAL
  Filled 2020-01-19 (×5): qty 1
  Filled 2020-01-19: qty 7
  Filled 2020-01-19 (×2): qty 1

## 2020-01-19 MED ORDER — ADULT MULTIVITAMIN W/MINERALS CH
1.0000 | ORAL_TABLET | Freq: Every day | ORAL | Status: DC
  Administered 2020-01-20 – 2020-01-25 (×6): 1 via ORAL
  Filled 2020-01-19 (×6): qty 1
  Filled 2020-01-19: qty 7
  Filled 2020-01-19 (×2): qty 1

## 2020-01-19 MED ORDER — LORAZEPAM 1 MG PO TABS: 1.0000 mg | ORAL_TABLET | Freq: Four times a day (QID) | ORAL | Status: AC | PRN

## 2020-01-19 MED ORDER — HYDROXYZINE HCL 50 MG PO TABS
50.0000 mg | ORAL_TABLET | Freq: Three times a day (TID) | ORAL | Status: DC | PRN
  Administered 2020-01-20 – 2020-01-25 (×10): 50 mg via ORAL
  Filled 2020-01-19 (×10): qty 1

## 2020-01-19 MED ORDER — GABAPENTIN 300 MG PO CAPS
300.0000 mg | ORAL_CAPSULE | Freq: Three times a day (TID) | ORAL | Status: DC
  Administered 2020-01-20 – 2020-01-25 (×17): 300 mg via ORAL
  Filled 2020-01-19 (×7): qty 1
  Filled 2020-01-19: qty 21
  Filled 2020-01-19 (×8): qty 1
  Filled 2020-01-19 (×2): qty 21
  Filled 2020-01-19 (×5): qty 1

## 2020-01-19 MED ORDER — ALUM & MAG HYDROXIDE-SIMETH 200-200-20 MG/5ML PO SUSP: 30.0000 mL | ORAL | Status: DC | PRN

## 2020-01-19 MED ORDER — HYDROCHLOROTHIAZIDE 25 MG PO TABS
25.0000 mg | ORAL_TABLET | Freq: Every day | ORAL | Status: DC
  Administered 2020-01-20 – 2020-01-25 (×6): 25 mg via ORAL
  Filled 2020-01-19 (×6): qty 1
  Filled 2020-01-19: qty 7
  Filled 2020-01-19: qty 1

## 2020-01-19 MED ORDER — LOPERAMIDE HCL 2 MG PO CAPS: 2.0000 mg | ORAL_CAPSULE | ORAL | Status: AC | PRN

## 2020-01-19 MED ORDER — THIAMINE HCL 100 MG PO TABS
100.0000 mg | ORAL_TABLET | Freq: Every day | ORAL | Status: DC
  Administered 2020-01-20 – 2020-01-25 (×6): 100 mg via ORAL
  Filled 2020-01-19 (×6): qty 1
  Filled 2020-01-19: qty 7
  Filled 2020-01-19: qty 1

## 2020-01-19 MED ORDER — MAGNESIUM HYDROXIDE 400 MG/5ML PO SUSP: 30.0000 mL | Freq: Every day | ORAL | Status: DC | PRN

## 2020-01-19 MED ORDER — FLUOXETINE HCL 20 MG PO CAPS: 30.0000 mg | ORAL_CAPSULE | Freq: Every day | ORAL | Status: DC

## 2020-01-19 MED ORDER — FLUOXETINE HCL 20 MG PO CAPS
30.0000 mg | ORAL_CAPSULE | Freq: Every day | ORAL | Status: DC
  Administered 2020-01-20 – 2020-01-24 (×5): 30 mg via ORAL
  Filled 2020-01-19 (×6): qty 1

## 2020-01-19 MED ORDER — TRAZODONE HCL 100 MG PO TABS
100.0000 mg | ORAL_TABLET | Freq: Every evening | ORAL | Status: DC | PRN
  Administered 2020-01-19 – 2020-01-24 (×6): 100 mg via ORAL
  Filled 2020-01-19: qty 7
  Filled 2020-01-19 (×7): qty 1

## 2020-01-19 MED ORDER — HYDROXYZINE HCL 25 MG PO TABS
25.0000 mg | ORAL_TABLET | Freq: Four times a day (QID) | ORAL | Status: DC | PRN
  Administered 2020-01-19: 25 mg via ORAL
  Filled 2020-01-19: qty 1

## 2020-01-19 MED ORDER — ONDANSETRON 4 MG PO TBDP: 4.0000 mg | ORAL_TABLET | Freq: Four times a day (QID) | ORAL | Status: AC | PRN
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
## 2020-01-19 NOTE — ED Notes (Signed)
Pt woke - ambulated to bathroom and back to room w/o difficulty.

## 2020-01-19 NOTE — BH Assessment (Signed)
Patient accepted to [facility] by Dr. Dwyane Dee. The attending provider is Dr. Mallie Darting. Nursing report [phone]. Room 306-1.

## 2020-01-19 NOTE — ED Provider Notes (Signed)
Emergency Medicine Observation Re-evaluation Note  Alvin Warren is a 59 y.o. male, seen on rounds today.  Pt initially presented to the ED for complaints of Chest Pain and Suicidal Currently, the patient is resting comfortably in bed.   Physical Exam  BP 119/74   Pulse 69   Temp 98.2 F (36.8 C) (Oral)   Resp 16   Ht 6\' 3"  (1.905 m)   Wt 93 kg   SpO2 99%   BMI 25.62 kg/m  Physical Exam Constitutional:      General: He is not in acute distress.    Appearance: Normal appearance. He is not ill-appearing, toxic-appearing or diaphoretic.     Comments: Patient is resting comfortably in bed  HENT:     Head: Normocephalic and atraumatic.  Cardiovascular:     Rate and Rhythm: Normal rate and regular rhythm.     Pulses: Normal pulses.  Pulmonary:     Effort: Pulmonary effort is normal.     Breath sounds: Normal breath sounds.  Skin:    General: Skin is warm and dry.  Neurological:     Mental Status: He is alert and oriented to person, place, and time.     Comments: Patient is spontaneously moving all 4 extremities  Psychiatric:        Mood and Affect: Mood normal.        Behavior: Behavior normal.        Thought Content: Thought content normal.     ED Course / MDM  EKG:EKG Interpretation  Date/Time:  Monday January 17 2020 07:30:24 EDT Ventricular Rate:  96 PR Interval:    QRS Duration: 84 QT Interval:  362 QTC Calculation: 458 R Axis:   66 Text Interpretation: Sinus rhythm Probable left atrial enlargement ST elevation,  similar when compared to prior Confirmed by Quintella Reichert [phone]) on 01/17/2020 7:41:34 AM    I have reviewed the labs performed to date as well as medications administered while in observation.  Plan  Current plan is for patient awaiting psych disposition.  Patient is not under IVC, states that he wants to be here at this time.  Yesterday patient was complaining of eye pain, I did CT patient and he did have left orbital floor fracture with inferior  proptosis of fat and nondisplaced fracture of the anterior left maxillary wall, patient is scheduled to see ophthalmology and ENT outpatient after psychiatric clearance.  See note from yesterday..  Both ENT and optho recommend outpatient follow up. Will put this in dispo.  Patient is not under full IVC at this time.      Alfredia Client, PA-C 01/19/20 1222    Gareth Morgan, MD 01/20/20 2112

## 2020-01-19 NOTE — ED Notes (Addendum)
Pt eating dinner. Pt aware and voiced agreement w/tx plan - Accepted to [facility] 307-1 - Pt signed consent form - Copy faxed to [facility] - Copy sent to Medical Records - Original placed in folder for [facility]. ALL belongings - 1 labeled belongings bag and 1 valuables envelope - Safe Transport - Pt aware - Pt has eye patch and eyeglasses.

## 2020-01-19 NOTE — ED Notes (Signed)
Pt's lunch arrived 

## 2020-01-19 NOTE — Tx Team (Signed)
Initial Treatment Plan 01/19/2020 10:22 PM Alvin Warren ZOX:[id number]    PATIENT STRESSORS: Financial difficulties Marital or family conflict Substance abuse   PATIENT STRENGTHS: Ability for insight Average or above average intelligence Capable of independent living Communication skills Motivation for treatment/growth   PATIENT IDENTIFIED PROBLEMS:   ' I've been depressed"    " thought about shooting myself with my friends gun or jumping into traffic"   " I drink alcohol regularly"               DISCHARGE CRITERIA:  Ability to meet basic life and health needs Adequate post-discharge living arrangements Reduction of life-threatening or endangering symptoms to within safe limits  PRELIMINARY DISCHARGE PLAN: Attend 12-step recovery group Outpatient therapy  PATIENT/FAMILY INVOLVEMENT: This treatment plan has been presented to and reviewed with the patient, Alvin Warren, and/or family member, .  The patient and family have been given the opportunity to ask questions and make suggestions.  Franciso Bend, RN 01/19/2020, 10:22 PM
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
## 2020-01-19 NOTE — ED Notes (Signed)
Lunch ordered 

## 2020-01-19 NOTE — ED Notes (Signed)
Telepsych being performed. 

## 2020-01-19 NOTE — Progress Notes (Signed)
Psychoeducational Group Note  Date:  01/19/2020 Time:  2144  Group Topic/Focus:  Wrap-Up Group:   The focus of this group is to help patients review their daily goal of treatment and discuss progress on daily workbooks.  Participation Level: Did Not Attend  Participation Quality:  Not Applicable  Affect:  Not Applicable  Cognitive:  Not Applicable  Insight:  Not Applicable  Engagement in Group: Not Applicable  Additional Comments:  The patient was admitted to the hallway after the group concluded.   Archie Balboa S 01/19/2020, 9:44 PM

## 2020-01-20 LAB — HEPATIC FUNCTION PANEL
ALT: 18 U/L (ref 0–44)
AST: 21 U/L (ref 15–41)
Albumin: 3.7 g/dL (ref 3.5–5.0)
Alkaline Phosphatase: 57 U/L (ref 38–126)
Bilirubin, Direct: <0.1 mg/dL (ref 0.0–0.2)
Total Bilirubin: 0.5 mg/dL (ref 0.3–1.2)
Total Protein: 8 g/dL (ref 6.5–8.1)

## 2020-01-20 LAB — T4, FREE: Free T4: 0.9 ng/dL (ref 0.61–1.12)

## 2020-01-20 LAB — HEMOGLOBIN A1C
Hgb A1c MFr Bld: 6 % — ABNORMAL HIGH (ref 4.8–5.6)
Mean Plasma Glucose: 125.5 mg/dL

## 2020-01-20 LAB — LIPID PANEL
Cholesterol: 184 mg/dL (ref 0–200)
HDL: 64 mg/dL (ref >40)
LDL Cholesterol: 100 mg/dL — ABNORMAL HIGH (ref 0–99)
Total CHOL/HDL Ratio: 2.9 RATIO
Triglycerides: 98 mg/dL (ref <150)
VLDL: 20 mg/dL (ref 0–40)

## 2020-01-20 LAB — TSH: TSH: 0.604 u[IU]/mL (ref 0.350–4.500)

## 2020-01-20 MED ORDER — FOLIC ACID 1 MG PO TABS
1.0000 mg | ORAL_TABLET | Freq: Every day | ORAL | Status: DC
  Administered 2020-01-20 – 2020-01-25 (×6): 1 mg via ORAL
  Filled 2020-01-20: qty 1
  Filled 2020-01-20: qty 7
  Filled 2020-01-20 (×6): qty 1

## 2020-01-20 NOTE — Progress Notes (Signed)
Pt awake in dayroom on initial contact. Denies SI, HI and AVH when assessed. Report pain in bilateral legs 8/10. Rates his depression 8/10, hopelessness 8/10 and anxiety 10/10. Visible in [place]ie at long intervals during shift. Compliant with medications, cooperative with unit routines. Reports poor sleep "I was up most of the night. I rather keep the anxiety medicines and take it tonight so I can sleep well.   Emotional support offered to pt. Q 15 minutes safety checks maintained without self harm gestures. All medications administered with verbal education and effects monitor. Pt remains safe on and off unit.

## 2020-01-20 NOTE — Progress Notes (Signed)
Psychoeducational Group Note  Date:  01/20/2020 Time:  2117  Group Topic/Focus:  Wrap-Up Group:   The focus of this group is to help patients review their daily goal of treatment and discuss progress on daily workbooks.  Participation Level: Did Not Attend  Participation Quality:  Not Applicable  Affect:  Not Applicable  Cognitive:  Not Applicable  Insight:  Not Applicable  Engagement in Group: Not Applicable  Additional Comments:  The patient did not attend group this evening.   Archie Balboa S 01/20/2020, 9:17 PM

## 2020-01-20 NOTE — Progress Notes (Signed)
   01/20/20 2200  Psych Admission Type (Psych Patients Only)  Admission Status Voluntary  Psychosocial Assessment  Patient Complaints Depression;Worrying  Eye Contact Fair  Facial Expression Flat  Affect Appropriate to circumstance  Speech Logical/coherent  Interaction Assertive  Motor Activity Slow  Appearance/Hygiene Unremarkable  Behavior Characteristics Cooperative;Appropriate to situation  Mood Depressed;Pleasant  Thought Process  Coherency WDL  Content WDL  Delusions None reported or observed  Perception WDL  Hallucination None reported or observed  Judgment Impaired  Confusion None  Danger to Self  Current suicidal ideation? Denies  Self-Injurious Behavior No self-injurious ideation or behavior indicators observed or expressed   Agreement Not to Harm Self Yes  Description of Agreement verbally contracts for safety  Danger to Others  Danger to Others None reported or observed  Pt. States that he was feeling "a little rough" earlier in the day but was feeling better this evening.  Pt. States that a lot of his day has been occupied "thinking about situations".  Pt. States that he is upset that he was able to function normally, such as working, having a place to live, checking in on his loved ones and is now in this situation.  Pt. Offered support and encouragement, he denies any other needs at this time.
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
## 2020-01-21 MED ORDER — HYDROCORTISONE 0.5 % EX CREA
TOPICAL_CREAM | Freq: Two times a day (BID) | CUTANEOUS | Status: DC
  Administered 2020-01-21 – 2020-01-24 (×5): 1 via TOPICAL
  Filled 2020-01-21: qty 28.35

## 2020-01-21 MED ORDER — BACITRACIN-NEOMYCIN-POLYMYXIN 400-5-5000 EX OINT: TOPICAL_OINTMENT | Freq: Two times a day (BID) | CUTANEOUS | Status: DC

## 2020-01-21 MED ORDER — BACITRACIN-NEOMYCIN-POLYMYXIN OINTMENT TUBE
TOPICAL_OINTMENT | Freq: Two times a day (BID) | CUTANEOUS | Status: DC
  Administered 2020-01-22 – 2020-01-24 (×4): 1 via TOPICAL
  Filled 2020-01-21: qty 14.17

## 2020-01-21 NOTE — Progress Notes (Signed)
Adult Psychoeducational Group Note  Date:  01/21/2020 Time:  10:37 PM  Group Topic/Focus:  Wrap-Up Group:   The focus of this group is to help patients review their daily goal of treatment and discuss progress on daily workbooks.  Participation Level:  Active  Participation Quality:  Attentive  Affect:  Appropriate  Cognitive:  Alert  Insight: Good  Engagement in Group:  Developing/Improving  Modes of Intervention:  Education and Support  Additional Comments:  Patient fully participated in the group.Nicole Kindred Shared with the group that he's unwilling to stop drinking." I have many sponsors, I attended many [place] meetings, but nothing has changed. What I want to do is to drink and drink".  Dylan Monforte  Patricia Pesa 01/21/2020, 10:37 PM

## 2020-01-21 NOTE — Progress Notes (Signed)
Pt A & O X4 presents with flat affect and depressed mood. Pt rates his anxiety 9/10 and depression 7/10.  Per pt "I can't get pass the negative thoughts and it is messing with my mood and my sleep". Reports being hopeful about working with CSW to find him placement. Denies SI, HI and AVH when assessed. Rates his pain 7/10 in bilateral legs. Emotional support offered to pt as needed throughout this shift. Q 15 minutes safety checks maintained without self harm gestures. All medications administered with verbal education and effects monitor. Writer encouraged pt to voice concerns.  Pt remains safe on and off unit. Tolerates all PO intake when offered. Reports relief with PRN Tylenol and Vistaril when reassessed. Remains safe on and off unit.

## 2020-01-21 NOTE — Progress Notes (Signed)
Recreation Therapy Notes  Date:  7.2.21 Time: 0930 Location: [address] Group Room  Group Topic: Stress Management  Goal Area(s) Addresses:  Patient will identify positive stress management techniques. Patient will identify benefits of using stress management post d/c.  Intervention: Stress Management  Activity: Meditation.  LRT played a meditation that focused on taking on the characteristics of a mountain into your meditation and everyday life.  Patients were to listen and follow along as meditation played to fully engage in activity.    Education:  Stress Management, Discharge Planning.   Education Outcome: Acknowledges Education  Clinical Observations/Feedback: Pt did not attend group session.    Victorino Sparrow, LRT/CTRS         Ria Comment, Camillia Marcy A 01/21/2020 10:07 AM
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
## 2020-01-21 NOTE — Tx Team (Signed)
Interdisciplinary Treatment and Diagnostic Plan Update  01/21/2020 Time of Session: 9:30am Alvin Warren MRN: [id number]  Principal Diagnosis: <principal problem not specified>  Secondary Diagnoses: Active Problems:   MDD (major depressive disorder), recurrent episode, severe (HCC)   Current Medications:  Current Facility-Administered Medications  Medication Dose Route Frequency Provider Last Rate Last Admin  . acetaminophen (TYLENOL) tablet 650 mg  650 mg Oral Q6H PRN Connye Burkitt, NP   650 mg at 01/21/20 0818  . alum & mag hydroxide-simeth (MAALOX/MYLANTA) 200-200-20 MG/5ML suspension 30 mL  30 mL Oral Q4H PRN Connye Burkitt, NP      . FLUoxetine (PROZAC) capsule 30 mg  30 mg Oral Daily Connye Burkitt, NP   30 mg at 01/21/20 0816  . folic acid (FOLVITE) tablet 1 mg  1 mg Oral Daily Sharma Covert, MD   1 mg at 01/21/20 0817  . gabapentin (NEURONTIN) capsule 300 mg  300 mg Oral TID Connye Burkitt, NP   300 mg at 01/21/20 0816  . hydrochlorothiazide (HYDRODIURIL) tablet 25 mg  25 mg Oral Daily Connye Burkitt, NP   25 mg at 01/21/20 0816  . hydrOXYzine (ATARAX/VISTARIL) tablet 50 mg  50 mg Oral TID PRN Connye Burkitt, NP   50 mg at 01/21/20 0818  . loperamide (IMODIUM) capsule 2-4 mg  2-4 mg Oral PRN Connye Burkitt, NP      . LORazepam (ATIVAN) tablet 1 mg  1 mg Oral Q6H PRN Connye Burkitt, NP      . losartan (COZAAR) tablet 25 mg  25 mg Oral Daily Connye Burkitt, NP   25 mg at 01/21/20 0817  . magnesium hydroxide (MILK OF MAGNESIA) suspension 30 mL  30 mL Oral Daily PRN Connye Burkitt, NP      . multivitamin with minerals tablet 1 tablet  1 tablet Oral Daily Connye Burkitt, NP   1 tablet at 01/21/20 0816  . ondansetron (ZOFRAN-ODT) disintegrating tablet 4 mg  4 mg Oral Q6H PRN Connye Burkitt, NP      . thiamine tablet 100 mg  100 mg Oral Daily Connye Burkitt, NP   100 mg at 01/21/20 0816  . traZODone (DESYREL) tablet 100 mg  100 mg Oral QHS PRN Connye Burkitt, NP   100 mg at 01/20/20  [id number]   PTA Medications: Medications Prior to Admission  Medication Sig Dispense Refill Last Dose  . Aspirin-Acetaminophen-Caffeine (GOODYS EXTRA STRENGTH) 500-325-65 MG PACK Take 1 Package by mouth daily as needed (pain).     . famotidine (PEPCID) 20 MG tablet Take 1 tablet (20 mg total) by mouth 2 (two) times daily. (Patient not taking: Reported on 01/17/2020) 10 tablet 0   . FLUoxetine (PROZAC) 20 MG capsule Take 20 mg by mouth daily.     Marland Kitchen gabapentin (NEURONTIN) 300 MG capsule Take 1 capsule (300 mg total) by mouth 3 (three) times daily. 90 capsule 0   . hydrochlorothiazide (HYDRODIURIL) 25 MG tablet Take 1 tablet (25 mg total) by mouth daily. 30 tablet 0   . hydrOXYzine (VISTARIL) 50 MG capsule Take 50 mg by mouth at bedtime as needed for sleep.     Marland Kitchen losartan (COZAAR) 25 MG tablet Take 25 mg by mouth daily.     . predniSONE (DELTASONE) 10 MG tablet Take 6 tabs/60 mg for first 2 days, 5 tabs the next 2 days, 4 tabs for 2 days, 3 tabs for 2 days, 2 tabs for 2  days, 1 tab for 2 days (Patient not taking: Reported on 01/17/2020) 42 tablet 0   . propranolol (INDERAL) 40 MG tablet Take 1 tablet (40 mg total) by mouth 2 (two) times daily. 60 tablet 2   . tiZANidine (ZANAFLEX) 4 MG tablet Take 1 tablet (4 mg total) by mouth every 8 (eight) hours as needed for muscle spasms. 30 tablet 0   . topiramate (TOPAMAX) 50 MG tablet Take 1 tablet (50 mg total) by mouth 3 (three) times daily. (Patient not taking: Reported on 01/17/2020) 90 tablet 1   . traZODone (DESYREL) 100 MG tablet Take 1 tablet (100 mg total) by mouth at bedtime as needed for sleep. 90 tablet 1     Patient Stressors: Financial difficulties Marital or family conflict Substance abuse  Patient Strengths: Ability for insight Average or above average intelligence Capable of independent living Agricultural engineer for treatment/growth  Treatment Modalities: Medication Management, Group therapy, Case management,  1 to 1 session  with clinician, Psychoeducation, Recreational therapy.   Physician Treatment Plan for Primary Diagnosis: <principal problem not specified> Long Term Goal(s):     Short Term Goals:    Medication Management: Evaluate patient's response, side effects, and tolerance of medication regimen.  Therapeutic Interventions: 1 to 1 sessions, Unit Group sessions and Medication administration.  Evaluation of Outcomes: Not Met  Physician Treatment Plan for Secondary Diagnosis: Active Problems:   MDD (major depressive disorder), recurrent episode, severe ([place])  Long Term Goal(s):     Short Term Goals:       Medication Management: Evaluate patient's response, side effects, and tolerance of medication regimen.  Therapeutic Interventions: 1 to 1 sessions, Unit Group sessions and Medication administration.  Evaluation of Outcomes: Not Met   RN Treatment Plan for Primary Diagnosis: <principal problem not specified> Long Term Goal(s): Knowledge of disease and therapeutic regimen to maintain health will improve  Short Term Goals: Ability to demonstrate self-control, Ability to participate in decision making will improve, Ability to identify and develop effective coping behaviors will improve and Compliance with prescribed medications will improve  Medication Management: RN will administer medications as ordered by provider, will assess and evaluate patient's response and provide education to patient for prescribed medication. RN will report any adverse and/or side effects to prescribing provider.  Therapeutic Interventions: 1 on 1 counseling sessions, Psychoeducation, Medication administration, Evaluate responses to treatment, Monitor vital signs and CBGs as ordered, Perform/monitor CIWA, COWS, AIMS and Fall Risk screenings as ordered, Perform wound care treatments as ordered.  Evaluation of Outcomes: Not Met   LCSW Treatment Plan for Primary Diagnosis: <principal problem not specified> Long Term  Goal(s): Safe transition to appropriate next level of care at discharge, Engage patient in therapeutic group addressing interpersonal concerns.  Short Term Goals: Engage patient in aftercare planning with referrals and resources  Therapeutic Interventions: Assess for all discharge needs, 1 to 1 time with Social worker, Explore available resources and support systems, Assess for adequacy in community support network, Educate family and significant other(s) on suicide prevention, Complete Psychosocial Assessment, Interpersonal group therapy.  Evaluation of Outcomes: Not Met   Progress in Treatment: Attending groups: No. Participating in groups: No. Taking medication as prescribed: Yes. Toleration medication: Yes. Family/Significant other contact made: No, will contact:  if patient consents to collateral contacts Patient understands diagnosis: Yes. Discussing patient identified problems/goals with staff: Yes. Medical problems stabilized or resolved: Yes. Denies suicidal/homicidal ideation: Yes. Issues/concerns per patient self-inventory: No. Other:   New problem(s) identified: None  New Short Term/Long Term Goal(s): Detox, medication stabilization, elimination of SI thoughts, development of comprehensive mental wellness plan.    Patient Goals:  "To get into some form of after care treatment"  Discharge Plan or Barriers: Patient recently admitted. Patient expressed interest in residential treatment facilities.  CSW will continue to follow and assess for appropriate referrals and possible discharge planning.    Reason for Continuation of Hospitalization: Anxiety Depression Medication stabilization Suicidal ideation Other; describe Substance abuse  Estimated Length of Stay: 3-5 days   Attendees: Patient: Alvin Warren  01/21/2020 10:53 AM  Physician: Dr. Myles Lipps, MD 01/21/2020 10:53 AM  Nursing:  01/21/2020 10:53 AM  RN Care Manager: 01/21/2020 10:53 AM  Social Worker: Radonna Ricker, LCSW 01/21/2020 10:53 AM  Recreational Therapist:  01/21/2020 10:53 AM  Other:  01/21/2020 10:53 AM  Other:  01/21/2020 10:53 AM  Other: 01/21/2020 10:53 AM    Scribe for Treatment Team: Marylee Floras, [place] 01/21/2020 10:53 AM
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
## 2020-01-22 LAB — T3, FREE: T3, Free: 2.9 pg/mL (ref 2.0–4.4)

## 2020-01-22 NOTE — Progress Notes (Signed)
   01/21/20 2140  Psych Admission Type (Psych Patients Only)  Admission Status Voluntary  Psychosocial Assessment  Patient Complaints Anxiety;Depression;Insomnia;Sadness;Worrying  Eye Contact Fair  Facial Expression Sad;Flat  Affect Appropriate to circumstance;Anxious;Depressed  Speech Logical/coherent  Interaction Assertive  Motor Activity Slow (walks with care)  Appearance/Hygiene Unremarkable  Behavior Characteristics Cooperative;Calm;Anxious  Mood Depressed;Anxious;Pleasant;Sad  Thought Process  Coherency WDL  Content WDL  Delusions None reported or observed  Perception WDL  Hallucination None reported or observed  Judgment Impaired  Confusion None  Danger to Self  Current suicidal ideation? Denies  Self-Injurious Behavior No self-injurious ideation or behavior indicators observed or expressed   Danger to Others  Danger to Others None reported or observed
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
## 2020-01-22 NOTE — Progress Notes (Signed)
   01/21/20 2140  COVID-19 Daily Checkoff  Have you had a fever (temp > 37.80C/100F)  in the past 24 hours?  No  COVID-19 EXPOSURE  Have you traveled outside the state in the past 14 days? No  Have you been in contact with someone with a confirmed diagnosis of COVID-19 or PUI in the past 14 days without wearing appropriate PPE? No  Have you been living in the same home as a person with confirmed diagnosis of COVID-19 or a PUI (household contact)? No  Have you been diagnosed with COVID-19? No

## 2020-01-22 NOTE — Progress Notes (Signed)
[place] NOVEL CORONAVIRUS (COVID-19) DAILY CHECK-OFF SYMPTOMS - answer yes or no to each - every day NO YES  Have you had a fever in the past 24 hours?  . Fever (Temp > 37.80C / 100F) X   Have you had any of these symptoms in the past 24 hours? . New Cough .  Sore Throat  .  Shortness of Breath .  Difficulty Breathing .  Unexplained Body Aches   X   Have you had any one of these symptoms in the past 24 hours not related to allergies?   . Runny Nose .  Nasal Congestion .  Sneezing   X   If you have had runny nose, nasal congestion, sneezing in the past 24 hours, has it worsened?  X   EXPOSURES - check yes or no X   Have you traveled outside the state in the past 14 days?  X   Have you been in contact with someone with a confirmed diagnosis of COVID-19 or PUI in the past 14 days without wearing appropriate PPE?  X   Have you been living in the same home as a person with confirmed diagnosis of COVID-19 or a PUI (household contact)?    X   Have you been diagnosed with COVID-19?    X              What to do next: Answered NO to all: Answered YES to anything:   Proceed with unit schedule Follow the [facility] Inpatient Flowsheet.   

## 2020-01-22 NOTE — Progress Notes (Signed)
DAR NOTE: Patient presents with calm  affect and pleasant mood.  Denies pain, auditory and visual hallucinations.  Maintained on routine safety checks.  Medications given as prescribed.  Support and encouragement offered as needed.  Attended group and participated.  Will continue to monitor. 

## 2020-01-22 NOTE — [facility] Group Notes (Signed)
Adult Psychoeducational Group Note  Date:  01/22/2020 Time:  9:55 PM  Group Topic/Focus:  Wrap-Up Group:   The focus of this group is to help patients review their daily goal of treatment and discuss progress on daily workbooks.  Participation Level:  Active  Participation Quality:  Appropriate and Attentive  Affect:  Depressed and Flat  Cognitive:  Alert and Appropriate  Insight: Appropriate and Good  Engagement in Group:  Engaged  Modes of Intervention:  Discussion and Education  Additional Comments:  Pt attended and participated in wrap up group this evening. Pt stated that they "stayed in their head" a lot today, and thought a lot about their past with addiction. Pt is disappointed about not receiving resources from SW. But states they feel unimportant and as if they do not matter. Although pt has these feeling, they stated that they do not want to die so they see that as a positive. Pt goal is to work on their after plan and to not isolate themselves.    Cristi Loron 01/22/2020, 9:55 PM

## 2020-01-22 NOTE — Progress Notes (Signed)
D. Pt presents as depressed and anxious- per pt's self inventory, pt rated his depression, hopelessness and anxiety 6/6/8, respectively. Pt endorses some withdrawal symptoms: cravings, irritability and anxiety. Pt reports that his goal today is to "talk to someone about placement". Pt states that "If I don't find placement I feel it will set me back". Pt currently denies SI/HI and AVH  A. Labs and vitals monitored. Pt given and educated on medications. Pt supported emotionally and encouraged to express concerns and ask questions.   R. Pt remains safe with 15 minute checks. Will continue POC.

## 2020-01-23 DIAGNOSIS — F332 Major depressive disorder, recurrent severe without psychotic features: Principal | ICD-10-CM
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
## 2020-01-23 NOTE — Plan of Care (Signed)
Nurse discussed anxiety, depression and coping skills with patient.  
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
## 2020-01-24 MED ORDER — PAROXETINE HCL 10 MG PO TABS
10.0000 mg | ORAL_TABLET | Freq: Every day | ORAL | Status: DC
  Administered 2020-01-24 – 2020-01-25 (×2): 10 mg via ORAL
  Filled 2020-01-24: qty 1
  Filled 2020-01-24: qty 7
  Filled 2020-01-24 (×3): qty 1

## 2020-01-24 NOTE — Progress Notes (Signed)
Recreation Therapy Notes  Date:  7.5.21 Time: 0930 Location: [address] Dayroom  Group Topic: Stress Management  Goal Area(s) Addresses:  Patient will identify positive stress management techniques. Patient will identify benefits of using stress management post d/c.  Intervention: Stress Management  Activity: Meditation.  LRT played a meditation that focused on being resilient in the face of change.  Patients were to listen and follow along as meditation played in order to engage in activity.    Education:  Stress Management, Discharge Planning.   Education Outcome: Acknowledges Education  Clinical Observations/Feedback: Pt did not attend group activity.    Victorino Sparrow, LRT/CTRS         Ria Comment, Asier Desroches A 01/24/2020 11:07 AM
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
## 2020-01-24 NOTE — Progress Notes (Signed)
   01/23/20 2015  COVID-19 Daily Checkoff  Have you had a fever (temp > 37.80C/100F)  in the past 24 hours?  No  COVID-19 EXPOSURE  Have you traveled outside the state in the past 14 days? No  Have you been in contact with someone with a confirmed diagnosis of COVID-19 or PUI in the past 14 days without wearing appropriate PPE? No  Have you been living in the same home as a person with confirmed diagnosis of COVID-19 or a PUI (household contact)? No  Have you been diagnosed with COVID-19? No

## 2020-01-24 NOTE — Progress Notes (Addendum)
   01/24/20 1120  Psych Admission Type (Psych Patients Only)  Admission Status Voluntary  Psychosocial Assessment  Patient Complaints Anxiety;Depression  Eye Contact Fair  Facial Expression Worried;Anxious;Flat  Affect Anxious;Sad;Depressed  Speech Logical/coherent  Interaction Cautious;Minimal  Motor Activity Other (Comment);Slow (walks with care)  Appearance/Hygiene Unremarkable  Behavior Characteristics Cooperative  Mood Anxious;Depressed  Thought Process  Coherency WDL  Content WDL  Delusions None reported or observed  Perception WDL  Hallucination None reported or observed  Judgment Impaired  Confusion None  Danger to Self  Current suicidal ideation? Denies  Self-Injurious Behavior No self-injurious ideation or behavior indicators observed or expressed   Agreement Not to Harm Self Yes  Description of Agreement verbally contracts for safety  Danger to Others  Danger to Others None reported or observed   Pt A & O X4. Denies SI, HI, AVH and pain when assessed.Verbally contracts for safety.  Endorsed racing thoughts "but not SI". Reports placement continues to be a major stressor for him and will like to follow up with assigned CSW. Slept fair, with fair appetite, low mood and good concentration level. Rates his depression and hopelessness both 5/10 and anxiety 8/10. Affect is congruent. Pt observed to be more interactive with peers and staff.  Emotional support and encouragement offered. Treatment team made aware of pt's concern. Scheduled and PRN medications given as ordered and effects monitored. Safety checks maintained without issues. Pt tolerates all PO intake well. Denies concerns at this time.
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
## 2020-01-24 NOTE — Progress Notes (Signed)
Pt did not attend the evening wrap up group. Pt was notified that group was beginning but remained in room.

## 2020-01-24 NOTE — Progress Notes (Signed)
   01/23/20 2015  Psych Admission Type (Psych Patients Only)  Admission Status Voluntary  Psychosocial Assessment  Patient Complaints Anxiety;Depression;Isolation;Hopelessness;Sadness  Eye Contact Fair  Facial Expression Sad;Flat;Worried  Affect Anxious;Sad;Depressed  Speech Logical/coherent  Interaction Minimal;Isolative  Motor Activity Other (Comment);Slow (walks with care)  Appearance/Hygiene Unremarkable  Behavior Characteristics Anxious;Cooperative  Mood Depressed;Anxious;Sad  Thought Process  Coherency WDL  Content WDL  Delusions None reported or observed  Perception WDL  Hallucination None reported or observed  Judgment Impaired  Confusion None  Danger to Self  Current suicidal ideation? Denies  Self-Injurious Behavior No self-injurious ideation or behavior indicators observed or expressed   Agreement Not to Harm Self Yes  Description of Agreement verbally contracts for safety  Danger to Others  Danger to Others None reported or observed

## 2020-01-24 NOTE — Progress Notes (Signed)
   01/24/20 2300  Psych Admission Type (Psych Patients Only)  Admission Status Voluntary  Psychosocial Assessment  Patient Complaints Anxiety  Eye Contact Fair  Facial Expression Worried;Anxious;Flat  Affect Anxious;Sad;Depressed  Speech Logical/coherent  Interaction Cautious;Minimal  Motor Activity Other (Comment);Slow (walks with care)  Appearance/Hygiene Unremarkable  Behavior Characteristics Cooperative  Mood Anxious  Thought Process  Coherency WDL  Content WDL  Delusions None reported or observed  Perception WDL  Hallucination None reported or observed  Judgment Impaired  Confusion None  Danger to Self  Current suicidal ideation? Denies  Self-Injurious Behavior No self-injurious ideation or behavior indicators observed or expressed   Agreement Not to Harm Self Yes  Description of Agreement verbally contracts for safety  Danger to Others  Danger to Others None reported or observed

## 2020-01-25 MED ORDER — GABAPENTIN 300 MG PO CAPS: 300.0000 mg | ORAL_CAPSULE | Freq: Three times a day (TID) | ORAL | 0 refills | Status: DC

## 2020-01-25 MED ORDER — TRAZODONE HCL 100 MG PO TABS: 100.0000 mg | ORAL_TABLET | Freq: Every evening | ORAL | 0 refills | Status: DC | PRN

## 2020-01-25 MED ORDER — HYDROCHLOROTHIAZIDE 25 MG PO TABS: 25.0000 mg | ORAL_TABLET | Freq: Every day | ORAL | Status: DC

## 2020-01-25 MED ORDER — BACITRACIN-NEOMYCIN-POLYMYXIN OINTMENT TUBE: 1.0000 "application " | TOPICAL_OINTMENT | Freq: Two times a day (BID) | CUTANEOUS | 0 refills | Status: DC

## 2020-01-25 MED ORDER — HYDROCHLOROTHIAZIDE 25 MG PO TABS: 25.0000 mg | ORAL_TABLET | Freq: Every day | ORAL | 0 refills | Status: DC

## 2020-01-25 MED ORDER — HYDROCORTISONE 0.5 % EX CREA: TOPICAL_CREAM | Freq: Two times a day (BID) | CUTANEOUS | 0 refills | Status: DC

## 2020-01-25 MED ORDER — PAROXETINE HCL 10 MG PO TABS: 10.0000 mg | ORAL_TABLET | Freq: Every day | ORAL | 0 refills | Status: DC

## 2020-01-25 MED ORDER — HYDROXYZINE HCL 50 MG PO TABS: 50.0000 mg | ORAL_TABLET | Freq: Three times a day (TID) | ORAL | 0 refills | Status: DC | PRN

## 2020-01-25 MED ORDER — LOSARTAN POTASSIUM 25 MG PO TABS: 25.0000 mg | ORAL_TABLET | Freq: Every day | ORAL | 0 refills | Status: DC

## 2020-01-25 NOTE — Plan of Care (Signed)
Discharge note  Patient verbalizes readiness for discharge. Follow up plan explained, AVS, Transition record and SRA given. Prescriptions and teaching provided. Belongings returned and signed for. Suicide safety plan completed and signed. Patient verbalizes understanding. Patient denies SI/HI and assures this Probation officer they will seek assistance should that change. Patient discharged to lobby to wait for safe transport. Pt going to [facility].   Problem: Education: Goal: Knowledge of [facility] Education information/materials will improve Outcome: Adequate for Discharge Goal: Emotional status will improve Outcome: Adequate for Discharge Goal: Mental status will improve 01/25/2020 1645 by Baron Sane, RN Outcome: Adequate for Discharge 01/25/2020 1000 by Baron Sane, RN Outcome: Progressing Goal: Verbalization of understanding the information provided will improve 01/25/2020 1645 by Baron Sane, RN Outcome: Adequate for Discharge 01/25/2020 1000 by Baron Sane, RN Outcome: Progressing   Problem: Activity: Goal: Interest or engagement in activities will improve Outcome: Adequate for Discharge Goal: Sleeping patterns will improve 01/25/2020 1645 by Baron Sane, RN Outcome: Adequate for Discharge 01/25/2020 1000 by Baron Sane, RN Outcome: Progressing   Problem: Coping: Goal: Ability to verbalize frustrations and anger appropriately will improve Outcome: Adequate for Discharge Goal: Ability to demonstrate self-control will improve 01/25/2020 1645 by Baron Sane, RN Outcome: Adequate for Discharge 01/25/2020 1000 by Baron Sane, RN Outcome: Progressing   Problem: Health Behavior/Discharge Planning: Goal: Identification of resources available to assist in meeting health care needs will improve Outcome: Adequate for Discharge Goal: Compliance with treatment plan for underlying cause of condition will improve Outcome:  Adequate for Discharge   Problem: Physical Regulation: Goal: Ability to maintain clinical measurements within normal limits will improve Outcome: Adequate for Discharge   Problem: Safety: Goal: Periods of time without injury will increase Outcome: Adequate for Discharge   Problem: Education: Goal: Utilization of techniques to improve thought processes will improve Outcome: Adequate for Discharge Goal: Knowledge of the prescribed therapeutic regimen will improve Outcome: Adequate for Discharge   Problem: Activity: Goal: Interest or engagement in leisure activities will improve Outcome: Adequate for Discharge Goal: Imbalance in normal sleep/wake cycle will improve Outcome: Adequate for Discharge   Problem: Coping: Goal: Coping ability will improve Outcome: Adequate for Discharge Goal: Will verbalize feelings Outcome: Adequate for Discharge   Problem: Health Behavior/Discharge Planning: Goal: Ability to make decisions will improve Outcome: Adequate for Discharge Goal: Compliance with therapeutic regimen will improve Outcome: Adequate for Discharge   Problem: Role Relationship: Goal: Will demonstrate positive changes in social behaviors and relationships Outcome: Adequate for Discharge   Problem: Safety: Goal: Ability to disclose and discuss suicidal ideas will improve Outcome: Adequate for Discharge Goal: Ability to identify and utilize support systems that promote safety will improve Outcome: Adequate for Discharge   Problem: Self-Concept: Goal: Will verbalize positive feelings about self Outcome: Adequate for Discharge Goal: Level of anxiety will decrease Outcome: Adequate for Discharge   Problem: Education: Goal: Ability to make informed decisions regarding treatment will improve Outcome: Adequate for Discharge   Problem: Coping: Goal: Coping ability will improve Outcome: Adequate for Discharge   Problem: Health Behavior/Discharge Planning: Goal:  Identification of resources available to assist in meeting health care needs will improve Outcome: Adequate for Discharge   Problem: Medication: Goal: Compliance with prescribed medication regimen will improve Outcome: Adequate for Discharge   Problem: Self-Concept: Goal: Ability to disclose and discuss suicidal ideas will improve Outcome: Adequate for Discharge Goal: Will verbalize positive feelings about self Outcome: Adequate for Discharge  Problem: Education: Goal: Knowledge of disease or condition will improve Outcome: Adequate for Discharge Goal: Understanding of discharge needs will improve Outcome: Adequate for Discharge   Problem: Health Behavior/Discharge Planning: Goal: Ability to identify changes in lifestyle to reduce recurrence of condition will improve Outcome: Adequate for Discharge Goal: Identification of resources available to assist in meeting health care needs will improve Outcome: Adequate for Discharge   Problem: Physical Regulation: Goal: Complications related to the disease process, condition or treatment will be avoided or minimized Outcome: Adequate for Discharge   Problem: Safety: Goal: Ability to remain free from injury will improve Outcome: Adequate for Discharge

## 2020-01-25 NOTE — BHH Suicide Risk Assessment (Signed)
[facility] INPATIENT:  Family/Significant Other Suicide Prevention Education  Suicide Prevention Education:  Patient Refusal for Family/Significant Other Suicide Prevention Education: The patient Alvin Warren has refused to provide written consent for family/significant other to be provided Family/Significant Other Suicide Prevention Education during admission and/or prior to discharge.  Physician notified.  [place] 01/25/2020, 12:04 PM
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
## 2020-01-25 NOTE — [facility] Suicide Risk Assessment (Signed)
[facility] Discharge Suicide Risk Assessment   Principal Problem: MDD (major depressive disorder), recurrent episode, severe ([place]) Discharge Diagnoses: Principal Problem:   MDD (major depressive disorder), recurrent episode, severe ([place])   Total Time spent with patient: 15 minutes  Musculoskeletal: Strength & Muscle Tone: within normal limits Gait & Station: normal Patient leans: N/A  Psychiatric Specialty Exam: Review of Systems  Skin: Positive for rash.  All other systems reviewed and are negative.   Blood pressure 107/75, pulse 73, temperature 98 F (36.7 C), temperature source Oral, resp. rate 16, height 5' 11.25" (1.81 m), weight 90.7 kg, SpO2 100 %.Body mass index is 27.7 kg/m.  General Appearance: Casual  Eye Contact::  Fair  Speech:  Normal Rate409  Volume:  Normal  Mood:  Euthymic  Affect:  Congruent  Thought Process:  Coherent and Descriptions of Associations: Intact  Orientation:  Full (Time, Place, and Person)  Thought Content:  Logical  Suicidal Thoughts:  No  Homicidal Thoughts:  No  Memory:  Immediate;   Fair Recent;   Fair Remote;   Fair  Judgement:  Intact  Insight:  Fair  Psychomotor Activity:  Normal  Concentration:  Fair  Recall:  [employer] of Knowledge:Fair  Language: Fair  Akathisia:  Negative  Handed:  Right  AIMS (if indicated):     Assets:  Desire for Improvement Resilience  Sleep:  Number of Hours: 6.75  Cognition: WNL  ADL's:  Intact   Mental Status Per Nursing Assessment::   On Admission:  Suicidal ideation indicated by patient  Demographic Factors:  Male, Divorced or widowed, Low socioeconomic status, Living alone and Unemployed  Loss Factors: Loss of significant relationship  Historical Factors: Impulsivity  Risk Reduction Factors:   Positive coping skills or problem solving skills  Continued Clinical Symptoms:  Depression:   Comorbid alcohol abuse/dependence Impulsivity Alcohol/Substance Abuse/Dependencies  Cognitive  Features That Contribute To Risk:  None    Suicide Risk:  Minimal: No identifiable suicidal ideation.  Patients presenting with no risk factors but with morbid ruminations; may be classified as minimal risk based on the severity of the depressive symptoms   Follow-up Information    [facility] Follow up.   Contact information: [address] [place] [address] [phone]        [facility] Follow up.   Contact information: [address] [place] [address] [phone]        [facility] Follow up.   Why: A referral has been made to this provider on your behalf. Contact information: [place] [address] [phone]               Plan Of Care/Follow-up recommendations:  Activity:  ad lib  Sharma Covert, MD 01/25/2020, 10:47 AM

## 2020-01-25 NOTE — Progress Notes (Signed)
  [facility] Adult Case Management Discharge Plan :  Will you be returning to the same living situation after discharge:  No. Will be going to [facility] At discharge, do you have transportation home?: Yes,  [facility] [place] to provide ride Do you have the ability to pay for your medications: Yes,  [facility] at [facility]  Release of information consent forms completed and in the chart;  Patient's signature needed at discharge.  Patient to Follow up at:  Follow-up Information    [facility] Follow up.   Why: Go to see Marliss Coots on a day the clinic is open, or call her at extension [phone] to find out when you should come in Contact information:  [address], [place], [place] [address] Phone: [phone] FAX: [phone]              Next level of care provider has access to [place] and Suicide Prevention discussed: Yes,  yes  Have you used any form of tobacco in the last 30 days? (Cigarettes, Smokeless Tobacco, Cigars, and/or Pipes): Yes  Has patient been referred to the Quitline?: Yes  Patient has been referred for addiction treatment: Yes  Trish Mage, LCSW 01/25/2020, 12:00 PM

## 2020-01-25 NOTE — Clinical Social Work Note (Signed)
Alvin Warren asked for the number for [facility], stating he had graduated from their program a couple of months ago and is interested in returning.  Number given, for which he was appreciative.  He called, and let me know they can take him back whenever he is stable for discharge, and they will provide transportation to their facility.  Dr alerted.
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
## 2020-02-01 ENCOUNTER — Ambulatory Visit (HOSPITAL_COMMUNITY): Payer: Self-pay | Admitting: Clinical

## 2020-04-02 ENCOUNTER — Encounter (HOSPITAL_COMMUNITY): Payer: Self-pay | Admitting: Emergency Medicine

## 2020-04-02 ENCOUNTER — Other Ambulatory Visit: Payer: Self-pay

## 2020-04-02 ENCOUNTER — Emergency Department (HOSPITAL_COMMUNITY)
Admission: EM | Admit: 2020-04-02 | Discharge: 2020-04-02 | Disposition: A | Discharge status: Home / Self Care | Payer: Self-pay | Attending: Emergency Medicine | Admitting: Emergency Medicine

## 2020-04-02 DIAGNOSIS — F1420 Cocaine dependence, uncomplicated: Secondary | ICD-10-CM | POA: Insufficient documentation

## 2020-04-02 DIAGNOSIS — F1014 Alcohol abuse with alcohol-induced mood disorder: Secondary | ICD-10-CM | POA: Diagnosis present

## 2020-04-02 DIAGNOSIS — F1020 Alcohol dependence, uncomplicated: Secondary | ICD-10-CM | POA: Insufficient documentation

## 2020-04-02 DIAGNOSIS — F10230 Alcohol dependence with withdrawal, uncomplicated: Secondary | ICD-10-CM | POA: Insufficient documentation

## 2020-04-02 DIAGNOSIS — R45851 Suicidal ideations: Secondary | ICD-10-CM | POA: Insufficient documentation

## 2020-04-02 DIAGNOSIS — I10 Essential (primary) hypertension: Secondary | ICD-10-CM | POA: Insufficient documentation

## 2020-04-02 DIAGNOSIS — F17210 Nicotine dependence, cigarettes, uncomplicated: Secondary | ICD-10-CM | POA: Insufficient documentation

## 2020-04-02 DIAGNOSIS — Z20822 Contact with and (suspected) exposure to covid-19: Secondary | ICD-10-CM | POA: Insufficient documentation

## 2020-04-02 DIAGNOSIS — F332 Major depressive disorder, recurrent severe without psychotic features: Secondary | ICD-10-CM | POA: Insufficient documentation

## 2020-04-02 DIAGNOSIS — Z79899 Other long term (current) drug therapy: Secondary | ICD-10-CM | POA: Insufficient documentation

## 2020-04-02 LAB — CBC
HCT: 41.4 % (ref 39.0–52.0)
Hemoglobin: 13.8 g/dL (ref 13.0–17.0)
MCH: 28.6 pg (ref 26.0–34.0)
MCHC: 33.3 g/dL (ref 30.0–36.0)
MCV: 85.9 fL (ref 80.0–100.0)
Platelets: 210 10*3/uL (ref 150–400)
RBC: 4.82 MIL/uL (ref 4.22–5.81)
RDW: 15.2 % (ref 11.5–15.5)
WBC: 3.9 10*3/uL — ABNORMAL LOW (ref 4.0–10.5)
nRBC: 0 % (ref 0.0–0.2)

## 2020-04-02 LAB — COMPREHENSIVE METABOLIC PANEL
ALT: 50 U/L — ABNORMAL HIGH (ref 0–44)
AST: 96 U/L — ABNORMAL HIGH (ref 15–41)
Albumin: 3.9 g/dL (ref 3.5–5.0)
Alkaline Phosphatase: 46 U/L (ref 38–126)
Anion gap: 13 (ref 5–15)
BUN: 7 mg/dL (ref 6–20)
CO2: 23 mmol/L (ref 22–32)
Calcium: 8.7 mg/dL — ABNORMAL LOW (ref 8.9–10.3)
Chloride: 106 mmol/L (ref 98–111)
Creatinine, Ser: 0.95 mg/dL (ref 0.61–1.24)
GFR calc Af Amer: >60 mL/min (ref >60)
GFR calc non Af Amer: >60 mL/min (ref >60)
Glucose, Bld: 94 mg/dL (ref 70–99)
Potassium: 3.7 mmol/L (ref 3.5–5.1)
Sodium: 142 mmol/L (ref 135–145)
Total Bilirubin: 1.3 mg/dL — ABNORMAL HIGH (ref 0.3–1.2)
Total Protein: 7.8 g/dL (ref 6.5–8.1)

## 2020-04-02 LAB — RAPID URINE DRUG SCREEN, HOSP PERFORMED
Amphetamines: NOT DETECTED
Barbiturates: NOT DETECTED
Benzodiazepines: NOT DETECTED
Cocaine: POSITIVE — AB
Opiates: NOT DETECTED
Tetrahydrocannabinol: NOT DETECTED

## 2020-04-02 LAB — SARS CORONAVIRUS 2 BY RT PCR (HOSPITAL ORDER, PERFORMED IN ~~LOC~~ HOSPITAL LAB): SARS Coronavirus 2: NEGATIVE

## 2020-04-02 LAB — ETHANOL: Alcohol, Ethyl (B): 178 mg/dL — ABNORMAL HIGH (ref <10)

## 2020-04-02 LAB — SALICYLATE LEVEL: Salicylate Lvl: <7 mg/dL — ABNORMAL LOW (ref 7.0–30.0)

## 2020-04-02 LAB — ACETAMINOPHEN LEVEL: Acetaminophen (Tylenol), Serum: 26 ug/mL (ref 10–30)

## 2020-04-02 MED ORDER — LOSARTAN POTASSIUM 25 MG PO TABS: 25.0000 mg | ORAL_TABLET | Freq: Every day | ORAL | Status: DC

## 2020-04-02 MED ORDER — LORAZEPAM 2 MG/ML IJ SOLN
0.0000 mg | Freq: Four times a day (QID) | INTRAMUSCULAR | Status: DC
  Filled 2020-04-02: qty 2

## 2020-04-02 MED ORDER — THIAMINE HCL 100 MG PO TABS: 100.0000 mg | ORAL_TABLET | Freq: Every day | ORAL | Status: DC

## 2020-04-02 MED ORDER — ONDANSETRON 4 MG PO TBDP
4.0000 mg | ORAL_TABLET | Freq: Once | ORAL | Status: AC
  Administered 2020-04-02: 4 mg via ORAL
  Filled 2020-04-02: qty 1

## 2020-04-02 MED ORDER — LORAZEPAM 2 MG/ML IJ SOLN: 2.0000 mg | Freq: Once | INTRAMUSCULAR | Status: DC

## 2020-04-02 MED ORDER — HYDROCHLOROTHIAZIDE 25 MG PO TABS
25.0000 mg | ORAL_TABLET | Freq: Every day | ORAL | Status: DC
  Administered 2020-04-02: 25 mg via ORAL
  Filled 2020-04-02: qty 1

## 2020-04-02 MED ORDER — LORAZEPAM 2 MG/ML IJ SOLN
2.0000 mg | Freq: Once | INTRAMUSCULAR | Status: AC
  Administered 2020-04-02: 2 mg via INTRAVENOUS
  Filled 2020-04-02: qty 1

## 2020-04-02 MED ORDER — LORAZEPAM 1 MG PO TABS: 0.0000 mg | ORAL_TABLET | Freq: Two times a day (BID) | ORAL | Status: DC

## 2020-04-02 MED ORDER — LORAZEPAM 1 MG PO TABS: 0.0000 mg | ORAL_TABLET | Freq: Four times a day (QID) | ORAL | Status: DC

## 2020-04-02 MED ORDER — LORAZEPAM 2 MG/ML IJ SOLN: 0.0000 mg | Freq: Two times a day (BID) | INTRAMUSCULAR | Status: DC

## 2020-04-02 MED ORDER — THIAMINE HCL 100 MG/ML IJ SOLN
100.0000 mg | Freq: Every day | INTRAMUSCULAR | Status: DC
  Administered 2020-04-02: 100 mg via INTRAVENOUS
  Filled 2020-04-02: qty 2

## 2020-04-02 MED ORDER — LORAZEPAM 2 MG/ML IJ SOLN
INTRAMUSCULAR | Status: AC
  Administered 2020-04-02: 4 mg via INTRAVENOUS
  Filled 2020-04-02: qty 1
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
## 2020-04-02 NOTE — ED Triage Notes (Signed)
Pt presents by [facility] for evaluation of alcohol withdrawal. EMS reports that last drink was 12-14 hours ago and pt drinks daily.
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
## 2020-04-02 NOTE — ED Notes (Signed)
Call received from pt girlfriend Rhoda Shepard@ [phone] requesting pt status/updates. [employer]

## 2020-04-02 NOTE — ED Provider Notes (Signed)
[facility] DEPT Provider Note   CSN: [id number] Arrival date & time: 04/02/20  0119     History Chief Complaint  Patient presents with  . Alcohol Intoxication    Alvin Warren is a 59 y.o. male.  Patient presents to the emergency department with a chief complaint of suicidal thoughts and alcohol withdrawal.  He states that his last drink was this morning.  He reports being somewhat tremulous.  He also states he is having suicidal thoughts and wants to kill himself by cutting himself with knives.  His girlfriend took the knives away.  He also reports using cocaine a few days ago.  He denies any other drug use.  Denies any recent illnesses.  Denies any HI.  The history is provided by the patient. No language interpreter was used.       Past Medical History:  Diagnosis Date  . Alcohol abuse   . Alcohol withdrawal seizure ([place])   . Drug abuse and dependence ([place])   . Hypertension     Patient Active Problem List   Diagnosis Date Noted  . MDD (major depressive disorder) 02/04/2019  . Essential hypertension 12/07/2018  . Tobacco abuse 12/07/2018  . ETOH abuse 12/07/2018  . Angioedema of lips 12/07/2018  . Acute kidney injury ([place]) 12/06/2018  . MDD (major depressive disorder), recurrent episode, severe ([place]) 11/07/2018  . Cocaine abuse ([place]) 11/07/2018    Past Surgical History:  Procedure Laterality Date  . BACK SURGERY         Family History  Problem Relation Age of Onset  . Heart failure Mother     Social History   Tobacco Use  . Smoking status: Current Every Day Smoker    Packs/day: 0.50    Types: Cigarettes  . Smokeless tobacco: Never Used  Vaping Use  . Vaping Use: Never used  Substance Use Topics  . Alcohol use: Yes    Comment: pt drink 4 40s a day  . Drug use: Yes    Types: "Crack" cocaine, Cocaine    Comment: Not sure of last use    Home Medications Prior to Admission medications   Medication Sig Start Date End  Date Taking? Authorizing Provider  gabapentin (NEURONTIN) 300 MG capsule Take 1 capsule (300 mg total) by mouth 3 (three) times daily. 01/25/20   Connye Burkitt, NP  hydrochlorothiazide (HYDRODIURIL) 25 MG tablet Take 1 tablet (25 mg total) by mouth daily. 01/25/20   Connye Burkitt, NP  hydrocortisone cream 0.5 % Apply topically 2 (two) times daily. 01/25/20   Connye Burkitt, NP  hydrOXYzine (ATARAX/VISTARIL) 50 MG tablet Take 1 tablet (50 mg total) by mouth 3 (three) times daily as needed for anxiety. 01/25/20   Connye Burkitt, NP  losartan (COZAAR) 25 MG tablet Take 1 tablet (25 mg total) by mouth daily. 01/25/20   Connye Burkitt, NP  neomycin-bacitracin-polymyxin (NEOSPORIN) OINT Apply 1 application topically 2 (two) times daily. 01/25/20   Connye Burkitt, NP  PARoxetine (PAXIL) 10 MG tablet Take 1 tablet (10 mg total) by mouth daily. 01/25/20   Connye Burkitt, NP  traZODone (DESYREL) 100 MG tablet Take 1 tablet (100 mg total) by mouth at bedtime as needed for sleep. 01/25/20   Connye Burkitt, NP    Allergies    Bee pollen, Lisinopril, and Penicillins  Review of Systems   Review of Systems  All other systems reviewed and are negative.   Physical Exam Updated Vital Signs  BP (!) 166/99   Pulse 87   Temp 98.2 F (36.8 C) (Oral)   Resp 18   Ht 6\' 3"  (1.905 m)   Wt 90.7 kg   SpO2 100%   BMI 25.00 kg/m   Physical Exam Vitals and nursing note reviewed.  Constitutional:      Appearance: He is well-developed.  HENT:     Head: Normocephalic and atraumatic.  Eyes:     Conjunctiva/sclera: Conjunctivae normal.  Cardiovascular:     Rate and Rhythm: Normal rate and regular rhythm.     Heart sounds: No murmur heard.   Pulmonary:     Effort: Pulmonary effort is normal. No respiratory distress.     Breath sounds: Normal breath sounds.  Abdominal:     Palpations: Abdomen is soft.     Tenderness: There is no abdominal tenderness.  Musculoskeletal:        General: Normal range of motion.      Cervical back: Neck supple.  Skin:    General: Skin is warm and dry.  Neurological:     Mental Status: He is alert and oriented to person, place, and time.     Comments: Slightly tremulous  Psychiatric:        Mood and Affect: Mood normal.        Behavior: Behavior normal.     ED Results / Procedures / Treatments   Labs (all labs ordered are listed, but only abnormal results are displayed) Labs Reviewed  SARS CORONAVIRUS 2 BY RT PCR (HOSPITAL ORDER, [facility])  COMPREHENSIVE METABOLIC PANEL  ETHANOL  SALICYLATE LEVEL  ACETAMINOPHEN LEVEL  CBC  RAPID URINE DRUG SCREEN, HOSP PERFORMED    EKG None  Radiology No results found.  Procedures Procedures (including critical care time)  Medications Ordered in ED Medications  LORazepam (ATIVAN) injection 0-4 mg (has no administration in time range)    Or  LORazepam (ATIVAN) tablet 0-4 mg (has no administration in time range)  LORazepam (ATIVAN) injection 0-4 mg (has no administration in time range)    Or  LORazepam (ATIVAN) tablet 0-4 mg (has no administration in time range)  thiamine tablet 100 mg (has no administration in time range)    Or  thiamine (B-1) injection 100 mg (has no administration in time range)    ED Course  I have reviewed the triage vital signs and the nursing notes.  Pertinent labs & imaging results that were available during my care of the patient were reviewed by me and considered in my medical decision making (see chart for details).    MDM Rules/Calculators/A&P                          Patient here with SI.  States that he wants to kill himself by cutting himself with knives.  He also reports he feels like he is withdrawing from alcohol.  Reports the last drink was about 12 hours ago.  Will give some Ativan and placed on CIWA.  Will consult TTS for evaluation, given his suicidal thoughts.  5 AM, patient CIWA score is 5.  Will recheck in 1 hour.  Patient is still  comfortable.  He is sleeping.  He does not have any significant withdrawal symptoms presently, feel that he is medically clear for [facility]. Final Clinical Impression(s) / ED Diagnoses Final diagnoses:  Suicidal ideation  Alcohol dependence with uncomplicated withdrawal ([place])    Rx / DC Orders  ED Discharge Orders    None       Montine Circle, Hershal Coria 55/73/22 0254    Delora Fuel, MD 27/06/23 [phone]
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
## 2020-04-02 NOTE — ED Provider Notes (Signed)
Emergency Medicine Observation Re-evaluation Note  Alvin Warren is a 59 y.o. male, seen on rounds today.  Pt initially presented to the ED for complaints of Alcohol Intoxication Endorses SI. Currently, the patient is sleeping soundly.   Physical Exam  BP (!) 188/94   Pulse 80   Temp 98.2 F (36.8 C) (Oral)   Resp 13   Ht 6\' 3"  (1.905 m)   Wt 90.7 kg   SpO2 98%   BMI 25.00 kg/m  Physical Exam General: Resting comfortably Cardiac: normal rate Lungs: no distress Psych: sleeping soundly  ED Course / MDM  EKG:    I have reviewed the labs performed to date as well as medications administered while in observation.  Recent changes in the last 24 hours include Ativan for elevated CIWA.  Plan  Current plan is for psych admission when CIWA is improved. . Patient is not under full IVC at this time.   Truddie Hidden, MD 04/02/20 [phone]

## 2020-04-02 NOTE — ED Notes (Signed)
LUNCH TRAY GIVEN. 

## 2020-04-02 NOTE — ED Notes (Signed)
Pt to room 29. Pt calm, cooperative, no s.s of distress. Pt drowsy and unsteady. Pt resting comfortably

## 2020-04-02 NOTE — Discharge Instructions (Signed)
[facility] in [place], [place] Address: [address], [place], [place] [address] Hours:  Closed ? Opens 6AM Mon Phone: [phone]

## 2020-04-02 NOTE — ED Notes (Signed)
Pt DCd off unit per provider. Pt alert, calm, cooperative, no s/s of distress. DC information and resources given to and reviewed with pt, pt acknowledged understanding. Belongings given to pt. Pt ambulatory off unit, escorted by nurse. Pt given bus pass for transportation.

## 2020-04-02 NOTE — ED Notes (Signed)
Pt alert and oriented. Pt has denied SI/HI. Pt calm. Cooperative. No s/s of distress.

## 2020-04-02 NOTE — ED Notes (Signed)
At time of triage pt reporting to have suicidal ideation with plan of cutting self with knives. Pt reports that friends took knives away.

## 2020-04-07 ENCOUNTER — Encounter (HOSPITAL_COMMUNITY): Payer: Self-pay | Admitting: Pediatrics

## 2020-04-07 ENCOUNTER — Emergency Department (HOSPITAL_COMMUNITY)
Admission: EM | Admit: 2020-04-07 | Discharge: 2020-04-08 | Disposition: A | Discharge status: Home / Self Care | Payer: Self-pay | Attending: Emergency Medicine | Admitting: Emergency Medicine

## 2020-04-07 ENCOUNTER — Other Ambulatory Visit: Payer: Self-pay

## 2020-04-07 DIAGNOSIS — Z20822 Contact with and (suspected) exposure to covid-19: Secondary | ICD-10-CM | POA: Insufficient documentation

## 2020-04-07 DIAGNOSIS — F329 Major depressive disorder, single episode, unspecified: Secondary | ICD-10-CM | POA: Insufficient documentation

## 2020-04-07 DIAGNOSIS — F10920 Alcohol use, unspecified with intoxication, uncomplicated: Secondary | ICD-10-CM

## 2020-04-07 DIAGNOSIS — W228XXA Striking against or struck by other objects, initial encounter: Secondary | ICD-10-CM | POA: Insufficient documentation

## 2020-04-07 DIAGNOSIS — F10120 Alcohol abuse with intoxication, uncomplicated: Secondary | ICD-10-CM | POA: Insufficient documentation

## 2020-04-07 DIAGNOSIS — R45851 Suicidal ideations: Secondary | ICD-10-CM | POA: Insufficient documentation

## 2020-04-07 DIAGNOSIS — F1014 Alcohol abuse with alcohol-induced mood disorder: Secondary | ICD-10-CM

## 2020-04-07 DIAGNOSIS — I10 Essential (primary) hypertension: Secondary | ICD-10-CM | POA: Insufficient documentation

## 2020-04-07 DIAGNOSIS — F17210 Nicotine dependence, cigarettes, uncomplicated: Secondary | ICD-10-CM | POA: Insufficient documentation

## 2020-04-07 DIAGNOSIS — S61401A Unspecified open wound of right hand, initial encounter: Secondary | ICD-10-CM | POA: Insufficient documentation

## 2020-04-07 DIAGNOSIS — Y908 Blood alcohol level of 240 mg/100 ml or more: Secondary | ICD-10-CM | POA: Insufficient documentation

## 2020-04-07 DIAGNOSIS — Z79899 Other long term (current) drug therapy: Secondary | ICD-10-CM | POA: Insufficient documentation

## 2020-04-07 LAB — COMPREHENSIVE METABOLIC PANEL
ALT: 26 U/L (ref 0–44)
AST: 37 U/L (ref 15–41)
Albumin: 3.9 g/dL (ref 3.5–5.0)
Alkaline Phosphatase: 47 U/L (ref 38–126)
Anion gap: 11 (ref 5–15)
BUN: 5 mg/dL — ABNORMAL LOW (ref 6–20)
CO2: 26 mmol/L (ref 22–32)
Calcium: 8.7 mg/dL — ABNORMAL LOW (ref 8.9–10.3)
Chloride: 106 mmol/L (ref 98–111)
Creatinine, Ser: 1.01 mg/dL (ref 0.61–1.24)
GFR calc Af Amer: >60 mL/min (ref >60)
GFR calc non Af Amer: >60 mL/min (ref >60)
Glucose, Bld: 92 mg/dL (ref 70–99)
Potassium: 3.9 mmol/L (ref 3.5–5.1)
Sodium: 143 mmol/L (ref 135–145)
Total Bilirubin: 0.7 mg/dL (ref 0.3–1.2)
Total Protein: 7.9 g/dL (ref 6.5–8.1)

## 2020-04-07 LAB — CBC
HCT: 41.4 % (ref 39.0–52.0)
Hemoglobin: 13.7 g/dL (ref 13.0–17.0)
MCH: 29.1 pg (ref 26.0–34.0)
MCHC: 33.1 g/dL (ref 30.0–36.0)
MCV: 88.1 fL (ref 80.0–100.0)
Platelets: 194 10*3/uL (ref 150–400)
RBC: 4.7 MIL/uL (ref 4.22–5.81)
RDW: 15.7 % — ABNORMAL HIGH (ref 11.5–15.5)
WBC: 4 10*3/uL (ref 4.0–10.5)
nRBC: 0 % (ref 0.0–0.2)

## 2020-04-07 LAB — SALICYLATE LEVEL: Salicylate Lvl: <7 mg/dL — ABNORMAL LOW (ref 7.0–30.0)

## 2020-04-07 LAB — ETHANOL: Alcohol, Ethyl (B): 313 mg/dL (ref <10)

## 2020-04-07 LAB — ACETAMINOPHEN LEVEL: Acetaminophen (Tylenol), Serum: <10 ug/mL — ABNORMAL LOW (ref 10–30)

## 2020-04-07 MED ORDER — LORAZEPAM 2 MG/ML IJ SOLN: 0.0000 mg | Freq: Two times a day (BID) | INTRAMUSCULAR | Status: DC

## 2020-04-07 MED ORDER — THIAMINE HCL 100 MG PO TABS
100.0000 mg | ORAL_TABLET | Freq: Every day | ORAL | Status: DC
  Administered 2020-04-07 – 2020-04-08 (×2): 100 mg via ORAL
  Filled 2020-04-07 (×2): qty 1

## 2020-04-07 MED ORDER — THIAMINE HCL 100 MG/ML IJ SOLN
100.0000 mg | Freq: Every day | INTRAMUSCULAR | Status: DC
  Filled 2020-04-07: qty 2

## 2020-04-07 MED ORDER — LORAZEPAM 1 MG PO TABS: 0.0000 mg | ORAL_TABLET | Freq: Two times a day (BID) | ORAL | Status: DC

## 2020-04-07 MED ORDER — LORAZEPAM 1 MG PO TABS
0.0000 mg | ORAL_TABLET | Freq: Four times a day (QID) | ORAL | Status: DC
  Administered 2020-04-08: 1 mg via ORAL
  Filled 2020-04-07: qty 1

## 2020-04-07 MED ORDER — FOLIC ACID 1 MG PO TABS: 1.0000 mg | ORAL_TABLET | Freq: Once | ORAL | Status: DC

## 2020-04-07 MED ORDER — LORAZEPAM 2 MG/ML IJ SOLN
0.0000 mg | Freq: Four times a day (QID) | INTRAMUSCULAR | Status: DC
  Administered 2020-04-07: 1 mg via INTRAVENOUS
  Administered 2020-04-08: 2 mg via INTRAVENOUS
  Filled 2020-04-07 (×2): qty 1
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
## 2020-04-08 LAB — RAPID URINE DRUG SCREEN, HOSP PERFORMED
Amphetamines: NOT DETECTED
Barbiturates: NOT DETECTED
Benzodiazepines: NOT DETECTED
Cocaine: POSITIVE — AB
Opiates: NOT DETECTED
Tetrahydrocannabinol: NOT DETECTED

## 2020-04-08 LAB — SARS CORONAVIRUS 2 BY RT PCR (HOSPITAL ORDER, PERFORMED IN ~~LOC~~ HOSPITAL LAB): SARS Coronavirus 2: NEGATIVE

## 2020-04-08 NOTE — Progress Notes (Signed)
Per DNP Lord - patient is to be psych cleared.  Full notes to be added as soon as possible.

## 2020-04-08 NOTE — ED Notes (Addendum)
Per Kellie Simmering RN and Donzetta Sprung DNP, this pt will be psyched cleared. Nurse to procure Safe Transport for pt to go to inpatient facility  [facility].

## 2020-04-08 NOTE — Consult Note (Signed)
[facility] Psych ED Discharge  04/08/2020 1:34 PM Alvin Warren  MRN:  [id number] Principal Problem: Alcohol abuse with alcohol-induced mood disorder [facility]) Discharge Diagnoses: Principal Problem:   Alcohol abuse with alcohol-induced mood disorder ([place])  Subjective: "I need help (substance use)"  Patient seen and evaluated in person by this provider.  He reports he wants help for alcohol and cocaine use.  If he gets help, he will not be so depressed or suicidal.  No current suicidal ideations or homicidal ideations, hallucinations, withdrawal symptoms. He would like to go to [facility] again, Peer Support consulted.  Beds available at [facility] in [place] and agreeable to transfer per [facility] [facility].  Contracts for safety and agreeable to the plan.  Psychiatrically stable for rehab transfer.  Total Time spent with patient: 45 minutes  Past Psychiatric History: depression, substance use d/o  Past Medical History:  Past Medical History:  Diagnosis Date  . Alcohol abuse   . Alcohol withdrawal seizure ([place])   . Drug abuse and dependence ([place])   . Hypertension     Past Surgical History:  Procedure Laterality Date  . BACK SURGERY     Family History:  Family History  Problem Relation Age of Onset  . Heart failure Mother    Family Psychiatric  History: none Social History:  Social History   Substance and Sexual Activity  Alcohol Use Yes   Comment: pt drink 4 40s a day     Social History   Substance and Sexual Activity  Drug Use Yes  . Types: "Crack" cocaine, Cocaine   Comment: Not sure of last use    Social History   Socioeconomic History  . Marital status: Significant Other    Spouse name: Not on file  . Number of children: Not on file  . Years of education: Not on file  . Highest education level: Not on file  Occupational History  . Not on file  Tobacco Use  . Smoking status: Current Every Day Smoker    Packs/day: 0.50    Types: Cigarettes  . Smokeless tobacco: Never Used   Vaping Use  . Vaping Use: Never used  Substance and Sexual Activity  . Alcohol use: Yes    Comment: pt drink 4 40s a day  . Drug use: Yes    Types: "Crack" cocaine, Cocaine    Comment: Not sure of last use  . Sexual activity: Yes    Birth control/protection: Condom  Other Topics Concern  . Not on file  Social History Narrative  . Not on file   Social Determinants of Health   Financial Resource Strain:   . Difficulty of Paying Living Expenses: Not on file  Food Insecurity:   . Worried About Charity fundraiser in the Last Year: Not on file  . Ran Out of Food in the Last Year: Not on file  Transportation Needs:   . Lack of Transportation (Medical): Not on file  . Lack of Transportation (Non-Medical): Not on file  Physical Activity:   . Days of Exercise per Week: Not on file  . Minutes of Exercise per Session: Not on file  Stress:   . Feeling of Stress : Not on file  Social Connections:   . Frequency of Communication with Friends and Family: Not on file  . Frequency of Social Gatherings with Friends and Family: Not on file  . Attends Religious Services: Not on file  . Active Member of Clubs or Organizations: Not on file  . Attends  Club or Organization Meetings: Not on file  . Marital Status: Not on file    Has this patient used any form of tobacco in the last 30 days? (Cigarettes, Smokeless Tobacco, Cigars, and/or Pipes) A prescription for an FDA-approved tobacco cessation medication was offered at discharge and the patient refused  Current Medications: Current Facility-Administered Medications  Medication Dose Route Frequency Provider Last Rate Last Admin  . folic acid (FOLVITE) tablet 1 mg  1 mg Oral Once Nuala Alpha A, PA-C      . LORazepam (ATIVAN) injection 0-4 mg  0-4 mg Intravenous Q6H Morelli, Brandon A, PA-C   2 mg at 04/08/20 0115   Or  . LORazepam (ATIVAN) tablet 0-4 mg  0-4 mg Oral Q6H Morelli, Brandon A, PA-C   1 mg at 04/08/20 0806  . [START ON  04/10/2020] LORazepam (ATIVAN) injection 0-4 mg  0-4 mg Intravenous Q12H Morelli, Brandon A, PA-C       Or  . Derrill Memo ON 04/10/2020] LORazepam (ATIVAN) tablet 0-4 mg  0-4 mg Oral Q12H Morelli, Brandon A, PA-C      . thiamine tablet 100 mg  100 mg Oral Daily Nuala Alpha A, PA-C   100 mg at 04/08/20 1050   Or  . thiamine (B-1) injection 100 mg  100 mg Intravenous Daily Nuala Alpha A, PA-C       Current Outpatient Medications  Medication Sig Dispense Refill  . gabapentin (NEURONTIN) 300 MG capsule Take 1 capsule (300 mg total) by mouth 3 (three) times daily. (Patient not taking: Reported on 04/02/2020) 90 capsule 0  . hydrochlorothiazide (HYDRODIURIL) 25 MG tablet Take 1 tablet (25 mg total) by mouth daily. (Patient not taking: Reported on 04/02/2020) 30 tablet 0  . losartan (COZAAR) 25 MG tablet Take 1 tablet (25 mg total) by mouth daily. (Patient not taking: Reported on 04/02/2020) 30 tablet 0   PTA Medications: (Not in a hospital admission)   Musculoskeletal: Strength & Muscle Tone: within normal limits Gait & Station: normal Patient leans: N/A  Psychiatric Specialty Exam: Physical Exam Vitals and nursing note reviewed.  Constitutional:      Appearance: Normal appearance.  HENT:     Head: Normocephalic.     Nose: Nose normal.  Pulmonary:     Effort: Pulmonary effort is normal.  Musculoskeletal:        General: Normal range of motion.     Cervical back: Normal range of motion.  Neurological:     General: No focal deficit present.     Mental Status: He is alert and oriented to person, place, and time.     Review of Systems  Psychiatric/Behavioral: Positive for dysphoric mood.  All other systems reviewed and are negative.   Blood pressure (!) 154/100, pulse 79, temperature 98.6 F (37 C), temperature source Oral, resp. rate 18, SpO2 99 %.There is no height or weight on file to calculate BMI.  General Appearance: Casual  Eye Contact:  Good  Speech:  Normal Rate   Volume:  Normal  Mood:  Depressed  Affect:  Congruent  Thought Process:  Coherent and Descriptions of Associations: Intact  Orientation:  Full (Time, Place, and Person)  Thought Content:  WDL and Logical  Suicidal Thoughts:  No  Homicidal Thoughts:  No  Memory:  Immediate;   Good Recent;   Good Remote;   Good  Judgement:  Fair  Insight:  Fair  Psychomotor Activity:  Decreased  Concentration:  Concentration: Good and Attention Span: Good  Recall:  Good  Fund of Knowledge:  Good  Language:  Good  Akathisia:  No  Handed:  Right  AIMS (if indicated):     Assets:  Leisure Time Physical Health Resilience Social Support  ADL's:  Intact  Cognition:  WNL  Sleep:        Demographic Factors:  Male and Unemployed  Loss Factors: Financial problems/change in socioeconomic status  Historical Factors: Impulsivity  Risk Reduction Factors:   Sense of responsibility to family, Living with another person, especially a relative and Positive social support  Continued Clinical Symptoms:  Depression, anxiety mild  Cognitive Features That Contribute To Risk:  None    Suicide Risk:  Minimal: No identifiable suicidal ideation.  Patients presenting with no risk factors but with morbid ruminations; may be classified as minimal risk based on the severity of the depressive symptoms    Plan Of Care/Follow-up recommendations:  Alcohol use disorder, severe and Cocaine use d/o, moderate: -Admit to detox/rehab at [facility] in [place]  Activity:  as tolerated Diet:  heart healthy diet  Disposition: discharge to transport to [facility] for rehab Waylan Boga, NP 04/08/2020, 1:34 PM

## 2020-04-08 NOTE — ED Notes (Signed)
Breakfast Ordered 

## 2020-04-08 NOTE — ED Notes (Signed)
Earlie Lou from [employer] called for more information on patient. Asked to fax pts [facility] for possible acceptance.

## 2020-04-08 NOTE — ED Notes (Addendum)
Pt requesting to leave and obtained own transportation to [facility]. [facility] notified as well MD Ray.

## 2020-04-08 NOTE — ED Notes (Signed)
Patient verbalizes understanding of discharge instructions. Opportunity for questioning and answers were provided. Armband removed by staff, pt discharged from ED and left with Safe Transport attendant Georgina Snell to go to [facility] In[facility] facilities.

## 2020-04-08 NOTE — Discharge Instructions (Signed)
Alcohol Use Disorder °Alcohol use disorder is when your drinking disrupts your daily life. When you have this condition, you drink too much alcohol and you cannot control your drinking. °Alcohol use disorder can cause serious problems with your physical health. It can affect your brain, heart, liver, pancreas, immune system, stomach, and intestines. Alcohol use disorder can increase your risk for certain cancers and cause problems with your mental health, such as depression, anxiety, psychosis, delirium, and dementia. People with this disorder risk hurting themselves and others. °What are the causes? °This condition is caused by drinking too much alcohol over time. It is not caused by drinking too much alcohol only one or two times. Some people with this condition drink alcohol to cope with or escape from negative life events. Others drink to relieve pain or symptoms of mental illness. °What increases the risk? °You are more likely to develop this condition if: °· You have a family history of alcohol use disorder. °· Your culture encourages drinking to the point of intoxication, or makes alcohol easy to get. °· You had a mood or conduct disorder in childhood. °· You have been a victim of abuse. °· You are an adolescent and: °? You have poor grades or difficulties in school. °? Your caregivers do not talk to you about saying no to alcohol, or supervise your activities. °? You are impulsive or you have trouble with self-control. °What are the signs or symptoms? °Symptoms of this condition include: °· Drinking more than you want to. °· Drinking for longer than you want to. °· Trying several times to drink less or to control your drinking. °· Spending a lot of time getting alcohol, drinking, or recovering from drinking. °· Craving alcohol. °· Having problems at work, at school, or at home due to drinking. °· Having problems in relationships due to drinking. °· Drinking when it is dangerous to drink, such as before  driving a car. °· Continuing to drink even though you know you might have a physical or mental problem related to drinking. °· Needing more and more alcohol to get the same effect you want from the alcohol (building up tolerance). °· Having symptoms of withdrawal when you stop drinking. Symptoms of withdrawal include: °? Fatigue. °? Nightmares. °? Trouble sleeping. °? Depression. °? Anxiety. °? Fever. °? Seizures. °? Severe confusion. °? Feeling or seeing things that are not there (hallucinations). °? Tremors. °? Rapid heart rate. °? Rapid breathing. °? High blood pressure. °· Drinking to avoid symptoms of withdrawal. °How is this diagnosed? °This condition is diagnosed with an assessment. Your health care provider may start the assessment by asking three or four questions about your drinking. °Your health care provider may perform a physical exam or do lab tests to see if you have physical problems resulting from alcohol use. She or he may refer you to a mental health professional for evaluation. °How is this treated? °Some people with alcohol use disorder are able to reduce their alcohol use to low-risk levels. Others need to completely quit drinking alcohol. When necessary, mental health professionals with specialized training in substance use treatment can help. Your health care provider can help you decide how severe your alcohol use disorder is and what type of treatment you need. The following forms of treatment are available: °· Detoxification. Detoxification involves quitting drinking and using prescription medicines within the first week to help lessen withdrawal symptoms. This treatment is important for people who have had withdrawal symptoms before and for heavy drinkers   who are likely to have withdrawal symptoms. Alcohol withdrawal can be dangerous, and in severe cases, it can cause death. Detoxification may be provided in a home, community, or primary care setting, or in a hospital or substance use  treatment facility. °· Counseling. This treatment is also called talk therapy. It is provided by substance use treatment counselors. A counselor can address the reasons you use alcohol and suggest ways to keep you from drinking again or to prevent problem drinking. The goals of talk therapy are to: °? Find healthy activities and ways for you to cope with stress. °? Identify and avoid the things that trigger your alcohol use. °? Help you learn how to handle cravings. °· Medicines. Medicines can help treat alcohol use disorder by: °? Decreasing alcohol cravings. °? Decreasing the positive feeling you have when you drink alcohol. °? Causing an uncomfortable physical reaction when you drink alcohol (aversion therapy). °· Support groups. Support groups are led by people who have quit drinking. They provide emotional support, advice, and guidance. °These forms of treatment are often combined. Some people with this condition benefit from a combination of treatments provided by specialized substance use treatment centers. °Follow these instructions at home: °· Take over-the-counter and prescription medicines only as told by your health care provider. °· Check with your health care provider before starting any new medicines. °· Ask friends and family members not to offer you alcohol. °· Avoid situations where alcohol is served, including gatherings where others are drinking alcohol. °· Create a plan for what to do when you are tempted to use alcohol. °· Find hobbies or activities that you enjoy that do not include alcohol. °· Keep all follow-up visits as told by your health care provider. This is important. °How is this prevented? °· If you drink, limit alcohol intake to no more than 1 drink a day for nonpregnant women and 2 drinks a day for men. One drink equals 12 oz of beer, 5 oz of wine, or 1½ oz of hard liquor. °· If you have a mental health condition, get treatment and support. °· Do not give alcohol to  adolescents. °· If you are an adolescent: °? Do not drink alcohol. °? Do not be afraid to say no if someone offers you alcohol. Speak up about why you do not want to drink. You can be a positive role model for your friends and set a good example for those around you by not drinking alcohol. °? If your friends drink, spend time with others who do not drink alcohol. Make new friends who do not use alcohol. °? Find healthy ways to manage stress and emotions, such as meditation or deep breathing, exercise, spending time in nature, listening to music, or talking with a trusted friend or family member. °Contact a health care provider if: °· You are not able to take your medicines as told. °· Your symptoms get worse. °· You return to drinking alcohol (relapse) and your symptoms get worse. °Get help right away if: °· You have thoughts about hurting yourself or others. °If you ever feel like you may hurt yourself or others, or have thoughts about taking your own life, get help right away. You can go to your nearest emergency department or call: °· Your local emergency services (911 in the [place] °· A suicide crisis helpline, such as the National Suicide Prevention Lifeline at 1-800-273-8255. This is open 24 hours a day. °Summary °· Alcohol use disorder is when your drinking disrupts your daily   life. When you have this condition, you drink too much alcohol and you cannot control your drinking. °· Treatment may include detoxification, counseling, medicine, and support groups. °· Ask friends and family members not to offer you alcohol. Avoid situations where alcohol is served. °· Get help right away if you have thoughts about hurting yourself or others. °This information is not intended to replace advice given to you by your health care provider. Make sure you discuss any questions you have with your health care provider. °Document Revised: 06/20/2017 Document Reviewed: 04/04/2016 °Elsevier Patient Education © 2020 [employer] ° °

## 2020-04-08 NOTE — ED Notes (Signed)
Lunch tray ordered 

## 2020-04-08 NOTE — ED Notes (Signed)
[facility] called and pt does meet inpt criteria but no beds available at this time. Also girl friend is a patient at [facility]. Dr. Betsey Holiday made aware.
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
## 2020-04-08 NOTE — ED Notes (Signed)
TTS in progress 

## 2020-12-20 HISTORY — PX: HERNIA REPAIR: SHX51

## 2021-07-03 IMAGING — CR CHEST - 2 VIEW
2 series · 2 of 2 positions shown · non-contrast
Comparison: PA and lateral chest 02/14/2019 in 11/06/2018.

CLINICAL DATA: Chest pain for 2 weeks.  History of substance abuse.

EXAM:
CHEST - 2 VIEW

[chest lat]
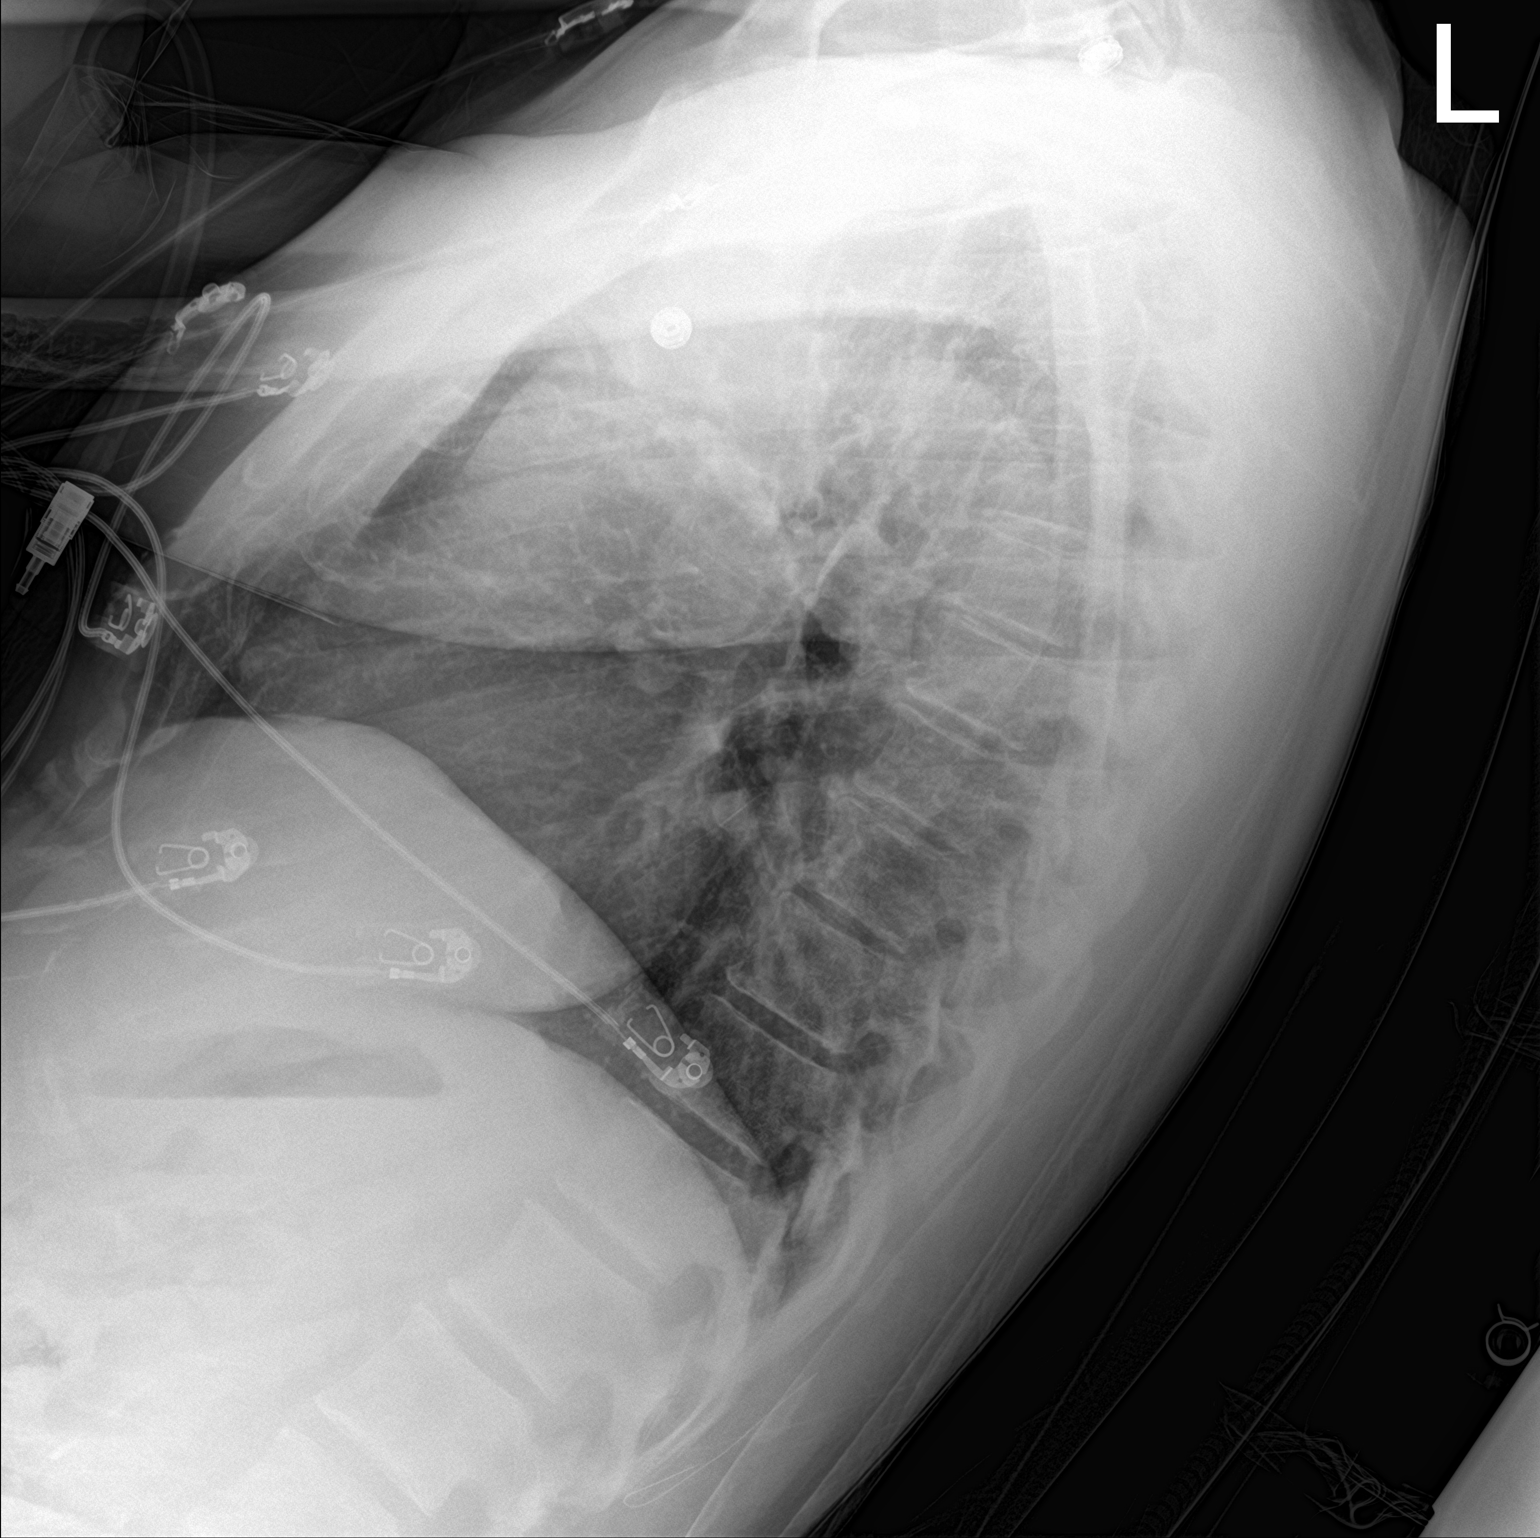

[chest ap]
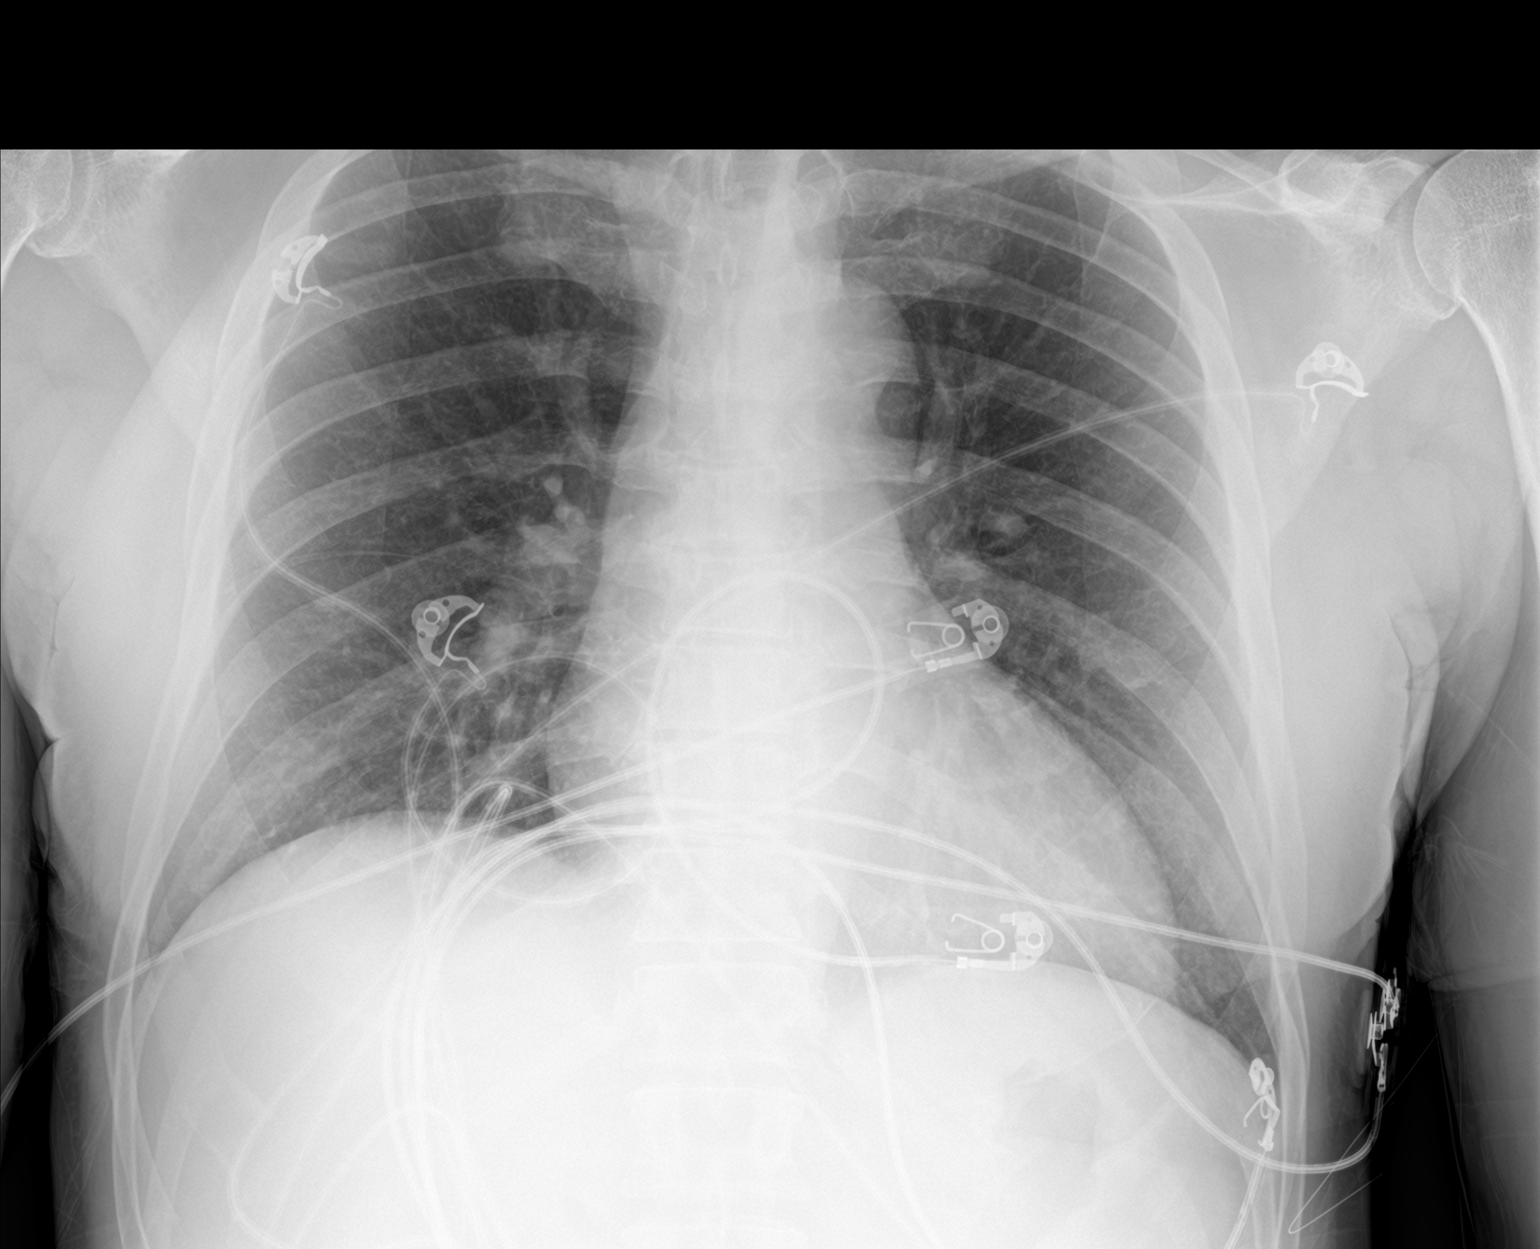

[2 of 2 positions shown; findings below may reference images not displayed]

FINDINGS: Lungs clear. Heart size normal. No pneumothorax or pleural fluid. No
bony abnormality.
IMPRESSION: Negative chest.

## 2021-12-13 ENCOUNTER — Encounter: Payer: Self-pay | Admitting: Pharmacy Technician

## 2021-12-13 NOTE — Patient Outreach (Signed)
Received updated proof of income.  Patient eligible to receive medication assistance at [facility] until time for re-certification in 2024, and as long as eligibility requirements continue to be met.   J.  Care Manager [facility]  

## 2021-12-14 ENCOUNTER — Other Ambulatory Visit: Payer: Self-pay

## 2022-01-16 ENCOUNTER — Other Ambulatory Visit: Payer: Self-pay

## 2022-01-16 ENCOUNTER — Encounter: Payer: Self-pay | Admitting: Gerontology

## 2022-01-16 ENCOUNTER — Ambulatory Visit: Payer: Self-pay | Admitting: Gerontology

## 2022-01-16 VITALS — BP 152/90 | HR 70 | Temp 98.0°F | Resp 16 | Ht 74.0 in | Wt 220.2 lb

## 2022-01-16 DIAGNOSIS — Z720 Tobacco use: Secondary | ICD-10-CM

## 2022-01-16 DIAGNOSIS — R109 Unspecified abdominal pain: Secondary | ICD-10-CM | POA: Insufficient documentation

## 2022-01-16 DIAGNOSIS — Z7689 Persons encountering health services in other specified circumstances: Secondary | ICD-10-CM

## 2022-01-16 DIAGNOSIS — I10 Essential (primary) hypertension: Secondary | ICD-10-CM

## 2022-01-16 MED ORDER — AMLODIPINE BESYLATE 5 MG PO TABS
5.0000 mg | ORAL_TABLET | Freq: Every day | ORAL | 0 refills | Status: DC
  Filled 2022-01-16: qty 30, 30d supply, fill #0

## 2022-01-16 NOTE — Progress Notes (Signed)
New Patient Office Visit  Subjective    Patient ID: Alvin Warren, male    DOB: Sep 20, 1960  Age: 61 y.o. MRN: [id number]  CC:  Chief Complaint  Patient presents with   Establish Care   Hypertension    Patient has a history of HTN. Patient was on medication (Lisinopril), but had reaction. Patient never started another HTN med. He was trying to control and exercise.    HPI Alvin Warren is a 61 y/o male who has history of hypertension, alcohol and substance use disorder presents to establish care. Currently, he resides at [facility] for rehabilitation. He has a history of hypertension, blood pressure was 152/90 , not on any antihypertensive medication and  was allergic to lisinopril. He smokes 1/2 pack of cigarette daily and admits the desire to quit. Had  laparoscopic bilateral inguinal hernia repair with mesh done on 01/19/21 at [facility] in [place] by Dr Marjie Skiff . Currently, he reports intermittent discomfort, which he states that his stomach growls sometimes and intermittent dull sharp pain to mid abdominal quadrant that happens 2-3 times weekly. He denies constipation, diarrhea, bloating, nausea or vomiting. He request colonoscopy to be done. Overall, he states that he's doing well and offers no further complaint.   Outpatient Encounter Medications as of 01/16/2022  Medication Sig   amLODipine (NORVASC) 5 MG tablet Take 1 tablet (5 mg total) by mouth daily.   No facility-administered encounter medications on file as of 01/16/2022.    Past Medical History:  Diagnosis Date   Alcohol abuse    Alcohol withdrawal seizure ([place])    Drug abuse and dependence ([place])    Hypertension     Past Surgical History:  Procedure Laterality Date   BACK SURGERY     HERNIA REPAIR Bilateral 12/2020    Family History  Problem Relation Age of Onset   Heart failure Mother    Heart disease Mother        Stent x 1   Alcohol abuse Father    Cirrhosis Father    Other Maternal Grandmother         unknown medical history   Cancer Maternal Grandfather    Other Paternal Grandmother        unknown medical history   Other Paternal Grandfather        unknown medical history    Social History   Socioeconomic History   Marital status: Significant Other    Spouse name: Not on file   Number of children: Not on file   Years of education: Not on file   Highest education level: Not on file  Occupational History   Not on file  Tobacco Use   Smoking status: Every Day    Packs/day: 0.50    Types: Cigarettes   Smokeless tobacco: Never   Tobacco comments:    Trying to cut back, smokes 1/4 to 1/2 ppd, [place] Quit info given    Resides at [facility] for alcohol abuse  Vaping Use   Vaping Use: Never used  Substance and Sexual Activity   Alcohol use: Not Currently    Comment: pt drink 4 40s a day, last use 11/09/21   Drug use: Not Currently    Types: "Crack" cocaine, Cocaine    Comment: last 07/2021   Sexual activity: Yes    Birth control/protection: Condom  Other Topics Concern   Not on file  Social History Narrative   Not on file   Social Determinants of Health   Financial  Resource Strain: Not on file  Food Insecurity: No Food Insecurity (01/16/2022)   Hunger Vital Sign    Worried About Running Out of Food in the Last Year: Never true    Ran Out of Food in the Last Year: Never true  Transportation Needs: No Transportation Needs (01/16/2022)   PRAPARE - Hydrologist (Medical): No    Lack of Transportation (Non-Medical): No  Physical Activity: Not on file  Stress: Not on file  Social Connections: Not on file  Intimate Partner Violence: Not on file    Review of Systems  Constitutional: Negative.   HENT: Negative.    Eyes: Negative.   Respiratory: Negative.    Cardiovascular: Negative.   Gastrointestinal: Negative.   Genitourinary: Negative.   Musculoskeletal: Negative.   Skin: Negative.   Neurological: Negative.   Endo/Heme/Allergies: Negative.    Psychiatric/Behavioral: Negative.          Objective    BP (!) 152/90 (BP Location: Right Arm, Patient Position: Sitting, Cuff Size: Large)   Pulse 70   Temp 98 F (36.7 C) (Oral)   Resp 16   Ht '6\' 2"'$  (1.88 m)   Wt 220 lb 3.2 oz (99.9 kg)   SpO2 98%   BMI 28.27 kg/m   Physical Exam HENT:     Head: Normocephalic and atraumatic.     Nose: Nose normal.     Mouth/Throat:     Mouth: Mucous membranes are moist.  Eyes:     Extraocular Movements: Extraocular movements intact.     Conjunctiva/sclera: Conjunctivae normal.     Pupils: Pupils are equal, round, and reactive to light.  Cardiovascular:     Rate and Rhythm: Normal rate and regular rhythm.     Pulses: Normal pulses.     Heart sounds: Normal heart sounds.  Pulmonary:     Effort: Pulmonary effort is normal.     Breath sounds: Normal breath sounds.  Abdominal:     General: Abdomen is flat. Bowel sounds are normal.     Palpations: Abdomen is soft.  Genitourinary:    Comments: Deferred per patient. Musculoskeletal:        General: Normal range of motion.     Cervical back: Normal range of motion.  Skin:    General: Skin is warm.  Neurological:     General: No focal deficit present.     Mental Status: He is alert and oriented to person, place, and time. Mental status is at baseline.  Psychiatric:        Mood and Affect: Mood normal.        Behavior: Behavior normal.        Thought Content: Thought content normal.        Judgment: Judgment normal.         Assessment & Plan:   1. Essential hypertension -Blood pressure is not under control, he was started on amlodipine 5 daily.  He was educated on medication side effects and was advised to notify clinic.  He was encouraged to continue on DASH diet and exercise as tolerated. - amLODipine (NORVASC) 5 MG tablet; Take 1 tablet (5 mg total) by mouth daily.  Dispense: 30 tablet; Refill: 0  2. Tobacco abuse -He was encouraged on smoking cessation and was provided  with the [place] quit line information and advised to call.  3. Abdominal discomfort -He was encouraged to monitor abdominal discomfort and to notify clinic with worsening symptoms and also to go to  the emergency room.  4. Encounter to establish care -Routine labs will be checked. - Lipid panel; Future - HgB A1c; Future - Ambulatory referral to Gastroenterology - HgB A1c - Lipid panel   Return in about 29 days (around 02/14/2022), or if symptoms worsen or fail to improve.   Allana Shrestha Jerold Coombe, NP

## 2022-01-16 NOTE — Patient Instructions (Signed)

## 2022-01-17 ENCOUNTER — Other Ambulatory Visit: Payer: Self-pay | Admitting: Gerontology

## 2022-01-17 ENCOUNTER — Other Ambulatory Visit: Payer: Self-pay

## 2022-01-17 DIAGNOSIS — R7303 Prediabetes: Secondary | ICD-10-CM

## 2022-01-17 LAB — LIPID PANEL
Chol/HDL Ratio: 3.3 ratio (ref 0.0–5.0)
Cholesterol, Total: 147 mg/dL (ref 100–199)
HDL: 44 mg/dL (ref >39)
LDL Chol Calc (NIH): 89 mg/dL (ref 0–99)
Triglycerides: 72 mg/dL (ref 0–149)
VLDL Cholesterol Cal: 14 mg/dL (ref 5–40)

## 2022-01-17 LAB — HEMOGLOBIN A1C
Est. average glucose Bld gHb Est-mCnc: 134 mg/dL
Hgb A1c MFr Bld: 6.3 % — ABNORMAL HIGH (ref 4.8–5.6)

## 2022-01-17 MED ORDER — METFORMIN HCL 500 MG PO TABS
500.0000 mg | ORAL_TABLET | Freq: Two times a day (BID) | ORAL | 0 refills | Status: DC
Start: 2022-01-17 — End: 2022-03-21
  Filled 2022-01-17: qty 120, 60d supply, fill #0

## 2022-01-23 ENCOUNTER — Ambulatory Visit: Payer: Self-pay | Admitting: Nurse Practitioner

## 2022-01-23 ENCOUNTER — Encounter: Payer: Self-pay | Admitting: Nurse Practitioner

## 2022-01-23 DIAGNOSIS — Z113 Encounter for screening for infections with a predominantly sexual mode of transmission: Secondary | ICD-10-CM

## 2022-01-23 DIAGNOSIS — Z205 Contact with and (suspected) exposure to viral hepatitis: Secondary | ICD-10-CM | POA: Insufficient documentation

## 2022-01-23 DIAGNOSIS — E119 Type 2 diabetes mellitus without complications: Secondary | ICD-10-CM | POA: Insufficient documentation

## 2022-01-23 LAB — HEPATITIS B SURFACE ANTIGEN: Hepatitis B Surface Ag: NONREACTIVE

## 2022-01-23 LAB — HM HEPATITIS C SCREENING LAB: HM Hepatitis Screen: POSITIVE

## 2022-01-23 NOTE — Progress Notes (Signed)
[facility] Department STI clinic/screening visit  Subjective:  Alvin Warren is a 61 y.o. male being seen today for an STI screening visit. The patient reports they do not have symptoms.    Patient has the following medical conditions:   Patient Active Problem List   Diagnosis Date Noted   Type 2 diabetes mellitus without complications ([place]) 38/75/6433   Abdominal discomfort 01/16/2022   Encounter to establish care 01/16/2022   Alcohol abuse with alcohol-induced mood disorder ([place]) 04/02/2020   MDD (major depressive disorder) 02/04/2019   Essential hypertension 12/07/2018   Tobacco abuse 12/07/2018   ETOH abuse 12/07/2018   Angioedema of lips 12/07/2018   Acute kidney injury ([place]) 12/06/2018   Cocaine abuse ([place]) 11/07/2018     Chief Complaint  Patient presents with   SEXUALLY TRANSMITTED DISEASE    HPI  Patient reports to clinic for STD screening.  Patient is asymptomatic.   Does the patient or their partner desires a pregnancy in the next year? No  Screening for MPX risk: Does the patient have an unexplained rash? No Is the patient MSM? No Does the patient endorse multiple sex partners or anonymous sex partners? No Did the patient have close or sexual contact with a person diagnosed with MPX? No Has the patient traveled outside the [place] where MPX is endemic? No Is there a high clinical suspicion for MPX-- evidenced by one of the following No  -Unlikely to be chickenpox  -Lymphadenopathy  -Rash that present in same phase of evolution on any given body part   See flowsheet for further details and programmatic requirements.   Immunization History  Administered Date(s) Administered   Hep A / Hep B 04/06/2004   Hepatitis A 11/15/2002, 12/15/2002   Hepatitis A, Adult 12/02/2017   Hepatitis B 11/15/2002, 12/15/2002   Janssen (J&J) SARS-COV-2 Vaccination 06/22/2020   Tdap 01/18/2012     The following portions of the patient's history were reviewed and  updated as appropriate: allergies, current medications, past medical history, past social history, past surgical history and problem list.  Objective:  There were no vitals filed for this visit.  Physical Exam Constitutional:      Appearance: Normal appearance.  HENT:     Head: Normocephalic. No abrasion, masses or laceration. Hair is normal.     Mouth/Throat:     Mouth: No oral lesions.     Dentition: No dental caries.     Pharynx: No pharyngeal swelling, oropharyngeal exudate, posterior oropharyngeal erythema or uvula swelling.     Tonsils: No tonsillar exudate or tonsillar abscesses.     Comments: Dentures present at top  Eyes:     General: Lids are normal.        Right eye: No discharge.        Left eye: No discharge.     Conjunctiva/sclera: Conjunctivae normal.     Right eye: No exudate.    Left eye: No exudate. Abdominal:     General: Abdomen is flat.     Palpations: Abdomen is soft.     Tenderness: There is no abdominal tenderness. There is no rebound.  Genitourinary:    Pubic Area: No rash or pubic lice.      Penis: Normal and circumcised. No erythema or discharge.      Testes: Normal.        Right: Mass or tenderness not present.        Left: Mass or tenderness not present.     Rectum: Normal.  Comments: Discharge amount: None Color: None Musculoskeletal:     Cervical back: Full passive range of motion without pain, normal range of motion and neck supple.  Lymphadenopathy:     Cervical: No cervical adenopathy.     Right cervical: No superficial, deep or posterior cervical adenopathy.    Left cervical: No superficial, deep or posterior cervical adenopathy.     Upper Body:     Right upper body: No supraclavicular, axillary or epitrochlear adenopathy.     Left upper body: No supraclavicular, axillary or epitrochlear adenopathy.     Lower Body: No right inguinal adenopathy. No left inguinal adenopathy.  Skin:    General: Skin is warm and dry.     Findings: No  lesion or rash.  Neurological:     Mental Status: He is alert and oriented to person, place, and time.  Psychiatric:        Attention and Perception: Attention normal.        Mood and Affect: Mood normal.        Speech: Speech normal.        Behavior: Behavior normal. Behavior is cooperative.       Assessment and Plan:  Alvin Warren is a 61 y.o. male presenting to the [facility] Department for STI screening  1. Screening examination for venereal disease -61 year old male in clinic today for STD screening. -Patient does not have STI symptoms Patient accepted all screenings including oral GC, urine CT/GC and bloodwork for HIV/RPR.  Patient meets criteria for HepB screening? Yes. Ordered? Yes Patient meets criteria for HepC screening? Yes. Ordered? Yes Recommended condom use with all sex Discussed importance of condom use for STI prevent   Discussed time line for State Lab results and that patient will be called with positive results and encouraged patient to call if he had not heard in 2 weeks Recommended returning for continued or worsening symptoms.    - HIV/HCV [place] [facility] - Syphilis Serology, [place] [facility] - HBV Antigen/Antibody State [facility] - Chlamydia/GC NAA, Confirmation - Gonococcus culture     Return if symptoms worsen or fail to improve.  Future Appointments  Date Time Provider [place]  02/13/2022  2:30 PM [place], Lonny Prude, NP [facility] None    Gregary Cromer, FNP

## 2022-01-23 NOTE — Progress Notes (Signed)
Patient seen for STD screening, declines S/S and exposures. Condoms declined.

## 2022-01-25 LAB — CHLAMYDIA/GC NAA, CONFIRMATION
Chlamydia trachomatis, NAA: NEGATIVE
Neisseria gonorrhoeae, NAA: NEGATIVE

## 2022-01-27 LAB — GONOCOCCUS CULTURE

## 2022-01-29 ENCOUNTER — Telehealth: Payer: Self-pay

## 2022-01-29 DIAGNOSIS — Z205 Contact with and (suspected) exposure to viral hepatitis: Secondary | ICD-10-CM

## 2022-01-29 NOTE — Telephone Encounter (Signed)
Attempted to  contact pt to discuss test results. [facility]. Pt was not available. [place].    Contacting pt regarding Hep C test results from 01/25/22. Per lab results indicates prior exposure to Hep C with clearance of infection. Pt is not currently infected with Hep C. This does not mean he is protected from repeat infection.

## 2022-01-30 NOTE — Telephone Encounter (Addendum)
Pt returned phone call and confirmed password. Counseled pt regarding Hep C result from 01/23/22 specimen.  Pt states he was treated for Hep C in the past.  Added to problem list.

## 2022-02-04 ENCOUNTER — Other Ambulatory Visit: Payer: Self-pay

## 2022-02-13 ENCOUNTER — Ambulatory Visit: Payer: Self-pay | Admitting: Gerontology

## 2022-02-13 ENCOUNTER — Other Ambulatory Visit: Payer: Self-pay

## 2022-02-13 ENCOUNTER — Encounter: Payer: Self-pay | Admitting: Gerontology

## 2022-02-13 VITALS — BP 142/79 | HR 76 | Temp 98.2°F | Resp 18 | Ht 74.0 in | Wt 215.5 lb

## 2022-02-13 DIAGNOSIS — Z0000 Encounter for general adult medical examination without abnormal findings: Secondary | ICD-10-CM

## 2022-02-13 DIAGNOSIS — I10 Essential (primary) hypertension: Secondary | ICD-10-CM

## 2022-02-13 DIAGNOSIS — M25531 Pain in right wrist: Secondary | ICD-10-CM | POA: Insufficient documentation

## 2022-02-13 DIAGNOSIS — R7303 Prediabetes: Secondary | ICD-10-CM

## 2022-02-13 MED ORDER — NAPROXEN 500 MG PO TABS
500.0000 mg | ORAL_TABLET | Freq: Two times a day (BID) | ORAL | 0 refills | Status: DC
  Filled 2022-02-13: qty 30, 15d supply, fill #0

## 2022-02-13 MED ORDER — AMLODIPINE BESYLATE 5 MG PO TABS
5.0000 mg | ORAL_TABLET | Freq: Every day | ORAL | 2 refills | Status: DC
  Filled 2022-02-13: qty 30, 30d supply, fill #0
  Filled 2022-03-14: qty 30, 30d supply, fill #1
  Filled 2022-04-12: qty 30, 30d supply, fill #2

## 2022-02-13 MED ORDER — BLOOD GLUCOSE MONITOR KIT
PACK | 0 refills | Status: AC
  Filled 2022-02-13: qty 1, fill #0

## 2022-02-13 NOTE — Patient Instructions (Signed)

## 2022-02-13 NOTE — Progress Notes (Unsigned)
   Established Patient Office Visit  Subjective   Patient ID: [name], male    DOB: 1961/04/30  Age: 61 y.o. MRN: [id number]  Chief Complaint  Patient presents with   Follow-up    Questions about diabetic medication and put in paperwork about a colonoscopy; having right wrist pain if he moves it feels like pin and needles.    HPI  Byford Schools is a 61 y/o male who has history of hypertension, alcohol and substance use disorder presents for routine follow up visit. He was started on Amlodipine 5 mg, tolerates medication denies side effects, checks his blood pressure daily and it's usually less than 140/90. He also c/o right wrist pain that started 02/12/22 after carrying furniture and he twisted his right wrist. He states that pain is constant, describes pain as sharp 7/10. He states that using right hand aggravates symptoms, and applied biofreeze with minimal relief. He denies muscle and motor weakness  Review of Systems  Constitutional: Negative.   HENT: Negative.    Eyes: Negative.   Respiratory: Negative.    Cardiovascular: Negative.   Musculoskeletal:        Pain to right wrist  Neurological: Negative.   Psychiatric/Behavioral: Negative.        Objective:     BP (!) 142/79 (BP Location: Left Arm, Patient Position: Sitting, Cuff Size: Large)   Pulse 76   Temp 98.2 F (36.8 C) (Oral)   Resp 18   Ht '6\' 2"'$  (1.88 m)   Wt 215 lb 8 oz (97.8 kg)   SpO2 97%   BMI 27.67 kg/m  {Vitals History (Optional):23777}  Physical Exam   No results found for any visits on 02/13/22.  {Labs (Optional):23779}  The 10-year ASCVD risk score (Arnett DK, et al., 2019) is: 41%    Assessment & Plan:   Problem List Items Addressed This Visit   None   No follow-ups on file.    Aeon Koors Jerold Coombe, NP

## 2022-02-21 ENCOUNTER — Telehealth: Payer: Self-pay

## 2022-02-21 ENCOUNTER — Other Ambulatory Visit: Payer: Self-pay

## 2022-02-21 DIAGNOSIS — Z1211 Encounter for screening for malignant neoplasm of colon: Secondary | ICD-10-CM

## 2022-02-21 MED ORDER — NA SULFATE-K SULFATE-MG SULF 17.5-3.13-1.6 GM/177ML PO SOLN
1.0000 | Freq: Once | ORAL | 0 refills | Status: AC
  Filled 2022-02-21: qty 354, fill #0

## 2022-02-21 NOTE — Telephone Encounter (Signed)
Gastroenterology Pre-Procedure Review  Request Date: 03/18/22 Requesting Physician: Dr. Vicente Males  PATIENT REVIEW QUESTIONS: The patient responded to the following health history questions as indicated:    1. Are you having any GI issues? yes ("awareness of digestion")  2. Do you have a personal history of Polyps? no 3. Do you have a family history of Colon Cancer or Polyps? no 4. Diabetes Mellitus? yes (type 2 oral meds) 5. Joint replacements in the past 12 months? No however, Hernia mesh repair over a year ago 15. Major health problems in the past 3 months?no 7. Any artificial heart valves, MVP, or defibrillator?no    MEDICATIONS & ALLERGIES:    Patient reports the following regarding taking any anticoagulation/antiplatelet therapy:   Plavix, Coumadin, Eliquis, Xarelto, Lovenox, Pradaxa, Brilinta, or Effient? no Aspirin? no  Patient confirms/reports the following medications:  Current Outpatient Medications  Medication Sig Dispense Refill   amLODipine (NORVASC) 5 MG tablet Take 1 tablet (5 mg total) by mouth daily. 30 tablet 2   blood glucose meter kit and supplies KIT Dispense based on patient and insurance preference. Use up to four times daily as directed. (FOR ICD-9 250.00, 250.01). 1 each 0   metFORMIN (GLUCOPHAGE) 500 MG tablet Take 1 tablet (500 mg total) by mouth 2 (two) times daily with a meal. 120 tablet 0   naproxen (NAPROSYN) 500 MG tablet Take 1 tablet (500 mg total) by mouth 2 (two) times daily with a meal. 30 tablet 0   No current facility-administered medications for this visit.    Patient confirms/reports the following allergies:  Allergies  Allergen Reactions   Bee Pollen Other (See Comments) and Swelling   Penicillins Swelling, Anaphylaxis and Rash    Facial swelling Facial swelling   Lisinopril Swelling    Angioedema. SHOULD NOT be on ACEi or ARB for life.    No orders of the defined types were placed in this encounter.   AUTHORIZATION INFORMATION Primary  Insurance: 1D#: Group #:  Secondary Insurance: 1D#: Group #:  SCHEDULE INFORMATION: Date: 03/18/22 Time: Location: [place]

## 2022-03-06 ENCOUNTER — Other Ambulatory Visit: Payer: Self-pay

## 2022-03-06 MED ORDER — PEG 3350-KCL-NA BICARB-NACL 420 G PO SOLR
ORAL | 0 refills | Status: DC
  Filled 2022-03-06: qty 4000, 1d supply, fill #0

## 2022-03-08 ENCOUNTER — Other Ambulatory Visit: Payer: Self-pay

## 2022-03-14 ENCOUNTER — Other Ambulatory Visit: Payer: Self-pay

## 2022-03-15 ENCOUNTER — Encounter: Payer: Self-pay | Admitting: Gastroenterology

## 2022-03-18 ENCOUNTER — Encounter
Admission: RE | Disposition: A | Discharge status: Home / Self Care | Payer: Self-pay | Source: Ambulatory Visit | Attending: Gastroenterology

## 2022-03-18 ENCOUNTER — Ambulatory Visit: Payer: Self-pay | Admitting: Anesthesiology

## 2022-03-18 ENCOUNTER — Ambulatory Visit
Admission: RE | Admit: 2022-03-18 | Discharge: 2022-03-18 | Disposition: A | Discharge status: Home / Self Care | Payer: Self-pay | Source: Ambulatory Visit | Attending: Gastroenterology | Admitting: Gastroenterology

## 2022-03-18 DIAGNOSIS — K635 Polyp of colon: Secondary | ICD-10-CM | POA: Insufficient documentation

## 2022-03-18 DIAGNOSIS — F17210 Nicotine dependence, cigarettes, uncomplicated: Secondary | ICD-10-CM | POA: Insufficient documentation

## 2022-03-18 DIAGNOSIS — D126 Benign neoplasm of colon, unspecified: Secondary | ICD-10-CM

## 2022-03-18 DIAGNOSIS — I10 Essential (primary) hypertension: Secondary | ICD-10-CM | POA: Insufficient documentation

## 2022-03-18 DIAGNOSIS — Z1211 Encounter for screening for malignant neoplasm of colon: Secondary | ICD-10-CM | POA: Insufficient documentation

## 2022-03-18 HISTORY — PX: COLONOSCOPY WITH PROPOFOL: SHX5780

## 2022-03-18 LAB — URINE DRUG SCREEN, QUALITATIVE (ARMC ONLY)
Amphetamines, Ur Screen: NOT DETECTED
Barbiturates, Ur Screen: NOT DETECTED
Benzodiazepine, Ur Scrn: NOT DETECTED
Cannabinoid 50 Ng, Ur ~~LOC~~: NOT DETECTED
Cocaine Metabolite,Ur ~~LOC~~: NOT DETECTED
MDMA (Ecstasy)Ur Screen: NOT DETECTED
Methadone Scn, Ur: NOT DETECTED
Opiate, Ur Screen: NOT DETECTED
Phencyclidine (PCP) Ur S: NOT DETECTED
Tricyclic, Ur Screen: NOT DETECTED

## 2022-03-18 SURGERY — COLONOSCOPY WITH PROPOFOL
Anesthesia: General

## 2022-03-18 MED ORDER — LIDOCAINE HCL (CARDIAC) PF 100 MG/5ML IV SOSY
PREFILLED_SYRINGE | INTRAVENOUS | Status: DC | PRN
  Administered 2022-03-18: 100 mg via INTRAVENOUS

## 2022-03-18 MED ORDER — SODIUM CHLORIDE 0.9 % IV SOLN: INTRAVENOUS | Status: DC

## 2022-03-18 MED ORDER — PROPOFOL 10 MG/ML IV BOLUS
INTRAVENOUS | Status: DC | PRN
  Administered 2022-03-18: 100 mg via INTRAVENOUS

## 2022-03-18 MED ORDER — GLYCOPYRROLATE 0.2 MG/ML IJ SOLN
INTRAMUSCULAR | Status: DC | PRN
  Administered 2022-03-18: .2 mg via INTRAVENOUS

## 2022-03-18 MED ORDER — PROPOFOL 500 MG/50ML IV EMUL
INTRAVENOUS | Status: DC | PRN
  Administered 2022-03-18: 180 ug/kg/min via INTRAVENOUS

## 2022-03-18 MED ORDER — DEXMEDETOMIDINE HCL 200 MCG/2ML IV SOLN
INTRAVENOUS | Status: DC | PRN
  Administered 2022-03-18: 12 ug via INTRAVENOUS

## 2022-03-18 NOTE — Anesthesia Postprocedure Evaluation (Signed)
Anesthesia Post Note  Patient: Alvin Warren  Procedure(s) Performed: COLONOSCOPY WITH PROPOFOL  Patient location during evaluation: Endoscopy Anesthesia Type: General Level of consciousness: awake and alert Pain management: pain level controlled Vital Signs Assessment: post-procedure vital signs reviewed and stable Respiratory status: spontaneous breathing, nonlabored ventilation, respiratory function stable and patient connected to nasal cannula oxygen Cardiovascular status: blood pressure returned to baseline and stable Postop Assessment: no apparent nausea or vomiting Anesthetic complications: no   No notable events documented.   Last Vitals:  Vitals:   03/18/22 1226 03/18/22 1236  BP: 116/76 130/78  Pulse: 71 64  Resp: 15 18  Temp:    SpO2: 100% 100%    Last Pain:  Vitals:   03/18/22 1236  TempSrc:   PainSc: 0-No pain                 Precious Haws Savanna Dooley

## 2022-03-18 NOTE — Transfer of Care (Signed)
Immediate Anesthesia Transfer of Care Note  Patient: Alvin Warren  Procedure(s) Performed: COLONOSCOPY WITH PROPOFOL  Patient Location: Endoscopy Unit  Anesthesia Type:General  Level of Consciousness: drowsy  Airway & Oxygen Therapy: Patient Spontanous Breathing  Post-op Assessment: Report given to RN and Post -op Vital signs reviewed and stable  Post vital signs: Reviewed and stable  Last Vitals:  Vitals Value Taken Time  BP 109/63 03/18/22 1207  Temp 35.8 C 03/18/22 1206  Pulse 86 03/18/22 1208  Resp 18 03/18/22 1208  SpO2 96 % 03/18/22 1208  Vitals shown include unvalidated device data.  Last Pain:  Vitals:   03/18/22 1206  TempSrc: Temporal  PainSc: Asleep         Complications: No notable events documented.

## 2022-03-18 NOTE — Op Note (Signed)
[facility] Gastroenterology Patient Name: Alvin Warren Procedure Date: 03/18/2022 11:42 AM MRN: [id number] Account #: [id number] Date of Birth: [birth year] Admit Type: Outpatient Age: 61 Room: [facility] ENDO ROOM 1 Gender: Male Note Status: Finalized Instrument Name: Jasper Riling [id number] Procedure:             Colonoscopy Indications:           Screening for colorectal malignant neoplasm Providers:             Jonathon Bellows MD, MD Referring MD:          Marliss Coots (Referring MD) Medicines:             Monitored Anesthesia Care Complications:         No immediate complications. Procedure:             Pre-Anesthesia Assessment:                        - Prior to the procedure, a History and Physical was                         performed, and patient medications, allergies and                         sensitivities were reviewed. The patient's tolerance                         of previous anesthesia was reviewed.                        - The risks and benefits of the procedure and the                         sedation options and risks were discussed with the                         patient. All questions were answered and informed                         consent was obtained.                        - ASA Grade Assessment: II - A patient with mild                         systemic disease.                        After obtaining informed consent, the colonoscope was                         passed under direct vision. Throughout the procedure,                         the patient's blood pressure, pulse, and oxygen                         saturations were monitored continuously. The                         Colonoscope was introduced  through the anus and                         advanced to the the cecum, identified by the                         appendiceal orifice. The colonoscopy was performed                         with ease. The patient tolerated the procedure well.                          The quality of the bowel preparation was adequate. Findings:      The perianal and digital rectal examinations were normal.      Two sessile polyps were found in the ascending colon. The polyps were 4       to 5 mm in size. These polyps were removed with a jumbo cold forceps.       Resection and retrieval were complete.      The exam was otherwise without abnormality on direct and retroflexion       views. Impression:            - Two 4 to 5 mm polyps in the ascending colon, removed                         with a jumbo cold forceps. Resected and retrieved.                        - The examination was otherwise normal on direct and                         retroflexion views. Recommendation:        - Discharge patient to home (with escort).                        - Resume previous diet.                        - Continue present medications.                        - Await pathology results.                        - Repeat colonoscopy for surveillance based on                         pathology results. Procedure Code(s):     --- Professional ---                        [phone], Colonoscopy, flexible; with biopsy, single or                         multiple Diagnosis Code(s):     --- Professional ---                        Z12.11, Encounter for screening for malignant neoplasm  of colon                        K63.5, Polyp of colon CPT copyright 2019 American Medical Association. All rights reserved. The codes documented in this report are preliminary and upon coder review may  be revised to meet current compliance requirements. Jonathon Bellows, MD Jonathon Bellows MD, MD 03/18/2022 12:03:38 PM This report has been signed electronically. Number of Addenda: 0 Note Initiated On: 03/18/2022 11:42 AM Scope Withdrawal Time: 0 hours 12 minutes 28 seconds  Total Procedure Duration: 0 hours 15 minutes 9 seconds  Estimated Blood Loss:  Estimated blood loss: none.       [facility]

## 2022-03-18 NOTE — H&P (Signed)
Alvin Bellows, MD [address] [place], [place], [place], [address] [address], [place], [place], [place], [address] Phone: [phone]  Fax: [phone]  Primary Care Physician:  Marliss Coots, NP   Pre-Procedure History & Physical: HPI:  Alvin Warren is a 61 y.o. male is here for an colonoscopy.   Past Medical History:  Diagnosis Date   Alcohol abuse    Alcohol withdrawal seizure ([place])    Drug abuse and dependence ([place])    Hypertension     Past Surgical History:  Procedure Laterality Date   BACK SURGERY     HERNIA REPAIR Bilateral 12/2020    Prior to Admission medications   Medication Sig Start Date End Date Taking? Authorizing Provider  amLODipine (NORVASC) 5 MG tablet Take 1 tablet (5 mg total) by mouth daily. 02/13/22  Yes [place], Chioma E, NP  metFORMIN (GLUCOPHAGE) 500 MG tablet Take 1 tablet (500 mg total) by mouth 2 (two) times daily with a meal. 01/17/22  Yes [place], Chioma E, NP  blood glucose meter kit and supplies KIT Dispense based on patient and insurance preference. Use up to four times daily as directed. (FOR ICD-9 250.00, 250.01). 02/13/22   [place], Chioma E, NP  naproxen (NAPROSYN) 500 MG tablet Take 1 tablet (500 mg total) by mouth 2 (two) times daily with a meal. 02/13/22   I[place]ab[place]ie, Chioma E, NP  polyethylene glycol-electrolytes (NULYTELY) 420 g solution At 5pm the evening before colonoscopy fill container to the fill line with water.  Mix well.  Drink 8 oz every 15-20 mins until half has been completed.  Continue clear liquid diet. 5 hours prior to colonoscopy time resume drinking the remainder of prep until entire contents have been completed.  Do not eat or drink anything 2 hours prior to colonoscopy. 03/06/22   Alvin Bellows, MD    Allergies as of 02/21/2022 - Review Complete 02/21/2022  Allergen Reaction Noted   Bee pollen Other (See Comments) and Swelling 12/18/2019   Penicillins Swelling, Anaphylaxis, and Rash 01/10/2015    Lisinopril Swelling 12/07/2018    Family History  Problem Relation Age of Onset   Heart failure Mother    Heart disease Mother        Stent x 1   Alcohol abuse Father    Cirrhosis Father    Other Maternal Grandmother        unknown medical history   Cancer Maternal Grandfather    Other Paternal Grandmother        unknown medical history   Other Paternal Grandfather        unknown medical history    Social History   Socioeconomic History   Marital status: Significant Other    Spouse name: Not on file   Number of children: Not on file   Years of education: Not on file   Highest education level: Not on file  Occupational History   Not on file  Tobacco Use   Smoking status: Every Day    Packs/day: 0.25    Types: Cigarettes   Smokeless tobacco: Never   Tobacco comments:    Trying to cut back, smokes 1/4 to 1/2 ppd, [place] Quit info given    Resides at [facility] for alcohol abuse  Vaping Use   Vaping Use: Never used  Substance and Sexual Activity   Alcohol use: Not Currently    Comment: pt drink 4 40s a day, last use 75 days   Drug use: Not Currently  Types: "Crack" cocaine, Cocaine, Marijuana    Comment: last use 75 days   Sexual activity: Yes    Birth control/protection: Condom  Other Topics Concern   Not on file  Social History Narrative   Not on file   Social Determinants of Health   Financial Resource Strain: Not on file  Food Insecurity: No Food Insecurity (01/16/2022)   Hunger Vital Sign    Worried About Running Out of Food in the Last Year: Never true    Ran Out of Food in the Last Year: Never true  Transportation Needs: No Transportation Needs (01/16/2022)   PRAPARE - Hydrologist (Medical): No    Lack of Transportation (Non-Medical): No  Physical Activity: Not on file  Stress: Not on file  Social Connections: Not on file  Intimate Partner Violence: Not At Risk (01/23/2022)   Humiliation, Afraid, Rape, and Kick questionnaire     Fear of Current or Ex-Partner: No    Emotionally Abused: No    Physically Abused: No    Sexually Abused: No    Review of Systems: See HPI, otherwise negative ROS  Physical Exam: BP 130/78   Pulse 64   Temp (!) 96.4 F (35.8 C) (Temporal)   Resp 18   Ht '6\' 3"'  (1.905 m)   Wt 95.3 kg   SpO2 100%   BMI 26.25 kg/m  General:   Alert,  pleasant and cooperative in NAD Head:  Normocephalic and atraumatic. Neck:  Supple; no masses or thyromegaly. Lungs:  Clear throughout to auscultation, normal respiratory effort.    Heart:  +S1, +S2, Regular rate and rhythm, No edema. Abdomen:  Soft, nontender and nondistended. Normal bowel sounds, without guarding, and without rebound.   Neurologic:  Alert and  oriented x4;  grossly normal neurologically.  Impression/Plan: [name] is here for an colonoscopy to be performed for Screening colonoscopy average risk   Risks, benefits, limitations, and alternatives regarding  colonoscopy have been reviewed with the patient.  Questions have been answered.  All parties agreeable.   Alvin Bellows, MD  03/18/2022, 1:23 PM

## 2022-03-18 NOTE — Anesthesia Preprocedure Evaluation (Signed)
Anesthesia Evaluation  Patient identified by MRN, date of birth, ID band Patient awake    Reviewed: Allergy & Precautions, NPO status , Patient's Chart, lab work & pertinent test results  History of Anesthesia Complications Negative for: history of anesthetic complications  Airway Mallampati: III  TM Distance: >3 FB Neck ROM: full    Dental  (+) Chipped, Poor Dentition, Missing, Upper Dentures   Pulmonary neg shortness of breath, COPD, Current Smoker and Patient abstained from smoking.,    Pulmonary exam normal        Cardiovascular Exercise Tolerance: Good hypertension, (-) anginaNormal cardiovascular exam     Neuro/Psych Seizures -,  PSYCHIATRIC DISORDERS    GI/Hepatic negative GI ROS, neg GERD  ,(+)     substance abuse  ,   Endo/Other  diabetes, Type 2  Renal/GU Renal disease  negative genitourinary   Musculoskeletal   Abdominal   Peds  Hematology negative hematology ROS (+)   Anesthesia Other Findings Past Medical History: No date: Alcohol abuse No date: Alcohol withdrawal seizure ([place]) No date: Drug abuse and dependence ([place]) No date: Hypertension  Past Surgical History: No date: BACK SURGERY 12/2020: HERNIA REPAIR; Bilateral  BMI    Body Mass Index: 26.25 kg/m      Reproductive/Obstetrics negative OB ROS                             Anesthesia Physical Anesthesia Plan  ASA: 3  Anesthesia Plan: General   Post-op Pain Management:    Induction: Intravenous  PONV Risk Score and Plan: Propofol infusion and TIVA  Airway Management Planned: Natural Airway and Nasal Cannula  Additional Equipment:   Intra-op Plan:   Post-operative Plan:   Informed Consent: I have reviewed the patients History and Physical, chart, labs and discussed the procedure including the risks, benefits and alternatives for the proposed anesthesia with the patient or authorized representative  who has indicated his/her understanding and acceptance.     Dental Advisory Given  Plan Discussed with: Anesthesiologist, CRNA and Surgeon  Anesthesia Plan Comments: (Patient consented for risks of anesthesia including but not limited to:  - adverse reactions to medications - risk of airway placement if required - damage to eyes, teeth, lips or other oral mucosa - nerve damage due to positioning  - sore throat or hoarseness - Damage to heart, brain, nerves, lungs, other parts of body or loss of life  Patient voiced understanding.)        Anesthesia Quick Evaluation

## 2022-03-19 ENCOUNTER — Encounter: Payer: Self-pay | Admitting: Gastroenterology

## 2022-03-19 LAB — SURGICAL PATHOLOGY

## 2022-03-21 ENCOUNTER — Other Ambulatory Visit: Payer: Self-pay

## 2022-03-21 ENCOUNTER — Other Ambulatory Visit: Payer: Self-pay | Admitting: Gerontology

## 2022-03-21 DIAGNOSIS — R7303 Prediabetes: Secondary | ICD-10-CM

## 2022-03-21 MED ORDER — METFORMIN HCL 500 MG PO TABS
500.0000 mg | ORAL_TABLET | Freq: Two times a day (BID) | ORAL | 0 refills | Status: DC
  Filled 2022-03-21: qty 60, 30d supply, fill #0

## 2022-03-28 ENCOUNTER — Encounter: Payer: Self-pay | Admitting: Gastroenterology

## 2022-04-12 ENCOUNTER — Other Ambulatory Visit: Payer: Self-pay

## 2022-04-17 ENCOUNTER — Other Ambulatory Visit: Payer: Self-pay | Admitting: Gerontology

## 2022-04-17 ENCOUNTER — Other Ambulatory Visit: Payer: Self-pay

## 2022-04-17 DIAGNOSIS — I10 Essential (primary) hypertension: Secondary | ICD-10-CM

## 2022-04-17 MED ORDER — AMLODIPINE BESYLATE 5 MG PO TABS
5.0000 mg | ORAL_TABLET | Freq: Every day | ORAL | 2 refills | Status: DC
  Filled 2022-04-17 – 2022-05-13 (×2): qty 30, 30d supply, fill #0
  Filled 2022-06-08: qty 30, 30d supply, fill #1

## 2022-04-18 ENCOUNTER — Encounter: Payer: Self-pay | Admitting: Gerontology

## 2022-04-18 ENCOUNTER — Other Ambulatory Visit: Payer: Self-pay

## 2022-04-18 ENCOUNTER — Ambulatory Visit: Payer: Self-pay | Admitting: Gerontology

## 2022-04-18 VITALS — BP 148/81 | HR 65 | Temp 98.6°F | Ht 74.0 in | Wt 220.7 lb

## 2022-04-18 DIAGNOSIS — B351 Tinea unguium: Secondary | ICD-10-CM

## 2022-04-18 DIAGNOSIS — I10 Essential (primary) hypertension: Secondary | ICD-10-CM

## 2022-04-18 DIAGNOSIS — R7303 Prediabetes: Secondary | ICD-10-CM

## 2022-04-18 LAB — POCT GLYCOSYLATED HEMOGLOBIN (HGB A1C): Hemoglobin A1C: 5.8 % — AB (ref 4.0–5.6)

## 2022-04-18 LAB — GLUCOSE, POCT (MANUAL RESULT ENTRY): POC Glucose: 88 mg/dl (ref 70–99)

## 2022-04-18 MED ORDER — METFORMIN HCL 500 MG PO TABS
500.0000 mg | ORAL_TABLET | Freq: Two times a day (BID) | ORAL | 2 refills | Status: DC
  Filled 2022-04-18: qty 60, 30d supply, fill #0
  Filled 2022-06-02: qty 60, 30d supply, fill #1

## 2022-04-18 NOTE — Patient Instructions (Addendum)
Blood Glucose Monitoring, Adult Monitoring your blood sugar (glucose) is an important part of managing your diabetes. Blood glucose monitoring involves checking your blood glucose as often as directed and keeping a log or record of your results over time. Checking your blood glucose regularly and keeping a blood glucose log can: Help you and your health care provider adjust your diabetes management plan as needed, including your medicines or insulin. Help you understand how food, exercise, illnesses, and medicines affect your blood glucose. Let you know what your blood glucose is at any time. You can quickly find out if you have low blood glucose (hypoglycemia) or high blood glucose (hyperglycemia). Your health care provider will set individualized treatment goals for you. Your goals will be based on your age, other medical conditions you have, and how you respond to diabetes treatment. Generally, the goal of treatment is to maintain the following blood glucose levels: Before meals (preprandial): 80-130 mg/dL (4.4-7.2 mmol/L). After meals (postprandial): below 180 mg/dL (10 mmol/L). A1C level: less than 7%. Supplies needed: Blood glucose meter. Test strips for your meter. Each meter has its own strips. You must use the strips that came with your meter. A needle to prick your finger (lancet). Do not use a lancet more than one time. A device that holds the lancet (lancing device). A journal or log book to write down your results. How to check your blood glucose Checking your blood glucose  Wash your hands for at least 20 seconds with soap and water. Prick the side of your finger (not the tip) with the lancet. Do not use the same finger consecutively. Gently rub the finger until a small drop of blood appears. Follow instructions that come with your meter for inserting the test strip, applying blood to the strip, and using your blood glucose meter. Write down your result and any notes in your  log. Using alternative sites Some meters allow you to use areas of your body other than your finger (alternative sites) to test your blood. The most common alternative sites are the forearm, the thigh, and the palm of your hand. Alternative sites may not be as accurate as the fingers because blood flow is slower in those areas. This means that the result you get may be delayed, and it may be different from the result that you would get from your finger. Use the finger only, and do not use alternative sites, if: You think you have hypoglycemia. You sometimes do not know that your blood glucose is getting low (hypoglycemia unawareness). General tips and recommendations Blood glucose log  Every time you check your blood glucose, write down your result. Also write down any notes about things that may be affecting your blood glucose, such as your diet and exercise for the day. This information can help you and your health care provider: Look for patterns in your blood glucose over time. Adjust your diabetes management plan as needed. Check if your meter allows you to download your records to a computer or if there is an app for the meter. Most glucose meters store a record of glucose readings in the meter. If you have type 1 diabetes: Check your blood glucose 4 or more times a day if you are on intensive insulin therapy with multiple daily injections (MDI) or if you are using an insulin pump. Check your blood glucose: Before every meal and snack. Before bedtime. Also check your blood glucose: If you have symptoms of hypoglycemia. After treating low blood glucose.  Before doing activities that create a risk for injury, like driving or using machinery. Before and after exercise. Two hours after a meal. Occasionally between 2:00 a.m. and 3:00 a.m., as directed. You may need to check your blood glucose more often, 6-10 times per day, if: You have diabetes that is not well controlled. You are  ill. You have a history of severe hypoglycemia. You have hypoglycemia unawareness. If you have type 2 diabetes: Check your blood glucose 2 or more times a day if you take insulin or other diabetes medicines. Check your blood glucose 4 or more times a day if you are on intensive insulin therapy. Occasionally, you may also need to check your glucose between 2:00 a.m. and 3:00 a.m., as directed. Also check your blood glucose: Before and after exercise. Before doing activities that create a risk for injury, like driving or using machinery. You may need to check your blood glucose more often if: Your medicine is being adjusted. Your diabetes is not well controlled. You are ill. General tips Make sure you always have your supplies with you. After you use a few boxes of test strips, adjust (calibrate) your blood glucose meter by following instructions that came with your meter. If you have questions or need help, all blood glucose meters have a 24-hour hotline phone number available that you can call. Also contact your health care provider with questions or concerns you may have. Where to find more information The American Diabetes Association: www.diabetes.org The Association of Diabetes Care & Education Specialists: www.diabeteseducator.org Contact a health care provider if: Your blood glucose is at or above 240 mg/dL (13.3 mmol/L) for 2 days in a row. You have been sick or have had a fever for 2 days or longer, and you are not getting better. You have any of the following problems for more than 6 hours: You cannot eat or drink. You have nausea or vomiting. You have diarrhea. Get help right away if: Your blood glucose is lower than 54 mg/dL (3 mmol/L). You become confused, or you have trouble thinking clearly. You have difficulty breathing. You have moderate or large ketone levels in your urine. These symptoms may represent a serious problem that is an emergency. Do not wait to see if the  symptoms will go away. Get medical help right away. Call your local emergency services (911 in the [place] Do not drive yourself to the hospital. Summary Monitoring your blood glucose is an important part of managing your diabetes. Blood glucose monitoring involves checking your blood glucose as often as directed and keeping a log or record of your results over time. Your health care provider will set individualized treatment goals for you. Your goals will be based on your age, other medical conditions you have, and how you respond to diabetes treatment. Every time you check your blood glucose, write down your result. Also, write down any notes about things that may be affecting your blood glucose, such as your diet and exercise for the day. This information is not intended to replace advice given to you by your health care provider. Make sure you discuss any questions you have with your health care provider. Document Revised: 04/05/2020 Document Reviewed: 04/05/2020 Elsevier Patient Education  [place] Eating Plan DASH stands for Dietary Approaches to Stop Hypertension. The DASH eating plan is a healthy eating plan that has been shown to: Reduce high blood pressure (hypertension). Reduce your risk for type 2 diabetes, heart disease, and stroke. Help with  weight loss. What are tips for following this plan? Reading food labels Check food labels for the amount of salt (sodium) per serving. Choose foods with less than 5 percent of the Daily Value of sodium. Generally, foods with less than 300 milligrams (mg) of sodium per serving fit into this eating plan. To find whole grains, look for the word "whole" as the first word in the ingredient list. Shopping Buy products labeled as "low-sodium" or "no salt added." Buy fresh foods. Avoid canned foods and pre-made or frozen meals. Cooking Avoid adding salt when cooking. Use salt-free seasonings or herbs instead of table salt or sea salt.  Check with your health care provider or pharmacist before using salt substitutes. Do not fry foods. Cook foods using healthy methods such as baking, boiling, grilling, roasting, and broiling instead. Cook with heart-healthy oils, such as olive, canola, avocado, soybean, or sunflower oil. Meal planning  Eat a balanced diet that includes: 4 or more servings of fruits and 4 or more servings of vegetables each day. Try to fill one-half of your plate with fruits and vegetables. 6-8 servings of whole grains each day. Less than 6 oz (170 g) of lean meat, poultry, or fish each day. A 3-oz (85-g) serving of meat is about the same size as a deck of cards. One egg equals 1 oz (28 g). 2-3 servings of low-fat dairy each day. One serving is 1 cup (237 mL). 1 serving of nuts, seeds, or beans 5 times each week. 2-3 servings of heart-healthy fats. Healthy fats called omega-3 fatty acids are found in foods such as walnuts, flaxseeds, fortified milks, and eggs. These fats are also found in cold-water fish, such as sardines, salmon, and mackerel. Limit how much you eat of: Canned or prepackaged foods. Food that is high in trans fat, such as some fried foods. Food that is high in saturated fat, such as fatty meat. Desserts and other sweets, sugary drinks, and other foods with added sugar. Full-fat dairy products. Do not salt foods before eating. Do not eat more than 4 egg yolks a week. Try to eat at least 2 vegetarian meals a week. Eat more home-cooked food and less restaurant, buffet, and fast food. Lifestyle When eating at a restaurant, ask that your food be prepared with less salt or no salt, if possible. If you drink alcohol: Limit how much you use to: 0-1 drink a day for women who are not pregnant. 0-2 drinks a day for men. Be aware of how much alcohol is in your drink. In the [place] one drink equals one 12 oz bottle of beer (355 mL), one 5 oz glass of wine (148 mL), or one 1 oz glass of hard liquor (44  mL). General information Avoid eating more than 2,300 mg of salt a day. If you have hypertension, you may need to reduce your sodium intake to 1,500 mg a day. Work with your health care provider to maintain a healthy body weight or to lose weight. Ask what an ideal weight is for you. Get at least 30 minutes of exercise that causes your heart to beat faster (aerobic exercise) most days of the week. Activities may include walking, swimming, or biking. Work with your health care provider or dietitian to adjust your eating plan to your individual calorie needs. What foods should I eat? Fruits All fresh, dried, or frozen fruit. Canned fruit in natural juice (without added sugar). Vegetables Fresh or frozen vegetables (raw, steamed, roasted, or grilled). Low-sodium or reduced-sodium  tomato and vegetable juice. Low-sodium or reduced-sodium tomato sauce and tomato paste. Low-sodium or reduced-sodium canned vegetables. Grains Whole-grain or whole-wheat bread. Whole-grain or whole-wheat pasta. Brown rice. [place]. Bulgur. Whole-grain and low-sodium cereals. Pita bread. Low-fat, low-sodium crackers. Whole-wheat flour tortillas. Meats and other proteins Skinless chicken or [place]. Ground chicken or [place]. Pork with fat trimmed off. Fish and seafood. Egg whites. Dried beans, peas, or lentils. Unsalted nuts, nut butters, and seeds. Unsalted canned beans. Lean cuts of beef with fat trimmed off. Low-sodium, lean precooked or cured meat, such as sausages or meat loaves. Dairy Low-fat (1%) or fat-free (skim) milk. Reduced-fat, low-fat, or fat-free cheeses. Nonfat, low-sodium ricotta or cottage cheese. Low-fat or nonfat yogurt. Low-fat, low-sodium cheese. Fats and oils Soft margarine without trans fats. Vegetable oil. Reduced-fat, low-fat, or light mayonnaise and salad dressings (reduced-sodium). Canola, safflower, olive, avocado, soybean, and sunflower oils. Avocado. Seasonings and condiments Herbs.  Spices. Seasoning mixes without salt. Other foods Unsalted popcorn and pretzels. Fat-free sweets. The items listed above may not be a complete list of foods and beverages you can eat. Contact a dietitian for more information. What foods should I avoid? Fruits Canned fruit in a light or heavy syrup. Fried fruit. Fruit in cream or butter sauce. Vegetables Creamed or fried vegetables. Vegetables in a cheese sauce. Regular canned vegetables (not low-sodium or reduced-sodium). Regular canned tomato sauce and paste (not low-sodium or reduced-sodium). Regular tomato and vegetable juice (not low-sodium or reduced-sodium). Angie Fava. Olives. Grains Baked goods made with fat, such as croissants, muffins, or some breads. Dry pasta or rice meal packs. Meats and other proteins Fatty cuts of meat. Ribs. Fried meat. Berniece Salines. Bologna, salami, and other precooked or cured meats, such as sausages or meat loaves. Fat from the back of a pig (fatback). Bratwurst. Salted nuts and seeds. Canned beans with added salt. Canned or smoked fish. Whole eggs or egg yolks. Chicken or [place] with skin. Dairy Whole or 2% milk, cream, and half-and-half. Whole or full-fat cream cheese. Whole-fat or sweetened yogurt. Full-fat cheese. Nondairy creamers. Whipped toppings. Processed cheese and cheese spreads. Fats and oils Butter. Stick margarine. Lard. Shortening. Ghee. Bacon fat. Tropical oils, such as coconut, palm kernel, or palm oil. Seasonings and condiments Onion salt, garlic salt, seasoned salt, table salt, and sea salt. Worcestershire sauce. Tartar sauce. Barbecue sauce. Teriyaki sauce. Soy sauce, including reduced-sodium. Steak sauce. Canned and packaged gravies. Fish sauce. Oyster sauce. Cocktail sauce. Store-bought horseradish. Ketchup. Mustard. Meat flavorings and tenderizers. Bouillon cubes. Hot sauces. Pre-made or packaged marinades. Pre-made or packaged taco seasonings. Relishes. Regular salad dressings. Other foods Salted  popcorn and pretzels. The items listed above may not be a complete list of foods and beverages you should avoid. Contact a dietitian for more information. Where to find more information National Heart, Lung, and Blood Institute: https://wilson-eaton.com/ American Heart Association: www.heart.org Academy of Nutrition and Dietetics: www.eatright.[place]: www.kidney.org Summary The DASH eating plan is a healthy eating plan that has been shown to reduce high blood pressure (hypertension). It may also reduce your risk for type 2 diabetes, heart disease, and stroke. When on the DASH eating plan, aim to eat more fresh fruits and vegetables, whole grains, lean proteins, low-fat dairy, and heart-healthy fats. With the DASH eating plan, you should limit salt (sodium) intake to 2,300 mg a day. If you have hypertension, you may need to reduce your sodium intake to 1,500 mg a day. Work with your health care provider or dietitian to adjust your eating  plan to your individual calorie needs. This information is not intended to replace advice given to you by your health care provider. Make sure you discuss any questions you have with your health care provider. Document Revised: 06/11/2019 Document Reviewed: 06/11/2019 Elsevier Patient Education  [place].

## 2022-04-18 NOTE — Progress Notes (Signed)
Established Patient Office Visit  Subjective   Patient ID: Alvin Warren, male    DOB: 12/18/1960  Age: 61 y.o. MRN: [id number]  Chief Complaint  Patient presents with   Hypertension    Hypertension   Alvin Warren is a 61 y/o male who has history of hypertension, alcohol and substance use disorder presents for routine follow up visit. He is compliant with his medications and reports no side effects. He currently resides at [facility] and plans to stay there for another 6 months for rehabilitation. He checks his blood pressure daily, his systolic is normally 314-970 mmHg and his diastolic is 26-37 mmHg. He checks his blood glucose about once a week and it is consistently less than 180 mg/dL. His POCT BG today in the clinic is 88 mg/dL and his HgbA1c is 5.8%. He is making healthy lifestyle changes and walks daily. Overall, he is doing well and offers no complaints.   Review of Systems  Constitutional: Negative.   HENT: Negative.    Eyes: Negative.   Respiratory:  Positive for cough (occasional dry cough).   Cardiovascular: Negative.   Gastrointestinal: Negative.   Genitourinary: Negative.   Musculoskeletal: Negative.   Neurological: Negative.   Endo/Heme/Allergies: Negative.   Psychiatric/Behavioral: Negative.        Objective:     BP (!) 148/81 (BP Location: Left Arm, Patient Position: Sitting, Cuff Size: Large)   Pulse 65   Temp 98.6 F (37 C) (Oral)   Ht '6\' 2"'$  (1.88 m)   Wt 220 lb 11.2 oz (100.1 kg)   SpO2 93%   BMI 28.34 kg/m  BP Readings from Last 3 Encounters:  04/18/22 (!) 148/81  03/18/22 130/78  02/13/22 (!) 142/79   Wt Readings from Last 3 Encounters:  04/18/22 220 lb 11.2 oz (100.1 kg)  03/18/22 210 lb (95.3 kg)  02/13/22 215 lb 8 oz (97.8 kg)    Encouraged weight loss  Physical Exam Constitutional:      Appearance: Normal appearance. He is normal weight.  HENT:     Head: Normocephalic and atraumatic.  Cardiovascular:     Rate and Rhythm: Normal rate  and regular rhythm.     Pulses: Normal pulses.     Heart sounds: Normal heart sounds.  Pulmonary:     Effort: Pulmonary effort is normal.     Breath sounds: Normal breath sounds.  Skin:    General: Skin is warm and dry.  Neurological:     General: No focal deficit present.     Mental Status: He is alert and oriented to person, place, and time. Mental status is at baseline.  Psychiatric:        Mood and Affect: Mood normal.        Behavior: Behavior normal.        Thought Content: Thought content normal.        Judgment: Judgment normal.      No results found for any visits on 04/18/22.  Last CBC Lab Results  Component Value Date   WBC 4.0 04/07/2020   HGB 13.7 04/07/2020   HCT 41.4 04/07/2020   MCV 88.1 04/07/2020   MCH 29.1 04/07/2020   RDW 15.7 (H) 04/07/2020   PLT 194 85/88/5027   Last metabolic panel Lab Results  Component Value Date   GLUCOSE 92 04/07/2020   NA 143 04/07/2020   K 3.9 04/07/2020   CL 106 04/07/2020   CO2 26 04/07/2020   BUN 5 (L) 04/07/2020   CREATININE 1.01  04/07/2020   GFRNONAA >60 04/07/2020   CALCIUM 8.7 (L) 04/07/2020   PROT 7.9 04/07/2020   ALBUMIN 3.9 04/07/2020   BILITOT 0.7 04/07/2020   ALKPHOS 47 04/07/2020   AST 37 04/07/2020   ALT 26 04/07/2020   ANIONGAP 11 04/07/2020   Last lipids Lab Results  Component Value Date   CHOL 147 01/16/2022   HDL 44 01/16/2022   LDLCALC 89 01/16/2022   TRIG 72 01/16/2022   CHOLHDL 3.3 01/16/2022   Last hemoglobin A1c Lab Results  Component Value Date   HGBA1C 6.3 (H) 01/16/2022   Last thyroid functions Lab Results  Component Value Date   TSH 0.604 01/20/2020      The 10-year ASCVD risk score (Arnett DK, et al., 2019) is: 43.5%    Assessment & Plan:   1. Prediabetes - His prediabetes is well-controlled. HgbA1c is 5.8% and POCT BG 88 mg/dL. Continue on metformin. Low carb diet and exercise as tolerated.  - POCT HgB A1C; Future - POCT Glucose (CBG); Future - Urine  Microalbumin w/creat. ratio; Future - metFORMIN (GLUCOPHAGE) 500 MG tablet; Take 1 tablet (500 mg total) by mouth 2 (two) times daily with a meal.  Dispense: 60 tablet; Refill: 2  2. Essential hypertension - His blood pressure is overall not well-controlled. Continue to check daily blood pressures and bring log to next visit. DASH diet and exercise as tolerated.   3. Nail fungus - He reports ongoing fungus under bilateral hallux toenails. There is no edema, erythema, or discharge. Referral to podiatry.  - Ambulatory referral to Podiatry   Follow-up in around 3 months, 07/04/22.    Rayvon Char

## 2022-04-24 ENCOUNTER — Other Ambulatory Visit: Payer: Self-pay

## 2022-04-24 DIAGNOSIS — I10 Essential (primary) hypertension: Secondary | ICD-10-CM

## 2022-04-24 DIAGNOSIS — R7303 Prediabetes: Secondary | ICD-10-CM

## 2022-04-24 NOTE — Addendum Note (Signed)
Addended by: Adah Perl on: 04/24/2022 03:39 PM   Modules accepted: Orders

## 2022-04-24 NOTE — Addendum Note (Signed)
Addended by: Adah Perl on: 04/24/2022 03:58 PM   Modules accepted: Orders

## 2022-04-25 LAB — MICROALBUMIN / CREATININE URINE RATIO
Creatinine, Urine: 248.9 mg/dL
Microalb/Creat Ratio: 3 mg/g creat (ref 0–29)
Microalbumin, Urine: 8.3 ug/mL

## 2022-04-25 LAB — LIPID PANEL
Chol/HDL Ratio: 3.5 ratio (ref 0.0–5.0)
Cholesterol, Total: 149 mg/dL (ref 100–199)
HDL: 43 mg/dL (ref >39)
LDL Chol Calc (NIH): 96 mg/dL (ref 0–99)
Triglycerides: 45 mg/dL (ref 0–149)
VLDL Cholesterol Cal: 10 mg/dL (ref 5–40)

## 2022-05-13 ENCOUNTER — Other Ambulatory Visit: Payer: Self-pay

## 2022-05-31 IMAGING — CT CT MAXILLOFACIAL W/O CM
3 series · 15 of 47 positions shown, 18 images · non-contrast
Comparison: None.

CLINICAL DATA: Facial trauma with eye region pain

EXAM:
CT MAXILLOFACIAL WITHOUT CONTRAST
TECHNIQUE: Multidetector CT imaging of the maxillofacial structures was
performed. Multiplanar CT image reconstructions were also generated.

[Series 3: facialbone 2.0 st · axial · 0.39mm/px · z∈[-238,-66]mm · 9 of 100 slices shown, 12 images]
[im 7/100  brain]
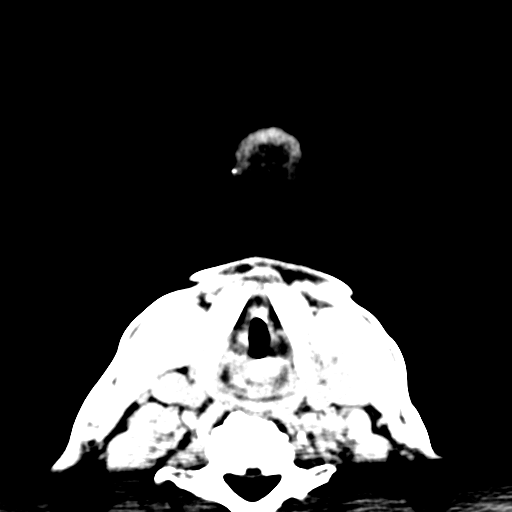
[im 7/100  bone]
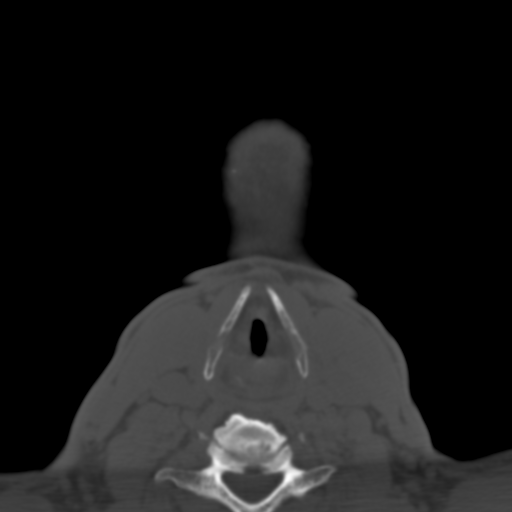
[im 18/100  bone]
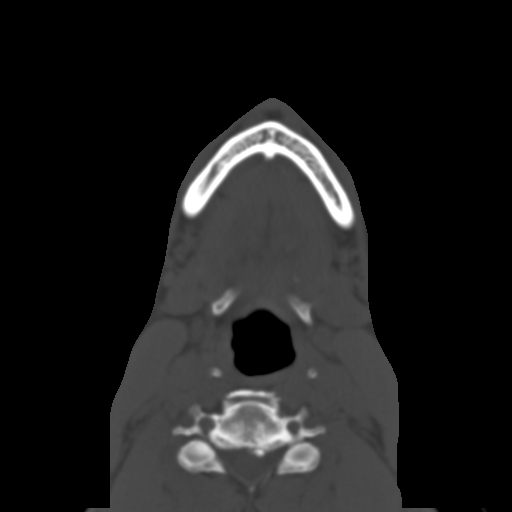
[im 28/100  bone]
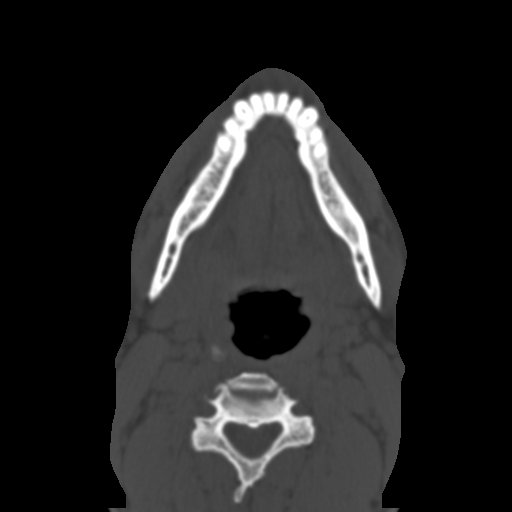
[im 38/100  bone]
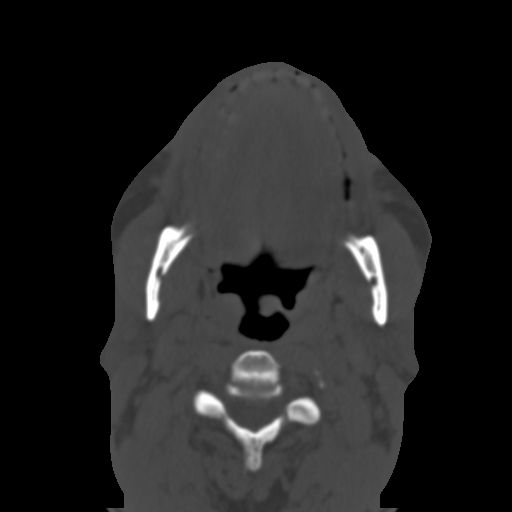
[im 52/100  brain]
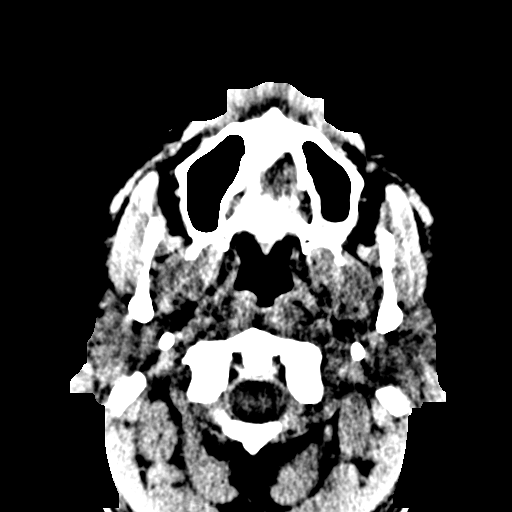
[im 52/100  bone]
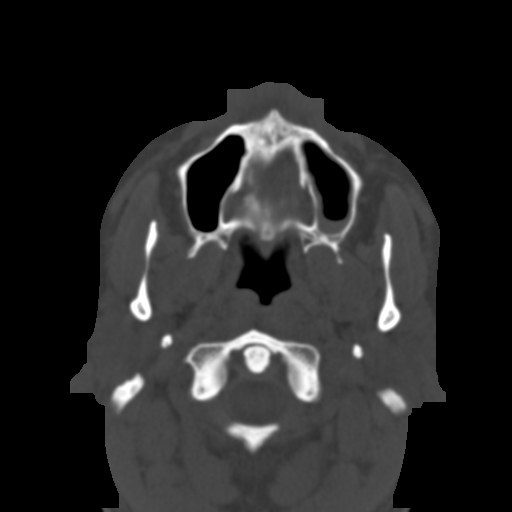
[im 62/100  bone]
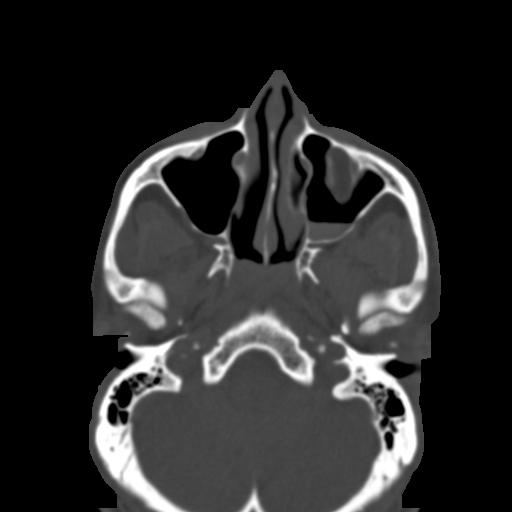
[im 72/100  bone]
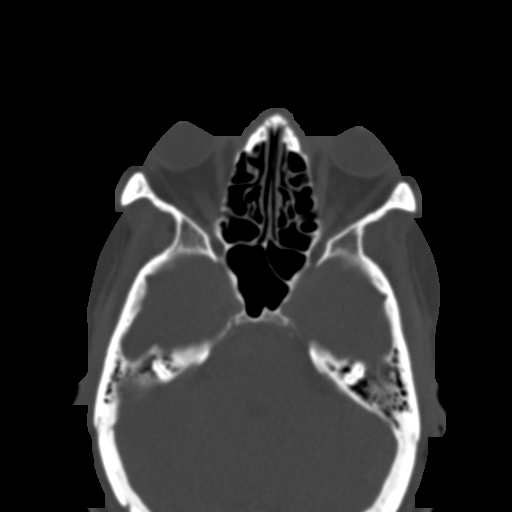
[im 82/100  bone]
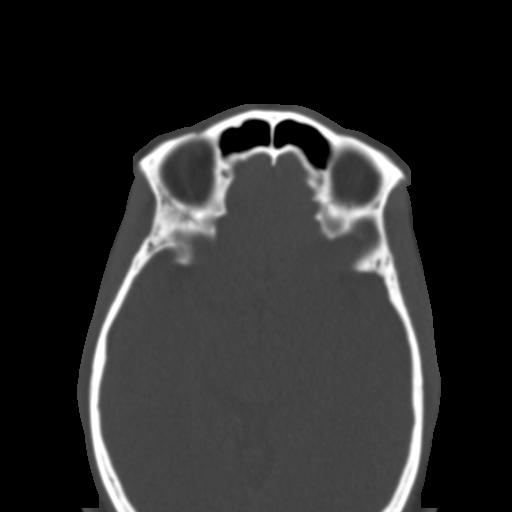
[im 93/100  brain]
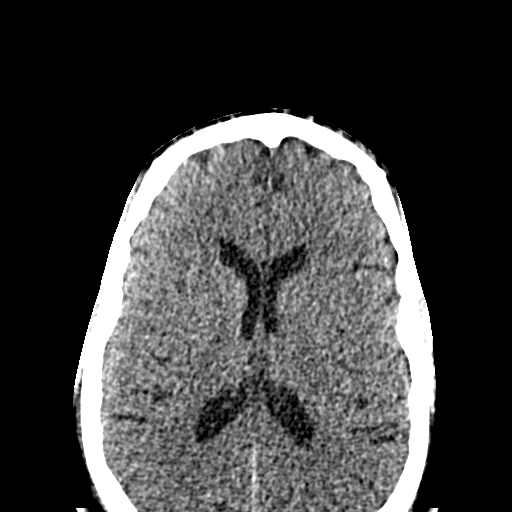
[im 93/100  bone]
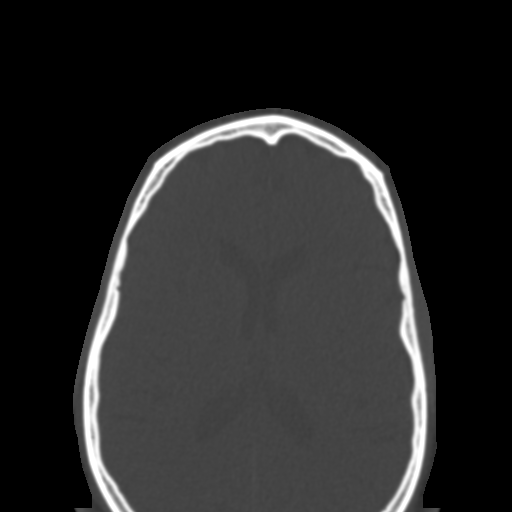

[Series 7: facialbone 2.0 cor st · coronal · 0.38mm/px · 3 of 88 slices shown]
[im 30/88  bone]
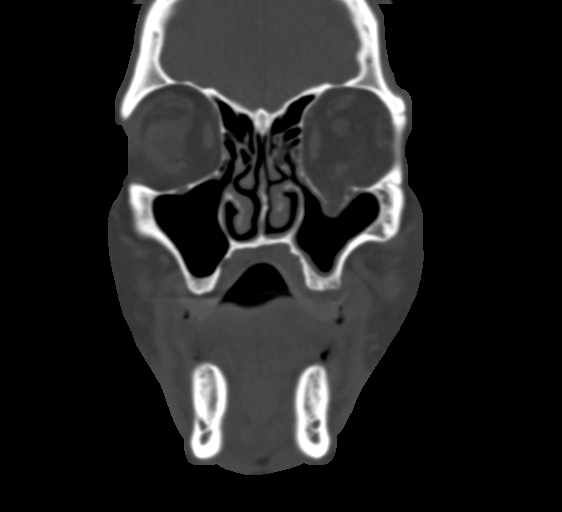
[im 39/88  bone]
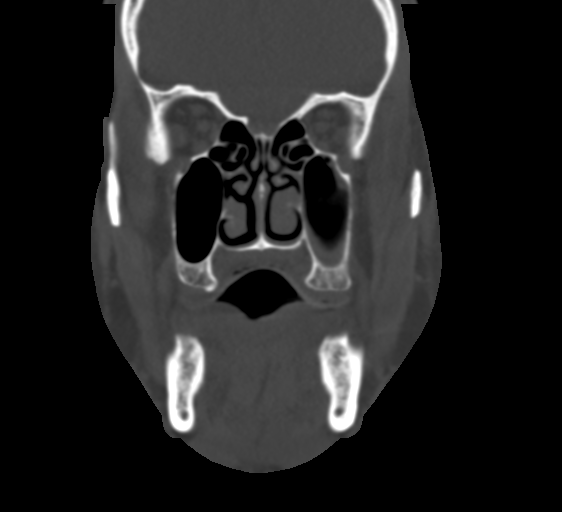
[im 49/88  bone]
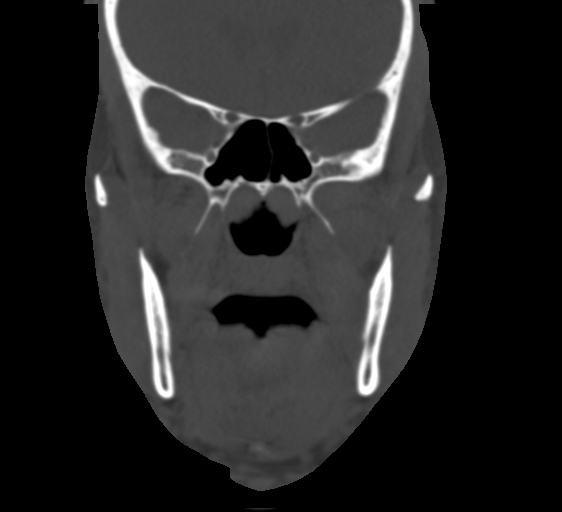

[Series 8: facialbone 2.0 sag st · sagittal · 0.38mm/px · 3 of 88 slices shown]
[im 30/88  bone]
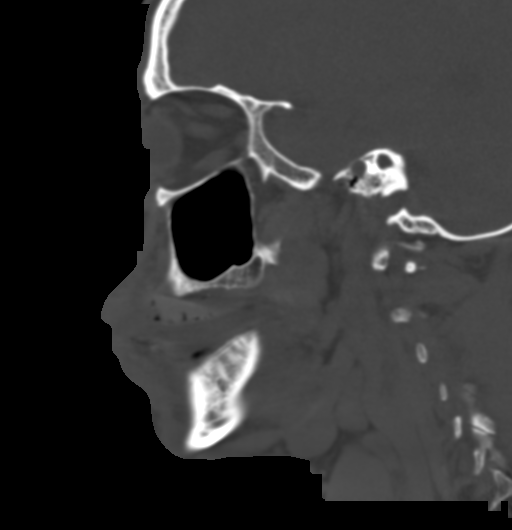
[im 44/88  bone]
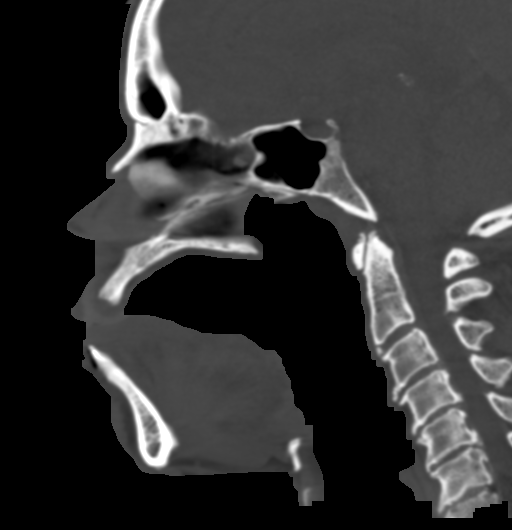
[im 59/88  bone]
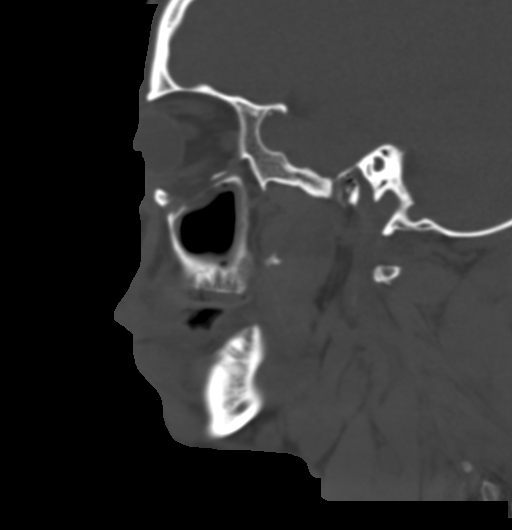

[15 of 47 positions shown; findings below may reference images not displayed]

FINDINGS: Osseous: There is a fracture of the left orbital floor. Fat
protrudes inferiorly through this area of fracture. There is no
associated intramuscular extension through this area of fracture.
There is mild soft tissue stranding in the fat in this area which
protrudes into the area of fracture, extending into the superior
left maxillary antrum. There is a nondisplaced fracture along the
anterior left maxillary antrum as well. No other fractures are
appreciable. No dislocation. No blastic or lytic bone lesions.

Orbits: No intraorbital lesions are evident beyond the fat
protrusion inferiorly due to the orbital floor fracture on the left.
Globes appear intact bilaterally. No intraorbital mass or hemorrhage
is appreciable.

Sinuses: There is opacification and mucosal thickening in the left
maxillary antrum with an air-fluid level in the left maxillary
antrum. A 4 mm retention cyst is seen in the anterior a medial right
maxillary antrum. There is opacification in anterior left ethmoid
air cell. There is mucosal thickening in several ethmoid air cells
bilaterally. Other paranasal sinuses are clear. No bony destruction
or expansion evident. Ostiomeatal unit complexes are patent
bilaterally. No nares obstruction.

Soft tissues: There is no appreciable soft tissue hematoma or
abscess. Salivary glands appear symmetric bilaterally. No
adenopathy. Tongue and tongue base regions appear normal. Visualized
pharynx appears normal. There are multiple foci of degenerative
change in the visualized cervical spine. No fracture or
spondylolisthesis is evident in the visualized cervical spine.

Limited intracranial: Visualized intracranial regions appear
unremarkable.
IMPRESSION: 1. Fracture of the left orbital floor with inferior proptosis of
fat. There is mild soft tissue stranding within this inferiorly
displaced fat. No intraorbital muscular proptosis; muscular
structures bilaterally appear symmetric and unremarkable
bilaterally.

2.  Nondisplaced fracture anterior left maxillary wall.

3. Areas of paranasal sinus disease including air-fluid level in the
left maxillary antrum.

## 2022-06-03 ENCOUNTER — Other Ambulatory Visit: Payer: Self-pay

## 2022-06-10 ENCOUNTER — Other Ambulatory Visit: Payer: Self-pay

## 2022-07-04 ENCOUNTER — Ambulatory Visit: Payer: Self-pay | Admitting: Gerontology

## 2022-07-04 ENCOUNTER — Other Ambulatory Visit: Payer: Self-pay

## 2022-07-04 DIAGNOSIS — I10 Essential (primary) hypertension: Secondary | ICD-10-CM

## 2022-07-04 DIAGNOSIS — R7303 Prediabetes: Secondary | ICD-10-CM

## 2022-07-04 DIAGNOSIS — Z0000 Encounter for general adult medical examination without abnormal findings: Secondary | ICD-10-CM

## 2022-07-04 MED ORDER — METFORMIN HCL 500 MG PO TABS
500.0000 mg | ORAL_TABLET | Freq: Two times a day (BID) | ORAL | 0 refills | Status: DC
  Filled 2022-07-04: qty 180, 90d supply, fill #0

## 2022-07-04 MED ORDER — AMLODIPINE BESYLATE 5 MG PO TABS
5.0000 mg | ORAL_TABLET | Freq: Every day | ORAL | 0 refills | Status: DC
  Filled 2022-07-04: qty 90, 90d supply, fill #0

## 2022-07-04 NOTE — Addendum Note (Signed)
Addended by: Adah Perl on: 07/04/2022 07:49 PM   Modules accepted: Orders

## 2022-07-04 NOTE — Patient Instructions (Signed)
Heart-Healthy Eating Plan Many factors influence your heart health, including eating and exercise habits. Heart health is also called coronary health. Coronary risk increases with abnormal blood fat (lipid) levels. A heart-healthy eating plan includes limiting unhealthy fats, increasing healthy fats, limiting salt (sodium) intake, and making other diet and lifestyle changes. What is my plan? Your health care provider may recommend that: You limit your fat intake to _________% or less of your total calories each day. You limit your saturated fat intake to _________% or less of your total calories each day. You limit the amount of cholesterol in your diet to less than _________ mg per day. You limit the amount of sodium in your diet to less than _________ mg per day. What are tips for following this plan? Cooking Cook foods using methods other than frying. Baking, boiling, grilling, and broiling are all good options. Other ways to reduce fat include: Removing the skin from poultry. Removing all visible fats from meats. Steaming vegetables in water or broth. Meal planning  At meals, imagine dividing your plate into fourths: Fill one-half of your plate with vegetables and green salads. Fill one-fourth of your plate with whole grains. Fill one-fourth of your plate with lean protein foods. Eat 2-4 cups of vegetables per day. One cup of vegetables equals 1 cup (91 g) broccoli or cauliflower florets, 2 medium carrots, 1 large bell pepper, 1 large sweet potato, 1 large tomato, 1 medium white potato, 2 cups (150 g) raw leafy greens. Eat 1-2 cups of fruit per day. One cup of fruit equals 1 small apple, 1 large banana, 1 cup (237 g) mixed fruit, 1 large orange,  cup (82 g) dried fruit, 1 cup (240 mL) 100% fruit juice. Eat more foods that contain soluble fiber. Examples include apples, broccoli, carrots, beans, peas, and barley. Aim to get 25-30 g of fiber per day. Increase your consumption of legumes,  nuts, and seeds to 4-5 servings per week. One serving of dried beans or legumes equals  cup (90 g) cooked, 1 serving of nuts is  oz (12 almonds, 24 pistachios, or 7 walnut halves), and 1 serving of seeds equals  oz (8 g). Fats Choose healthy fats more often. Choose monounsaturated and polyunsaturated fats, such as olive and canola oils, avocado oil, flaxseeds, walnuts, almonds, and seeds. Eat more omega-3 fats. Choose salmon, mackerel, sardines, tuna, flaxseed oil, and ground flaxseeds. Aim to eat fish at least 2 times each week. Check food labels carefully to identify foods with trans fats or high amounts of saturated fat. Limit saturated fats. These are found in animal products, such as meats, butter, and cream. Plant sources of saturated fats include palm oil, palm kernel oil, and coconut oil. Avoid foods with partially hydrogenated oils in them. These contain trans fats. Examples are stick margarine, some tub margarines, cookies, crackers, and other baked goods. Avoid fried foods. General information Eat more home-cooked food and less restaurant, buffet, and fast food. Limit or avoid alcohol. Limit foods that are high in added sugar and simple starches such as foods made using white refined flour (white breads, pastries, sweets). Lose weight if you are overweight. Losing just 5-10% of your body weight can help your overall health and prevent diseases such as diabetes and heart disease. Monitor your sodium intake, especially if you have high blood pressure. Talk with your health care provider about your sodium intake. Try to incorporate more vegetarian meals weekly. What foods should I eat? Fruits All fresh, canned (in natural   juice), or frozen fruits. Vegetables Fresh or frozen vegetables (raw, steamed, roasted, or grilled). Green salads. Grains Most grains. Choose whole wheat and whole grains most of the time. Rice and pasta, including brown rice and pastas made with whole wheat. Meats  and other proteins Lean, well-trimmed beef, veal, pork, and lamb. Chicken and [place] without skin. All fish and shellfish. Wild duck, rabbit, pheasant, and venison. Egg whites or low-cholesterol egg substitutes. Dried beans, peas, lentils, and tofu. Seeds and most nuts. Dairy Low-fat or nonfat cheeses, including ricotta and mozzarella. Skim or 1% milk (liquid, powdered, or evaporated). Buttermilk made with low-fat milk. Nonfat or low-fat yogurt. Fats and oils Non-hydrogenated (trans-free) margarines. Vegetable oils, including soybean, sesame, sunflower, olive, avocado, peanut, safflower, corn, canola, and cottonseed. Salad dressings or mayonnaise made with a vegetable oil. Beverages Water (mineral or sparkling). Coffee and tea. Unsweetened ice tea. Diet beverages. Sweets and desserts Sherbet, gelatin, and fruit ice. Small amounts of dark chocolate. Limit all sweets and desserts. Seasonings and condiments All seasonings and condiments. The items listed above may not be a complete list of foods and beverages you can eat. Contact a dietitian for more options. What foods should I avoid? Fruits Canned fruit in heavy syrup. Fruit in cream or butter sauce. Fried fruit. Limit coconut. Vegetables Vegetables cooked in cheese, cream, or butter sauce. Fried vegetables. Grains Breads made with saturated or trans fats, oils, or whole milk. Croissants. Sweet rolls. Donuts. High-fat crackers, such as cheese crackers and chips. Meats and other proteins Fatty meats, such as hot dogs, ribs, sausage, bacon, rib-eye roast or steak. High-fat deli meats, such as salami and bologna. Caviar. Domestic duck and goose. Organ meats, such as liver. Dairy Cream, sour cream, cream cheese, and creamed cottage cheese. Whole-milk cheeses. Whole or 2% milk (liquid, evaporated, or condensed). Whole buttermilk. Cream sauce or high-fat cheese sauce. Whole-milk yogurt. Fats and oils Meat fat, or shortening. Cocoa butter,  hydrogenated oils, palm oil, coconut oil, palm kernel oil. Solid fats and shortenings, including bacon fat, salt pork, lard, and butter. Nondairy cream substitutes. Salad dressings with cheese or sour cream. Beverages Regular sodas and any drinks with added sugar. Sweets and desserts Frosting. Pudding. Cookies. Cakes. Pies. Milk chocolate or white chocolate. Buttered syrups. Full-fat ice cream or ice cream drinks. The items listed above may not be a complete list of foods and beverages to avoid. Contact a dietitian for more information. Summary Heart-healthy meal planning includes limiting unhealthy fats, increasing healthy fats, limiting salt (sodium) intake and making other diet and lifestyle changes. Lose weight if you are overweight. Losing just 5-10% of your body weight can help your overall health and prevent diseases such as diabetes and heart disease. Focus on eating a balance of foods, including fruits and vegetables, low-fat or nonfat dairy, lean protein, nuts and legumes, whole grains, and heart-healthy oils and fats. This information is not intended to replace advice given to you by your health care provider. Make sure you discuss any questions you have with your health care provider. Document Revised: 08/13/2021 Document Reviewed: 08/13/2021 Elsevier Patient Education  [place] Eating Plan DASH stands for Dietary Approaches to Stop Hypertension. The DASH eating plan is a healthy eating plan that has been shown to: Reduce high blood pressure (hypertension). Reduce your risk for type 2 diabetes, heart disease, and stroke. Help with weight loss. What are tips for following this plan? Reading food labels Check food labels for the amount of salt (sodium) per serving. Choose  foods with less than 5 percent of the Daily Value of sodium. Generally, foods with less than 300 milligrams (mg) of sodium per serving fit into this eating plan. To find whole grains, look for the word  "whole" as the first word in the ingredient list. Shopping Buy products labeled as "low-sodium" or "no salt added." Buy fresh foods. Avoid canned foods and pre-made or frozen meals. Cooking Avoid adding salt when cooking. Use salt-free seasonings or herbs instead of table salt or sea salt. Check with your health care provider or pharmacist before using salt substitutes. Do not fry foods. Cook foods using healthy methods such as baking, boiling, grilling, roasting, and broiling instead. Cook with heart-healthy oils, such as olive, canola, avocado, soybean, or sunflower oil. Meal planning  Eat a balanced diet that includes: 4 or more servings of fruits and 4 or more servings of vegetables each day. Try to fill one-half of your plate with fruits and vegetables. 6-8 servings of whole grains each day. Less than 6 oz (170 g) of lean meat, poultry, or fish each day. A 3-oz (85-g) serving of meat is about the same size as a deck of cards. One egg equals 1 oz (28 g). 2-3 servings of low-fat dairy each day. One serving is 1 cup (237 mL). 1 serving of nuts, seeds, or beans 5 times each week. 2-3 servings of heart-healthy fats. Healthy fats called omega-3 fatty acids are found in foods such as walnuts, flaxseeds, fortified milks, and eggs. These fats are also found in cold-water fish, such as sardines, salmon, and mackerel. Limit how much you eat of: Canned or prepackaged foods. Food that is high in trans fat, such as some fried foods. Food that is high in saturated fat, such as fatty meat. Desserts and other sweets, sugary drinks, and other foods with added sugar. Full-fat dairy products. Do not salt foods before eating. Do not eat more than 4 egg yolks a week. Try to eat at least 2 vegetarian meals a week. Eat more home-cooked food and less restaurant, buffet, and fast food. Lifestyle When eating at a restaurant, ask that your food be prepared with less salt or no salt, if possible. If you drink  alcohol: Limit how much you use to: 0-1 drink a day for women who are not pregnant. 0-2 drinks a day for men. Be aware of how much alcohol is in your drink. In the [place] one drink equals one 12 oz bottle of beer (355 mL), one 5 oz glass of wine (148 mL), or one 1 oz glass of hard liquor (44 mL). General information Avoid eating more than 2,300 mg of salt a day. If you have hypertension, you may need to reduce your sodium intake to 1,500 mg a day. Work with your health care provider to maintain a healthy body weight or to lose weight. Ask what an ideal weight is for you. Get at least 30 minutes of exercise that causes your heart to beat faster (aerobic exercise) most days of the week. Activities may include walking, swimming, or biking. Work with your health care provider or dietitian to adjust your eating plan to your individual calorie needs. What foods should I eat? Fruits All fresh, dried, or frozen fruit. Canned fruit in natural juice (without added sugar). Vegetables Fresh or frozen vegetables (raw, steamed, roasted, or grilled). Low-sodium or reduced-sodium tomato and vegetable juice. Low-sodium or reduced-sodium tomato sauce and tomato paste. Low-sodium or reduced-sodium canned vegetables. Grains Whole-grain or whole-wheat bread. Whole-grain or  whole-wheat pasta. Brown rice. Alvin Warren. Bulgur. Whole-grain and low-sodium cereals. Pita bread. Low-fat, low-sodium crackers. Whole-wheat flour tortillas. Meats and other proteins Skinless chicken or [place]. Ground chicken or [place]. Pork with fat trimmed off. Fish and seafood. Egg whites. Dried beans, peas, or lentils. Unsalted nuts, nut butters, and seeds. Unsalted canned beans. Lean cuts of beef with fat trimmed off. Low-sodium, lean precooked or cured meat, such as sausages or meat loaves. Dairy Low-fat (1%) or fat-free (skim) milk. Reduced-fat, low-fat, or fat-free cheeses. Nonfat, low-sodium ricotta or cottage cheese. Low-fat or  nonfat yogurt. Low-fat, low-sodium cheese. Fats and oils Soft margarine without trans fats. Vegetable oil. Reduced-fat, low-fat, or light mayonnaise and salad dressings (reduced-sodium). Canola, safflower, olive, avocado, soybean, and sunflower oils. Avocado. Seasonings and condiments Herbs. Spices. Seasoning mixes without salt. Other foods Unsalted popcorn and pretzels. Fat-free sweets. The items listed above may not be a complete list of foods and beverages you can eat. Contact a dietitian for more information. What foods should I avoid? Fruits Canned fruit in a light or heavy syrup. Fried fruit. Fruit in cream or butter sauce. Vegetables Creamed or fried vegetables. Vegetables in a cheese sauce. Regular canned vegetables (not low-sodium or reduced-sodium). Regular canned tomato sauce and paste (not low-sodium or reduced-sodium). Regular tomato and vegetable juice (not low-sodium or reduced-sodium). Alvin Warren. Olives. Grains Baked goods made with fat, such as croissants, muffins, or some breads. Dry pasta or rice meal packs. Meats and other proteins Fatty cuts of meat. Ribs. Fried meat. Alvin Warren. Bologna, salami, and other precooked or cured meats, such as sausages or meat loaves. Fat from the back of a pig (fatback). Bratwurst. Salted nuts and seeds. Canned beans with added salt. Canned or smoked fish. Whole eggs or egg yolks. Chicken or [place] with skin. Dairy Whole or 2% milk, cream, and half-and-half. Whole or full-fat cream cheese. Whole-fat or sweetened yogurt. Full-fat cheese. Nondairy creamers. Whipped toppings. Processed cheese and cheese spreads. Fats and oils Butter. Stick margarine. Lard. Shortening. Ghee. Bacon fat. Tropical oils, such as coconut, palm kernel, or palm oil. Seasonings and condiments Onion salt, garlic salt, seasoned salt, table salt, and sea salt. Worcestershire sauce. Tartar sauce. Barbecue sauce. Teriyaki sauce. Soy sauce, including reduced-sodium. Steak sauce.  Canned and packaged gravies. Fish sauce. Oyster sauce. Cocktail sauce. Store-bought horseradish. Ketchup. Mustard. Meat flavorings and tenderizers. Bouillon cubes. Hot sauces. Pre-made or packaged marinades. Pre-made or packaged taco seasonings. Relishes. Regular salad dressings. Other foods Salted popcorn and pretzels. The items listed above may not be a complete list of foods and beverages you should avoid. Contact a dietitian for more information. Where to find more information National Heart, Lung, and Blood Institute: https://wilson-eaton.com/ American Heart Association: www.heart.org Academy of Nutrition and Dietetics: www.eatright.[place]: www.kidney.org Summary The DASH eating plan is a healthy eating plan that has been shown to reduce high blood pressure (hypertension). It may also reduce your risk for type 2 diabetes, heart disease, and stroke. When on the DASH eating plan, aim to eat more fresh fruits and vegetables, whole grains, lean proteins, low-fat dairy, and heart-healthy fats. With the DASH eating plan, you should limit salt (sodium) intake to 2,300 mg a day. If you have hypertension, you may need to reduce your sodium intake to 1,500 mg a day. Work with your health care provider or dietitian to adjust your eating plan to your individual calorie needs. This information is not intended to replace advice given to you by your health care provider. Make sure  you discuss any questions you have with your health care provider. Document Revised: 06/11/2019 Document Reviewed: 06/11/2019 Elsevier Patient Education  [place].

## 2022-07-04 NOTE — Progress Notes (Signed)
Established Patient Office Visit  Subjective   Patient ID: Alvin Warren, male    DOB: 1961/04/13  Age: 61 y.o. MRN: [id number]  No chief complaint on file.   HPI Alvin Warren is a 61 y/o male who has history of hypertension, alcohol and substance use disorder presents for routine follow up visit and medication refill. He states that he's compliant with his medication, denies side effects and continues to make healthy lifestyle changes. His HgbA1c done on 04/18/22 was 5.8%. He denies chest pain, palpitation, vision changes and peripheral edema. Overall, he states that he's doing well and offers no further complaint.   Review of Systems  Constitutional: Negative.   Eyes: Negative.   Respiratory: Negative.    Cardiovascular: Negative.   Skin: Negative.   Neurological: Negative.   Endo/Heme/Allergies: Negative.   Psychiatric/Behavioral: Negative.     .     Objective:     There were no vitals taken for this visit. BP Readings from Last 3 Encounters:  04/24/22 132/87  04/18/22 (!) 148/81  03/18/22 130/78   Wt Readings from Last 3 Encounters:  04/24/22 216 lb 4.8 oz (98.1 kg)  04/18/22 220 lb 11.2 oz (100.1 kg)  03/18/22 210 lb (95.3 kg)      Physical Exam HENT:     Head: Normocephalic and atraumatic.     Mouth/Throat:     Mouth: Mucous membranes are moist.  Eyes:     Extraocular Movements: Extraocular movements intact.     Conjunctiva/sclera: Conjunctivae normal.     Pupils: Pupils are equal, round, and reactive to light.  Cardiovascular:     Rate and Rhythm: Normal rate and regular rhythm.     Pulses: Normal pulses.     Heart sounds: Normal heart sounds.  Pulmonary:     Effort: Pulmonary effort is normal.     Breath sounds: Normal breath sounds.  Skin:    General: Skin is warm.  Neurological:     General: No focal deficit present.     Mental Status: He is alert and oriented to person, place, and time. Mental status is at baseline.  Psychiatric:        Mood  and Affect: Mood normal.        Behavior: Behavior normal.        Thought Content: Thought content normal.        Judgment: Judgment normal.      No results found for any visits on 07/04/22.  Last CBC Lab Results  Component Value Date   WBC 4.0 04/07/2020   HGB 13.7 04/07/2020   HCT 41.4 04/07/2020   MCV 88.1 04/07/2020   MCH 29.1 04/07/2020   RDW 15.7 (H) 04/07/2020   PLT 194 65/46/5035   Last metabolic panel Lab Results  Component Value Date   GLUCOSE 92 04/07/2020   NA 143 04/07/2020   K 3.9 04/07/2020   CL 106 04/07/2020   CO2 26 04/07/2020   BUN 5 (L) 04/07/2020   CREATININE 1.01 04/07/2020   GFRNONAA >60 04/07/2020   CALCIUM 8.7 (L) 04/07/2020   PROT 7.9 04/07/2020   ALBUMIN 3.9 04/07/2020   BILITOT 0.7 04/07/2020   ALKPHOS 47 04/07/2020   AST 37 04/07/2020   ALT 26 04/07/2020   ANIONGAP 11 04/07/2020   Last lipids Lab Results  Component Value Date   CHOL 149 04/24/2022   HDL 43 04/24/2022   LDLCALC 96 04/24/2022   TRIG 45 04/24/2022   CHOLHDL 3.5 04/24/2022   Last  hemoglobin A1c Lab Results  Component Value Date   HGBA1C 5.8 (A) 04/18/2022   Last thyroid functions Lab Results  Component Value Date   TSH 0.604 01/20/2020   Last vitamin D No results found for: "25OHVITD2", "25OHVITD3", "VD25OH"    The 10-year ASCVD risk score (Arnett DK, et al., 2019) is: 37.1%    Assessment & Plan:    1. Essential hypertension - His blood pressure is under control, he will continue current medication, DASH diet and exercise as tolerated. - amLODipine (NORVASC) 5 MG tablet; Take 1 tablet (5 mg total) by mouth daily.  Dispense: 90 tablet; Refill: 0  2. Prediabetes - He will continue current medication, low carb/non concentrated diet and exercise as tolerated. - metFORMIN (GLUCOPHAGE) 500 MG tablet; Take 1 tablet (500 mg total) by mouth 2 (two) times daily with a meal.  Dispense: 180 tablet; Refill: 0 - HgB A1c; Future  3. Health care maintenance -  Routine labs will be checked. - Lipid panel; Future - Comp Met (CMET); Future - CBC w/Diff; Future   Return in about 3 months (around 10/15/2022), or if symptoms worsen or fail to improve.    Kenlei Safi Jerold Coombe, NP

## 2022-07-04 NOTE — Addendum Note (Signed)
Addended by: Meda Klinefelter on: 07/04/2022 07:48 PM   Modules accepted: Orders

## 2022-07-05 LAB — URINALYSIS, ROUTINE W REFLEX MICROSCOPIC
Bilirubin, UA: NEGATIVE
Glucose, UA: NEGATIVE
Ketones, UA: NEGATIVE
Leukocytes,UA: NEGATIVE
Nitrite, UA: NEGATIVE
Protein,UA: NEGATIVE
RBC, UA: NEGATIVE
Specific Gravity, UA: 1.007 (ref 1.005–1.030)
Urobilinogen, Ur: 0.2 mg/dL (ref 0.2–1.0)
pH, UA: 7.5 (ref 5.0–7.5)

## 2022-07-05 LAB — MICROALBUMIN / CREATININE URINE RATIO
Creatinine, Urine: 42.3 mg/dL
Microalb/Creat Ratio: <7 mg/g creat (ref 0–29)
Microalbumin, Urine: <3 ug/mL

## 2022-07-29 ENCOUNTER — Other Ambulatory Visit: Payer: Self-pay

## 2022-10-03 ENCOUNTER — Other Ambulatory Visit: Payer: Self-pay

## 2022-10-03 DIAGNOSIS — R7303 Prediabetes: Secondary | ICD-10-CM

## 2022-10-03 DIAGNOSIS — Z0000 Encounter for general adult medical examination without abnormal findings: Secondary | ICD-10-CM

## 2022-10-04 LAB — COMPREHENSIVE METABOLIC PANEL
ALT: 13 IU/L (ref 0–44)
AST: 18 IU/L (ref 0–40)
Albumin/Globulin Ratio: 1.5 (ref 1.2–2.2)
Albumin: 4 g/dL (ref 3.8–4.9)
Alkaline Phosphatase: 72 IU/L (ref 44–121)
BUN/Creatinine Ratio: 10 (ref 10–24)
BUN: 12 mg/dL (ref 8–27)
Bilirubin Total: 0.2 mg/dL (ref 0.0–1.2)
CO2: 23 mmol/L (ref 20–29)
Calcium: 8.9 mg/dL (ref 8.6–10.2)
Chloride: 105 mmol/L (ref 96–106)
Creatinine, Ser: 1.21 mg/dL (ref 0.76–1.27)
Globulin, Total: 2.7 g/dL (ref 1.5–4.5)
Glucose: 103 mg/dL — ABNORMAL HIGH (ref 70–99)
Potassium: 4.2 mmol/L (ref 3.5–5.2)
Sodium: 140 mmol/L (ref 134–144)
Total Protein: 6.7 g/dL (ref 6.0–8.5)
eGFR: 69 mL/min/{1.73_m2} (ref >59)

## 2022-10-04 LAB — CBC WITH DIFFERENTIAL/PLATELET
Basophils Absolute: 0 10*3/uL (ref 0.0–0.2)
Basos: 1 %
EOS (ABSOLUTE): 0.1 10*3/uL (ref 0.0–0.4)
Eos: 3 %
Hematocrit: 37.5 % (ref 37.5–51.0)
Hemoglobin: 12.3 g/dL — ABNORMAL LOW (ref 13.0–17.7)
Immature Grans (Abs): 0 10*3/uL (ref 0.0–0.1)
Immature Granulocytes: 0 %
Lymphocytes Absolute: 2.8 10*3/uL (ref 0.7–3.1)
Lymphs: 55 %
MCH: 28.5 pg (ref 26.6–33.0)
MCHC: 32.8 g/dL (ref 31.5–35.7)
MCV: 87 fL (ref 79–97)
Monocytes Absolute: 0.6 10*3/uL (ref 0.1–0.9)
Monocytes: 12 %
Neutrophils Absolute: 1.5 10*3/uL (ref 1.4–7.0)
Neutrophils: 29 %
Platelets: 238 10*3/uL (ref 150–450)
RBC: 4.32 x10E6/uL (ref 4.14–5.80)
RDW: 12.6 % (ref 11.6–15.4)
WBC: 5.1 10*3/uL (ref 3.4–10.8)

## 2022-10-04 LAB — HEMOGLOBIN A1C
Est. average glucose Bld gHb Est-mCnc: 131 mg/dL
Hgb A1c MFr Bld: 6.2 % — ABNORMAL HIGH (ref 4.8–5.6)

## 2022-10-04 LAB — LIPID PANEL
Chol/HDL Ratio: 3.5 ratio (ref 0.0–5.0)
Cholesterol, Total: 162 mg/dL (ref 100–199)
HDL: 46 mg/dL (ref >39)
LDL Chol Calc (NIH): 105 mg/dL — ABNORMAL HIGH (ref 0–99)
Triglycerides: 54 mg/dL (ref 0–149)
VLDL Cholesterol Cal: 11 mg/dL (ref 5–40)

## 2022-10-10 ENCOUNTER — Other Ambulatory Visit: Payer: Self-pay

## 2022-10-17 ENCOUNTER — Ambulatory Visit: Payer: Self-pay

## 2022-11-06 ENCOUNTER — Telehealth: Payer: Self-pay

## 2022-11-06 NOTE — Telephone Encounter (Signed)
Called pt to schedule appt per [facility] note. Appt scheduled. Pt verbalized understanding of appt date and time.

## 2022-11-14 ENCOUNTER — Ambulatory Visit: Payer: Self-pay

## 2022-11-19 ENCOUNTER — Other Ambulatory Visit: Payer: Self-pay

## 2022-11-19 ENCOUNTER — Telehealth: Payer: Self-pay | Admitting: Emergency Medicine

## 2022-11-19 ENCOUNTER — Other Ambulatory Visit: Payer: Self-pay | Admitting: Gerontology

## 2022-11-19 DIAGNOSIS — I10 Essential (primary) hypertension: Secondary | ICD-10-CM

## 2022-11-19 NOTE — Telephone Encounter (Signed)
Patient needs OV. OV scheduled for 11/21/22.

## 2022-11-19 NOTE — Telephone Encounter (Signed)
Patient requesting refill on Amlodipine. Patient now showed the last 2 appointments. Called patient and advised he needs OV prior to additional refills. Scheduled OV for 11/21/22 @ 6:00pm. Patient states he has 4-5 tablets left. Advised will refill at OV on 11/21/22. Patient agreed and voiced understanding.

## 2022-11-21 ENCOUNTER — Ambulatory Visit: Payer: Self-pay | Admitting: Gerontology

## 2022-11-21 VITALS — BP 146/88 | HR 65 | Temp 97.9°F | Ht 74.0 in | Wt 208.0 lb

## 2022-11-21 DIAGNOSIS — E785 Hyperlipidemia, unspecified: Secondary | ICD-10-CM | POA: Insufficient documentation

## 2022-11-21 DIAGNOSIS — R7303 Prediabetes: Secondary | ICD-10-CM

## 2022-11-21 DIAGNOSIS — I10 Essential (primary) hypertension: Secondary | ICD-10-CM

## 2022-11-21 MED ORDER — AMLODIPINE BESYLATE 5 MG PO TABS
5.0000 mg | ORAL_TABLET | Freq: Every day | ORAL | 0 refills | Status: AC
Start: 2022-11-21 — End: ?
  Filled 2022-11-21: qty 90, 90d supply, fill #0

## 2022-11-21 MED ORDER — ATORVASTATIN CALCIUM 10 MG PO TABS
10.0000 mg | ORAL_TABLET | Freq: Every day | ORAL | 0 refills | Status: AC
Start: 2022-11-21 — End: ?
  Filled 2022-11-21: qty 30, 30d supply, fill #0

## 2022-11-21 MED ORDER — METFORMIN HCL 500 MG PO TABS
500.0000 mg | ORAL_TABLET | Freq: Two times a day (BID) | ORAL | 2 refills | Status: AC
Start: 2022-11-21 — End: ?
  Filled 2022-11-21: qty 60, 30d supply, fill #0

## 2022-11-21 NOTE — Progress Notes (Signed)
Established Patient Office Visit  Subjective   Patient ID: Alvin Warren, male    DOB: 07/05/1961  Age: 62 y.o. MRN: [id number]  Chief Complaint  Patient presents with   Follow-up    HPI  Alvin Warren is a 62 y/o male who has history of hypertension, alcohol and substance use disorder presents for routine follow up visit , lab review and medication refill . His labs done on 10/03/22 was unremarkable but his HgbA1c increased from 5.8% to 6.2%. He states that he's compliant with his medications, denies side effetcs and continues to make healthy lifestyle changes. He denies chest pain, palpitation, headache and vision changes. Overall, he states that he's doing well and offers no further complaint.  Review of Systems  Constitutional: Negative.   Eyes: Negative.   Respiratory: Negative.    Cardiovascular: Negative.   Neurological: Negative.   Endo/Heme/Allergies: Negative.   Psychiatric/Behavioral: Negative.        Objective:     BP (!) 146/88 (BP Location: Left Arm, Patient Position: Sitting, Cuff Size: Normal)   Pulse 65   Temp 97.9 F (36.6 C)   Ht 6\' 2"  (1.88 m)   Wt 208 lb (94.3 kg)   SpO2 98%   BMI 26.71 kg/m  BP Readings from Last 3 Encounters:  11/21/22 (!) 146/88  04/24/22 132/87  04/18/22 (!) 148/81   Wt Readings from Last 3 Encounters:  11/21/22 208 lb (94.3 kg)  04/24/22 216 lb 4.8 oz (98.1 kg)  04/18/22 220 lb 11.2 oz (100.1 kg)      Physical Exam HENT:     Nose: Nose normal.     Mouth/Throat:     Mouth: Mucous membranes are moist.  Eyes:     Extraocular Movements: Extraocular movements intact.     Conjunctiva/sclera: Conjunctivae normal.     Pupils: Pupils are equal, round, and reactive to light.  Cardiovascular:     Rate and Rhythm: Normal rate and regular rhythm.     Pulses: Normal pulses.     Heart sounds: Normal heart sounds.  Pulmonary:     Effort: Pulmonary effort is normal.     Breath sounds: Normal breath sounds.  Skin:    General:  Skin is warm.  Neurological:     General: No focal deficit present.     Mental Status: He is alert and oriented to person, place, and time. Mental status is at baseline.  Psychiatric:        Mood and Affect: Mood normal.        Behavior: Behavior normal.        Thought Content: Thought content normal.        Judgment: Judgment normal.      No results found for any visits on 11/21/22.  Last CBC Lab Results  Component Value Date   WBC 5.1 10/03/2022   HGB 12.3 (L) 10/03/2022   HCT 37.5 10/03/2022   MCV 87 10/03/2022   MCH 28.5 10/03/2022   RDW 12.6 10/03/2022   PLT 238 10/03/2022   Last metabolic panel Lab Results  Component Value Date   GLUCOSE 103 (H) 10/03/2022   NA 140 10/03/2022   K 4.2 10/03/2022   CL 105 10/03/2022   CO2 23 10/03/2022   BUN 12 10/03/2022   CREATININE 1.21 10/03/2022   EGFR 69 10/03/2022   CALCIUM 8.9 10/03/2022   PROT 6.7 10/03/2022   ALBUMIN 4.0 10/03/2022   LABGLOB 2.7 10/03/2022   AGRATIO 1.5 10/03/2022   BILITOT 0.2  10/03/2022   ALKPHOS 72 10/03/2022   AST 18 10/03/2022   ALT 13 10/03/2022   ANIONGAP 11 04/07/2020   Last lipids Lab Results  Component Value Date   CHOL 162 10/03/2022   HDL 46 10/03/2022   LDLCALC 105 (H) 10/03/2022   TRIG 54 10/03/2022   CHOLHDL 3.5 10/03/2022   Last hemoglobin A1c Lab Results  Component Value Date   HGBA1C 6.2 (H) 10/03/2022   Last thyroid functions Lab Results  Component Value Date   TSH 0.604 01/20/2020      The 10-year ASCVD risk score (Arnett DK, et al., 2019) is: 44.5%    Assessment & Plan:   1. Essential hypertension - His blood pressure is improving, will continue on current medication, DASH diet and exercise as tolerated. - amLODipine (NORVASC) 5 MG tablet; Take 1 tablet (5 mg total) by mouth daily.  Dispense: 90 tablet; Refill: 0  2. Prediabetes - His HgbA1c was 6.2%, he will continue current medication, low carb/non concentrated sweet diet and exercise as tolerated. -  metFORMIN (GLUCOPHAGE) 500 MG tablet; Take 1 tablet (500 mg total) by mouth 2 (two) times daily with a meal.  Dispense: 60 tablet; Refill: 2  3. Elevated lipids -The 10-year ASCVD risk score (Arnett DK, et al., 2019) is: 44.5%   Values used to calculate the score:     Age: 27 years     Sex: Male     Is Non-Hispanic African American: Yes     Diabetic: Yes     Tobacco smoker: Yes     Systolic Blood Pressure: 146 mmHg     Is BP treated: Yes     HDL Cholesterol: 46 mg/dL     Total Cholesterol: 162 mg/dL His ASCVD risk was 16.1%, he was started on 10 mg Atorvastatin, educated on medication side effects and advised to notify clinic. He was encouraged to continue on low fat/cholesterol diet and exercise as tolerated. - atorvastatin (LIPITOR) 10 MG tablet; Take 1 tablet (10 mg total) by mouth daily.  Dispense: 30 tablet; Refill: 0    Return in about 4 weeks (around 12/19/2022), or if symptoms worsen or fail to improve.    Etosha Wetherell Trellis Paganini, NP

## 2022-11-21 NOTE — Patient Instructions (Addendum)
Heart-Healthy Eating Plan Many factors influence your heart health, including eating and exercise habits. Heart health is also called coronary health. Coronary risk increases with abnormal blood fat (lipid) levels. A heart-healthy eating plan includes limiting unhealthy fats, increasing healthy fats, limiting salt (sodium) intake, and making other diet and lifestyle changes. What is my plan? Your health care provider may recommend that: You limit your fat intake to _________% or less of your total calories each day. You limit your saturated fat intake to _________% or less of your total calories each day. You limit the amount of cholesterol in your diet to less than _________ mg per day. You limit the amount of sodium in your diet to less than _________ mg per day. What are tips for following this plan? Cooking Cook foods using methods other than frying. Baking, boiling, grilling, and broiling are all good options. Other ways to reduce fat include: Removing the skin from poultry. Removing all visible fats from meats. Steaming vegetables in water or broth. Meal planning  At meals, imagine dividing your plate into fourths: Fill one-half of your plate with vegetables and green salads. Fill one-fourth of your plate with whole grains. Fill one-fourth of your plate with lean protein foods. Eat 2-4 cups of vegetables per day. One cup of vegetables equals 1 cup (91 g) broccoli or cauliflower florets, 2 medium carrots, 1 large bell pepper, 1 large sweet potato, 1 large tomato, 1 medium white potato, 2 cups (150 g) raw leafy greens. Eat 1-2 cups of fruit per day. One cup of fruit equals 1 small apple, 1 large banana, 1 cup (237 g) mixed fruit, 1 large orange,  cup (82 g) dried fruit, 1 cup (240 mL) 100% fruit juice. Eat more foods that contain soluble fiber. Examples include apples, broccoli, carrots, beans, peas, and barley. Aim to get 25-30 g of fiber per day. Increase your consumption of legumes,  nuts, and seeds to 4-5 servings per week. One serving of dried beans or legumes equals  cup (90 g) cooked, 1 serving of nuts is  oz (12 almonds, 24 pistachios, or 7 walnut halves), and 1 serving of seeds equals  oz (8 g). Fats Choose healthy fats more often. Choose monounsaturated and polyunsaturated fats, such as olive and canola oils, avocado oil, flaxseeds, walnuts, almonds, and seeds. Eat more omega-3 fats. Choose salmon, mackerel, sardines, tuna, flaxseed oil, and ground flaxseeds. Aim to eat fish at least 2 times each week. Check food labels carefully to identify foods with trans fats or high amounts of saturated fat. Limit saturated fats. These are found in animal products, such as meats, butter, and cream. Plant sources of saturated fats include palm oil, palm kernel oil, and coconut oil. Avoid foods with partially hydrogenated oils in them. These contain trans fats. Examples are stick margarine, some tub margarines, cookies, crackers, and other baked goods. Avoid fried foods. General information Eat more home-cooked food and less restaurant, buffet, and fast food. Limit or avoid alcohol. Limit foods that are high in added sugar and simple starches such as foods made using white refined flour (white breads, pastries, sweets). Lose weight if you are overweight. Losing just 5-10% of your body weight can help your overall health and prevent diseases such as diabetes and heart disease. Monitor your sodium intake, especially if you have high blood pressure. Talk with your health care provider about your sodium intake. Try to incorporate more vegetarian meals weekly. What foods should I eat? Fruits All fresh, canned (in natural   juice), or frozen fruits. Vegetables Fresh or frozen vegetables (raw, steamed, roasted, or grilled). Green salads. Grains Most grains. Choose whole wheat and whole grains most of the time. Rice and pasta, including brown rice and pastas made with whole wheat. Meats  and other proteins Lean, well-trimmed beef, veal, pork, and lamb. Chicken and turkey without skin. All fish and shellfish. Wild duck, rabbit, pheasant, and venison. Egg whites or low-cholesterol egg substitutes. Dried beans, peas, lentils, and tofu. Seeds and most nuts. Dairy Low-fat or nonfat cheeses, including ricotta and mozzarella. Skim or 1% milk (liquid, powdered, or evaporated). Buttermilk made with low-fat milk. Nonfat or low-fat yogurt. Fats and oils Non-hydrogenated (trans-free) margarines. Vegetable oils, including soybean, sesame, sunflower, olive, avocado, peanut, safflower, corn, canola, and cottonseed. Salad dressings or mayonnaise made with a vegetable oil. Beverages Water (mineral or sparkling). Coffee and tea. Unsweetened ice tea. Diet beverages. Sweets and desserts Sherbet, gelatin, and fruit ice. Small amounts of dark chocolate. Limit all sweets and desserts. Seasonings and condiments All seasonings and condiments. The items listed above may not be a complete list of foods and beverages you can eat. Contact a dietitian for more options. What foods should I avoid? Fruits Canned fruit in heavy syrup. Fruit in cream or butter sauce. Fried fruit. Limit coconut. Vegetables Vegetables cooked in cheese, cream, or butter sauce. Fried vegetables. Grains Breads made with saturated or trans fats, oils, or whole milk. Croissants. Sweet rolls. Donuts. High-fat crackers, such as cheese crackers and chips. Meats and other proteins Fatty meats, such as hot dogs, ribs, sausage, bacon, rib-eye roast or steak. High-fat deli meats, such as salami and bologna. Caviar. Domestic duck and goose. Organ meats, such as liver. Dairy Cream, sour cream, cream cheese, and creamed cottage cheese. Whole-milk cheeses. Whole or 2% milk (liquid, evaporated, or condensed). Whole buttermilk. Cream sauce or high-fat cheese sauce. Whole-milk yogurt. Fats and oils Meat fat, or shortening. Cocoa butter,  hydrogenated oils, palm oil, coconut oil, palm kernel oil. Solid fats and shortenings, including bacon fat, salt pork, lard, and butter. Nondairy cream substitutes. Salad dressings with cheese or sour cream. Beverages Regular sodas and any drinks with added sugar. Sweets and desserts Frosting. Pudding. Cookies. Cakes. Pies. Milk chocolate or white chocolate. Buttered syrups. Full-fat ice cream or ice cream drinks. The items listed above may not be a complete list of foods and beverages to avoid. Contact a dietitian for more information. Summary Heart-healthy meal planning includes limiting unhealthy fats, increasing healthy fats, limiting salt (sodium) intake and making other diet and lifestyle changes. Lose weight if you are overweight. Losing just 5-10% of your body weight can help your overall health and prevent diseases such as diabetes and heart disease. Focus on eating a balance of foods, including fruits and vegetables, low-fat or nonfat dairy, lean protein, nuts and legumes, whole grains, and heart-healthy oils and fats. This information is not intended to replace advice given to you by your health care provider. Make sure you discuss any questions you have with your health care provider. Document Revised: 08/13/2021 Document Reviewed: 08/13/2021 Elsevier Patient Education  2023 [employer] Carbohydrate Counting for Diabetes Mellitus, Adult Carbohydrate counting is a method of keeping track of how many carbohydrates you eat. Eating carbohydrates increases the amount of sugar (glucose) in the blood. Counting how many carbohydrates you eat improves how well you manage your blood glucose. This, in turn, helps you manage your diabetes. Carbohydrates are measured in grams (g) per serving. It is important to know how   many carbohydrates (in grams or by serving size) you can have in each meal. This is different for every person. A dietitian can help you make a meal plan and calculate how many  carbohydrates you should have at each meal and snack. What foods contain carbohydrates? Carbohydrates are found in the following foods: Grains, such as breads and cereals. Dried beans and soy products. Starchy vegetables, such as potatoes, peas, and corn. Fruit and fruit juices. Milk and yogurt. Sweets and snack foods, such as cake, cookies, candy, chips, and soft drinks. How do I count carbohydrates in foods? There are two ways to count carbohydrates in food. You can read food labels or learn standard serving sizes of foods. You can use either of these methods or a combination of both. Using the Nutrition Facts label The Nutrition Facts list is included on the labels of almost all packaged foods and beverages in the [place]. It includes: The serving size. Information about nutrients in each serving, including the grams of carbohydrate per serving. To use the Nutrition Facts, decide how many servings you will have. Then, multiply the number of servings by the number of carbohydrates per serving. The resulting number is the total grams of carbohydrates that you will be having. Learning the standard serving sizes of foods When you eat carbohydrate foods that are not packaged or do not include Nutrition Facts on the label, you need to measure the servings in order to count the grams of carbohydrates. Measure the foods that you will eat with a food scale or measuring cup, if needed. Decide how many standard-size servings you will eat. Multiply the number of servings by 15. For foods that contain carbohydrates, one serving equals 15 g of carbohydrates. For example, if you eat 2 cups or 10 oz (300 g) of strawberries, you will have eaten 2 servings and 30 g of carbohydrates (2 servings x 15 g = 30 g). For foods that have more than one food mixed, such as soups and casseroles, you must count the carbohydrates in each food that is included. The following list contains standard serving sizes of  common carbohydrate-rich foods. Each of these servings has about 15 g of carbohydrates: 1 slice of bread. 1 six-inch (15 cm) tortilla. ? cup or 2 oz (53 g) cooked rice or pasta.  cup or 3 oz (85 g) cooked or canned, drained and rinsed beans or lentils.  cup or 3 oz (85 g) starchy vegetable, such as peas, corn, or squash.  cup or 4 oz (120 g) hot cereal.  cup or 3 oz (85 g) boiled or mashed potatoes, or  or 3 oz (85 g) of a large baked potato.  cup or 4 fl oz (118 mL) fruit juice. 1 cup or 8 fl oz (237 mL) milk. 1 small or 4 oz (106 g) apple.  or 2 oz (63 g) of a medium banana. 1 cup or 5 oz (150 g) strawberries. 3 cups or 1 oz (28.3 g) popped popcorn. What is an example of carbohydrate counting? To calculate the grams of carbohydrates in this sample meal, follow the steps shown below. Sample meal 3 oz (85 g) chicken breast. ? cup or 4 oz (106 g) brown rice.  cup or 3 oz (85 g) corn. 1 cup or 8 fl oz (237 mL) milk. 1 cup or 5 oz (150 g) strawberries with sugar-free whipped topping. Carbohydrate calculation Identify the foods that contain carbohydrates: Rice. Corn. Milk. Strawberries. Calculate how many servings you have   of each food: 2 servings rice. 1 serving corn. 1 serving milk. 1 serving strawberries. Multiply each number of servings by 15 g: 2 servings rice x 15 g = 30 g. 1 serving corn x 15 g = 15 g. 1 serving milk x 15 g = 15 g. 1 serving strawberries x 15 g = 15 g. Add together all of the amounts to find the total grams of carbohydrates eaten: 30 g + 15 g + 15 g + 15 g = 75 g of carbohydrates total. What are tips for following this plan? Shopping Develop a meal plan and then make a shopping list. Buy fresh and frozen vegetables, fresh and frozen fruit, dairy, eggs, beans, lentils, and whole grains. Look at food labels. Choose foods that have more fiber and less sugar. Avoid processed foods and foods with added sugars. Meal planning Aim to have the same  number of grams of carbohydrates at each meal and for each snack time. Plan to have regular, balanced meals and snacks. Where to find more information American Diabetes Association: diabetes.org Centers for Disease Control and Prevention: cdc.gov Academy of Nutrition and Dietetics: eatright.org Association of Diabetes Care & Education Specialists: diabeteseducator.org Summary Carbohydrate counting is a method of keeping track of how many carbohydrates you eat. Eating carbohydrates increases the amount of sugar (glucose) in your blood. Counting how many carbohydrates you eat improves how well you manage your blood glucose. This helps you manage your diabetes. A dietitian can help you make a meal plan and calculate how many carbohydrates you should have at each meal and snack. This information is not intended to replace advice given to you by your health care provider. Make sure you discuss any questions you have with your health care provider. Document Revised: 02/09/2020 Document Reviewed: 02/09/2020 Elsevier Patient Education  2023 [employer] DASH Eating Plan DASH stands for Dietary Approaches to Stop Hypertension. The DASH eating plan is a healthy eating plan that has been shown to: Reduce high blood pressure (hypertension). Reduce your risk for type 2 diabetes, heart disease, and stroke. Help with weight loss. What are tips for following this plan? Reading food labels Check food labels for the amount of salt (sodium) per serving. Choose foods with less than 5 percent of the Daily Value of sodium. Generally, foods with less than 300 milligrams (mg) of sodium per serving fit into this eating plan. To find whole grains, look for the word "whole" as the first word in the ingredient list. Shopping Buy products labeled as "low-sodium" or "no salt added." Buy fresh foods. Avoid canned foods and pre-made or frozen meals. Cooking Avoid adding salt when cooking. Use salt-free seasonings or  herbs instead of table salt or sea salt. Check with your health care provider or pharmacist before using salt substitutes. Do not fry foods. Cook foods using healthy methods such as baking, boiling, grilling, roasting, and broiling instead. Cook with heart-healthy oils, such as olive, canola, avocado, soybean, or sunflower oil. Meal planning  Eat a balanced diet that includes: 4 or more servings of fruits and 4 or more servings of vegetables each day. Try to fill one-half of your plate with fruits and vegetables. 6-8 servings of whole grains each day. Less than 6 oz (170 g) of lean meat, poultry, or fish each day. A 3-oz (85-g) serving of meat is about the same size as a deck of cards. One egg equals 1 oz (28 g). 2-3 servings of low-fat dairy each day. One serving is 1   cup (237 mL). 1 serving of nuts, seeds, or beans 5 times each week. 2-3 servings of heart-healthy fats. Healthy fats called omega-3 fatty acids are found in foods such as walnuts, flaxseeds, fortified milks, and eggs. These fats are also found in cold-water fish, such as sardines, salmon, and mackerel. Limit how much you eat of: Canned or prepackaged foods. Food that is high in trans fat, such as some fried foods. Food that is high in saturated fat, such as fatty meat. Desserts and other sweets, sugary drinks, and other foods with added sugar. Full-fat dairy products. Do not salt foods before eating. Do not eat more than 4 egg yolks a week. Try to eat at least 2 vegetarian meals a week. Eat more home-cooked food and less restaurant, buffet, and fast food. Lifestyle When eating at a restaurant, ask that your food be prepared with less salt or no salt, if possible. If you drink alcohol: Limit how much you use to: 0-1 drink a day for women who are not pregnant. 0-2 drinks a day for men. Be aware of how much alcohol is in your drink. In the [place] one drink equals one 12 oz bottle of beer (355 mL), one 5 oz glass of wine (148  mL), or one 1 oz glass of hard liquor (44 mL). General information Avoid eating more than 2,300 mg of salt a day. If you have hypertension, you may need to reduce your sodium intake to 1,500 mg a day. Work with your health care provider to maintain a healthy body weight or to lose weight. Ask what an ideal weight is for you. Get at least 30 minutes of exercise that causes your heart to beat faster (aerobic exercise) most days of the week. Activities may include walking, swimming, or biking. Work with your health care provider or dietitian to adjust your eating plan to your individual calorie needs. What foods should I eat? Fruits All fresh, dried, or frozen fruit. Canned fruit in natural juice (without added sugar). Vegetables Fresh or frozen vegetables (raw, steamed, roasted, or grilled). Low-sodium or reduced-sodium tomato and vegetable juice. Low-sodium or reduced-sodium tomato sauce and tomato paste. Low-sodium or reduced-sodium canned vegetables. Grains Whole-grain or whole-wheat bread. Whole-grain or whole-wheat pasta. Brown rice. Oatmeal. Quinoa. Bulgur. Whole-grain and low-sodium cereals. Pita bread. Low-fat, low-sodium crackers. Whole-wheat flour tortillas. Meats and other proteins Skinless chicken or turkey. Ground chicken or turkey. Pork with fat trimmed off. Fish and seafood. Egg whites. Dried beans, peas, or lentils. Unsalted nuts, nut butters, and seeds. Unsalted canned beans. Lean cuts of beef with fat trimmed off. Low-sodium, lean precooked or cured meat, such as sausages or meat loaves. Dairy Low-fat (1%) or fat-free (skim) milk. Reduced-fat, low-fat, or fat-free cheeses. Nonfat, low-sodium ricotta or cottage cheese. Low-fat or nonfat yogurt. Low-fat, low-sodium cheese. Fats and oils Soft margarine without trans fats. Vegetable oil. Reduced-fat, low-fat, or light mayonnaise and salad dressings (reduced-sodium). Canola, safflower, olive, avocado, soybean, and sunflower oils.  Avocado. Seasonings and condiments Herbs. Spices. Seasoning mixes without salt. Other foods Unsalted popcorn and pretzels. Fat-free sweets. The items listed above may not be a complete list of foods and beverages you can eat. Contact a dietitian for more information. What foods should I avoid? Fruits Canned fruit in a light or heavy syrup. Fried fruit. Fruit in cream or butter sauce. Vegetables Creamed or fried vegetables. Vegetables in a cheese sauce. Regular canned vegetables (not low-sodium or reduced-sodium). Regular canned tomato sauce and paste (not low-sodium or reduced-sodium). Regular   tomato and vegetable juice (not low-sodium or reduced-sodium). Pickles. Olives. Grains Baked goods made with fat, such as croissants, muffins, or some breads. Dry pasta or rice meal packs. Meats and other proteins Fatty cuts of meat. Ribs. Fried meat. Bacon. Bologna, salami, and other precooked or cured meats, such as sausages or meat loaves. Fat from the back of a pig (fatback). Bratwurst. Salted nuts and seeds. Canned beans with added salt. Canned or smoked fish. Whole eggs or egg yolks. Chicken or turkey with skin. Dairy Whole or 2% milk, cream, and half-and-half. Whole or full-fat cream cheese. Whole-fat or sweetened yogurt. Full-fat cheese. Nondairy creamers. Whipped toppings. Processed cheese and cheese spreads. Fats and oils Butter. Stick margarine. Lard. Shortening. Ghee. Bacon fat. Tropical oils, such as coconut, palm kernel, or palm oil. Seasonings and condiments Onion salt, garlic salt, seasoned salt, table salt, and sea salt. Worcestershire sauce. Tartar sauce. Barbecue sauce. Teriyaki sauce. Soy sauce, including reduced-sodium. Steak sauce. Canned and packaged gravies. Fish sauce. Oyster sauce. Cocktail sauce. Store-bought horseradish. Ketchup. Mustard. Meat flavorings and tenderizers. Bouillon cubes. Hot sauces. Pre-made or packaged marinades. Pre-made or packaged taco seasonings. Relishes.  Regular salad dressings. Other foods Salted popcorn and pretzels. The items listed above may not be a complete list of foods and beverages you should avoid. Contact a dietitian for more information. Where to find more information National Heart, Lung, and Blood Institute: www.nhlbi.nih.gov American Heart Association: www.heart.org Academy of Nutrition and Dietetics: www.eatright.org National Kidney Foundation: www.kidney.org Summary The DASH eating plan is a healthy eating plan that has been shown to reduce high blood pressure (hypertension). It may also reduce your risk for type 2 diabetes, heart disease, and stroke. When on the DASH eating plan, aim to eat more fresh fruits and vegetables, whole grains, lean proteins, low-fat dairy, and heart-healthy fats. With the DASH eating plan, you should limit salt (sodium) intake to 2,300 mg a day. If you have hypertension, you may need to reduce your sodium intake to 1,500 mg a day. Work with your health care provider or dietitian to adjust your eating plan to your individual calorie needs. This information is not intended to replace advice given to you by your health care provider. Make sure you discuss any questions you have with your health care provider. Document Revised: 06/11/2019 Document Reviewed: 06/11/2019 Elsevier Patient Education  2023 [employer]  

## 2022-11-22 ENCOUNTER — Other Ambulatory Visit: Payer: Self-pay

## 2022-12-19 ENCOUNTER — Ambulatory Visit: Payer: Self-pay

## 2022-12-26 ENCOUNTER — Ambulatory Visit: Payer: Self-pay

## 2023-03-06 ENCOUNTER — Other Ambulatory Visit: Payer: Self-pay
